# Patient Record
Sex: Male | Born: 1970 | Race: Black or African American | Hispanic: No | Marital: Single | Smoking: Never smoker
Health system: Southern US, Community
[De-identification: ages and names within clinical notes are randomized; demographics above are authoritative.]

## PROBLEM LIST (undated history)

## (undated) DIAGNOSIS — J189 Pneumonia, unspecified organism: Secondary | ICD-10-CM

## (undated) DIAGNOSIS — C220 Liver cell carcinoma: Secondary | ICD-10-CM

## (undated) DIAGNOSIS — B1920 Unspecified viral hepatitis C without hepatic coma: Secondary | ICD-10-CM

## (undated) DIAGNOSIS — K7460 Unspecified cirrhosis of liver: Secondary | ICD-10-CM

## (undated) HISTORY — DX: Unspecified cirrhosis of liver: K74.60

## (undated) HISTORY — DX: Unspecified viral hepatitis C without hepatic coma: B19.20

## (undated) HISTORY — DX: Liver cell carcinoma: C22.0

## (undated) HISTORY — DX: Pneumonia, unspecified organism: J18.9

---

## 2007-11-25 ENCOUNTER — Ambulatory Visit: Payer: Self-pay | Admitting: Internal Medicine

## 2007-11-25 DIAGNOSIS — K029 Dental caries, unspecified: Secondary | ICD-10-CM | POA: Insufficient documentation

## 2007-11-27 ENCOUNTER — Encounter (INDEPENDENT_AMBULATORY_CARE_PROVIDER_SITE_OTHER): Payer: Self-pay | Admitting: Internal Medicine

## 2007-12-01 ENCOUNTER — Encounter (INDEPENDENT_AMBULATORY_CARE_PROVIDER_SITE_OTHER): Payer: Self-pay | Admitting: Internal Medicine

## 2009-08-19 ENCOUNTER — Emergency Department (HOSPITAL_COMMUNITY): Admission: EM | Admit: 2009-08-19 | Discharge: 2009-08-19 | Payer: Self-pay | Admitting: Emergency Medicine

## 2010-09-26 NOTE — Letter (Signed)
Summary: RECEIVED RECORDS FROM [facility]  RECEIVED RECORDS FROM [facility]   Imported By: Arta Bruce 04/13/2010 15:17:50  _____________________________________________________________________  External Attachment:    Type:   Image     Comment:   External Document

## 2010-11-27 LAB — BASIC METABOLIC PANEL WITH GFR
BUN: 8 mg/dL (ref 6–23)
CO2: 25 meq/L (ref 19–32)
Calcium: 8.2 mg/dL — ABNORMAL LOW (ref 8.4–10.5)
Chloride: 107 meq/L (ref 96–112)
Creatinine, Ser: 0.74 mg/dL (ref 0.4–1.5)
GFR calc non Af Amer: >60 mL/min
Glucose, Bld: 125 mg/dL — ABNORMAL HIGH (ref 70–99)
Potassium: 3.9 meq/L (ref 3.5–5.1)
Sodium: 142 meq/L (ref 135–145)

## 2010-11-27 LAB — URINALYSIS, ROUTINE W REFLEX MICROSCOPIC
Bilirubin Urine: NEGATIVE
Glucose, UA: NEGATIVE mg/dL
Hgb urine dipstick: NEGATIVE
Nitrite: NEGATIVE
Specific Gravity, Urine: 1.042 — ABNORMAL HIGH (ref 1.005–1.030)
pH: 5.5 (ref 5.0–8.0)

## 2010-11-27 LAB — CBC
Hemoglobin: 16.6 g/dL (ref 13.0–17.0)
MCHC: 34.8 g/dL (ref 30.0–36.0)
RBC: 5.18 MIL/uL (ref 4.22–5.81)

## 2010-11-27 LAB — POCT CARDIAC MARKERS
CKMB, poc: 4.9 ng/mL (ref 1.0–8.0)
Troponin i, poc: <0.05 ng/mL (ref 0.00–0.09)

## 2013-05-28 ENCOUNTER — Ambulatory Visit: Payer: BC Managed Care – PPO | Admitting: Family Medicine

## 2013-05-28 ENCOUNTER — Other Ambulatory Visit: Payer: Self-pay | Admitting: Family Medicine

## 2013-05-28 VITALS — BP 130/80 | HR 56 | Temp 98.0°F | Resp 16 | Ht 64.0 in | Wt 138.0 lb

## 2013-05-28 DIAGNOSIS — Z0000 Encounter for general adult medical examination without abnormal findings: Secondary | ICD-10-CM

## 2013-05-28 DIAGNOSIS — R7989 Other specified abnormal findings of blood chemistry: Secondary | ICD-10-CM

## 2013-05-28 LAB — LIPID PANEL
HDL: 78 mg/dL (ref >39)
LDL Cholesterol: 102 mg/dL — ABNORMAL HIGH (ref 0–99)
Total CHOL/HDL Ratio: 2.8 Ratio
VLDL: 37 mg/dL (ref 0–40)

## 2013-05-28 LAB — POCT CBC
HCT, POC: 51.1 % (ref 43.5–53.7)
Hemoglobin: 16.6 g/dL (ref 14.1–18.1)
Lymph, poc: 2.1 (ref 0.6–3.4)
MCH, POC: 31.7 pg — AB (ref 27–31.2)
MCHC: 32.5 g/dL (ref 31.8–35.4)
MCV: 97.7 fL — AB (ref 80–97)
POC MID %: 7.1 %M (ref 0–12)
WBC: 3.7 10*3/uL — AB (ref 4.6–10.2)

## 2013-05-28 LAB — COMPREHENSIVE METABOLIC PANEL
ALT: 125 U/L — ABNORMAL HIGH (ref 0–53)
Alkaline Phosphatase: 103 U/L (ref 39–117)
Sodium: 136 mEq/L (ref 135–145)
Total Bilirubin: 0.6 mg/dL (ref 0.3–1.2)
Total Protein: 8.2 g/dL (ref 6.0–8.3)

## 2013-05-28 NOTE — Progress Notes (Signed)
Annual physical examination:  42 year old man who is here for a physical examination. He has been in the [place] for about 6 years has not had a physical examination. He has no acute medical complaints  Past medical history: Major medical illnesses: None Regular medications: None Surgical illnesses: None Medical allergies: None   Family history: Mother was killed in the [place]. Father is still living, believe in [place].   Social history: Patient works at [employer]. He grew up in [place], lives in the [place], [place], and [place] before coming to the [place]. He is single and does not have children. He gets a lot of exercise at 2 jobs and working out some.  Review of systems: Constitutional: Unremarkable HEENT: Unremarkable Pressure: Unremarkable Cardiovascular: Unremarkable Genitourinary: Unremarkable Gastrointestinal: Unremarkable Muscle skeletal: Unremarkable Dermatologic: Unremarkable Endocrinologic: Neurologic: Unremarkable  Physical examination: Well-developed African American male in no acute distress. His TMs are normal. Eyes PERRLA. Fundi benign. Throat clear. Has some dental.. Neck supple without nodes thyromegaly. Chest is clear to auscultation. Heart regular without murmurs gallops or arrhythmias. And soft the mass or tenderness. Normal male external genitalia, and surface, no abnormalities noted. Testes descended. No hernias. Extremities unremarkable. Skin unremarkable.  Assessment: Normal physical examination  Plan: Check labs No other major advice or concerns today.         Results for orders placed in visit on 05/28/13  POCT CBC      Result Value Range   WBC 3.7 (*) 4.6 - 10.2 K/uL   Lymph, poc 2.1  0.6 - 3.4   POC LYMPH PERCENT 57.1 (*) 10 - 50 %L   MID (cbc) 0.3  0 - 0.9   POC MID % 7.1  0 - 12 %M   POC Granulocyte 1.3 (*) 2 - 6.9   Granulocyte percent 35.8 (*) 37 - 80 %G   RBC 5.23  4.69 - 6.13 M/uL   Hemoglobin 16.6   14.1 - 18.1 g/dL   HCT, POC 81.1  91.4 - 53.7 %   MCV 97.7 (*) 80 - 97 fL   MCH, POC 31.7 (*) 27 - 31.2 pg   MCHC 32.5  31.8 - 35.4 g/dL   RDW, POC 78.2     Platelet Count, POC 110 (*) 142 - 424 K/uL   MPV 10.2  0 - 99.8 fL

## 2013-05-28 NOTE — Patient Instructions (Signed)
I will either send you a letter or have someone call you regarding the rest of your laboratory tests.

## 2013-06-02 LAB — HEPATITIS B SURFACE ANTIGEN: Hepatitis B Surface Ag: NEGATIVE

## 2013-06-03 LAB — HEPATITIS A ANTIBODY, IGM: Hep A IgM: NEGATIVE

## 2013-06-03 LAB — HEPATITIS A ANTIBODY, TOTAL: Hep A Total Ab: NEGATIVE

## 2013-12-24 ENCOUNTER — Emergency Department (HOSPITAL_COMMUNITY)
Admission: EM | Admit: 2013-12-24 | Discharge: 2013-12-24 | Disposition: A | Discharge status: Home / Self Care | Payer: Self-pay | Attending: Emergency Medicine | Admitting: Emergency Medicine

## 2013-12-24 ENCOUNTER — Encounter (HOSPITAL_COMMUNITY): Payer: Self-pay | Admitting: Emergency Medicine

## 2013-12-24 DIAGNOSIS — R6889 Other general symptoms and signs: Secondary | ICD-10-CM

## 2013-12-24 DIAGNOSIS — S01502A Unspecified open wound of oral cavity, initial encounter: Secondary | ICD-10-CM | POA: Insufficient documentation

## 2013-12-24 DIAGNOSIS — M545 Low back pain, unspecified: Secondary | ICD-10-CM | POA: Insufficient documentation

## 2013-12-24 DIAGNOSIS — W503XXA Accidental bite by another person, initial encounter: Secondary | ICD-10-CM | POA: Insufficient documentation

## 2013-12-24 DIAGNOSIS — Y9289 Other specified places as the place of occurrence of the external cause: Secondary | ICD-10-CM | POA: Insufficient documentation

## 2013-12-24 DIAGNOSIS — G259 Extrapyramidal and movement disorder, unspecified: Secondary | ICD-10-CM | POA: Insufficient documentation

## 2013-12-24 DIAGNOSIS — R51 Headache: Secondary | ICD-10-CM | POA: Insufficient documentation

## 2013-12-24 DIAGNOSIS — M549 Dorsalgia, unspecified: Secondary | ICD-10-CM

## 2013-12-24 DIAGNOSIS — Q759 Congenital malformation of skull and face bones, unspecified: Secondary | ICD-10-CM | POA: Insufficient documentation

## 2013-12-24 DIAGNOSIS — Y9389 Activity, other specified: Secondary | ICD-10-CM | POA: Insufficient documentation

## 2013-12-24 LAB — CBC WITH DIFFERENTIAL/PLATELET
BASOS ABS: 0 10*3/uL (ref 0.0–0.1)
Basophils Relative: 0 % (ref 0–1)
EOS ABS: 0 10*3/uL (ref 0.0–0.7)
EOS PCT: 0 % (ref 0–5)
HEMATOCRIT: 41.6 % (ref 39.0–52.0)
Hemoglobin: 14.8 g/dL (ref 13.0–17.0)
LYMPHS ABS: 1.4 10*3/uL (ref 0.7–4.0)
LYMPHS PCT: 43 % (ref 12–46)
MCH: 30.8 pg (ref 26.0–34.0)
MCHC: 35.6 g/dL (ref 30.0–36.0)
MCV: 86.5 fL (ref 78.0–100.0)
MONO ABS: 0.3 10*3/uL (ref 0.1–1.0)
Monocytes Relative: 8 % (ref 3–12)
Neutro Abs: 1.6 10*3/uL — ABNORMAL LOW (ref 1.7–7.7)
Neutrophils Relative %: 48 % (ref 43–77)
Platelets: 113 10*3/uL — ABNORMAL LOW (ref 150–400)
RBC: 4.81 MIL/uL (ref 4.22–5.81)
RDW: 13.1 % (ref 11.5–15.5)
WBC: 3.3 10*3/uL — AB (ref 4.0–10.5)

## 2013-12-24 LAB — BASIC METABOLIC PANEL
BUN: 13 mg/dL (ref 6–23)
CO2: 25 mEq/L (ref 19–32)
Calcium: 9.4 mg/dL (ref 8.4–10.5)
Chloride: 96 mEq/L (ref 96–112)
Creatinine, Ser: 0.63 mg/dL (ref 0.50–1.35)
GFR calc Af Amer: >90 mL/min (ref >90)
GFR calc non Af Amer: >90 mL/min (ref >90)
Glucose, Bld: 117 mg/dL — ABNORMAL HIGH (ref 70–99)
Potassium: 4.4 mEq/L (ref 3.7–5.3)
Sodium: 133 mEq/L — ABNORMAL LOW (ref 137–147)

## 2013-12-24 LAB — URINALYSIS, ROUTINE W REFLEX MICROSCOPIC
Bilirubin Urine: NEGATIVE
Glucose, UA: NEGATIVE mg/dL
Hgb urine dipstick: NEGATIVE
Ketones, ur: NEGATIVE mg/dL
Leukocytes, UA: NEGATIVE
Nitrite: NEGATIVE
Protein, ur: NEGATIVE mg/dL
Specific Gravity, Urine: 1.015 (ref 1.005–1.030)
Urobilinogen, UA: 1 mg/dL (ref 0.0–1.0)
pH: 6.5 (ref 5.0–8.0)

## 2013-12-24 MED ORDER — SODIUM CHLORIDE 0.9 % IV BOLUS (SEPSIS)
500.0000 mL | Freq: Once | INTRAVENOUS | Status: AC
  Administered 2013-12-24: 500 mL via INTRAVENOUS

## 2013-12-24 MED ORDER — DIPHENHYDRAMINE HCL 25 MG PO TABS: 50.0000 mg | ORAL_TABLET | ORAL | Status: DC | PRN

## 2013-12-24 MED ORDER — IBUPROFEN 800 MG PO TABS: 800.0000 mg | ORAL_TABLET | Freq: Three times a day (TID) | ORAL | Status: DC

## 2013-12-24 MED ORDER — DIPHENHYDRAMINE HCL 50 MG/ML IJ SOLN
50.0000 mg | Freq: Once | INTRAMUSCULAR | Status: AC
  Administered 2013-12-24: 50 mg via INTRAVENOUS
  Filled 2013-12-24: qty 1

## 2013-12-24 NOTE — ED Notes (Signed)
Bed: UY40 Expected date:  Expected time:  Means of arrival:  Comments: EMS- back pain, seizure like activity

## 2013-12-24 NOTE — Discharge Instructions (Signed)
Back Pain, Adult Low back pain is very common. About 1 in 5 people have back pain.The cause of low back pain is rarely dangerous. The pain often gets better over time.About half of people with a sudden onset of back pain feel better in just 2 weeks. About 8 in 10 people feel better by 6 weeks.  CAUSES Some common causes of back pain include:  Strain of the muscles or ligaments supporting the spine.  Wear and tear (degeneration) of the spinal discs.  Arthritis.  Direct injury to the back. DIAGNOSIS Most of the time, the direct cause of low back pain is not known.However, back pain can be treated effectively even when the exact cause of the pain is unknown.Answering your caregiver's questions about your overall health and symptoms is one of the most accurate ways to make sure the cause of your pain is not dangerous. If your caregiver needs more information, he or she may order lab work or imaging tests (X-rays or MRIs).However, even if imaging tests show changes in your back, this usually does not require surgery. HOME CARE INSTRUCTIONS For many people, back pain returns.Since low back pain is rarely dangerous, it is often a condition that people can learn to [facility] their own.   Remain active. It is stressful on the back to sit or stand in one place. Do not sit, drive, or stand in one place for more than 30 minutes at a time. Take short walks on level surfaces as soon as pain allows.Try to increase the length of time you walk each day.  Do not stay in bed.Resting more than 1 or 2 days can delay your recovery.  Do not avoid exercise or work.Your body is made to move.It is not dangerous to be active, even though your back may hurt.Your back will likely heal faster if you return to being active before your pain is gone.  Pay attention to your body when you bend and lift. Many people have less discomfortwhen lifting if they bend their knees, keep the load close to their bodies,and  avoid twisting. Often, the most comfortable positions are those that put less stress on your recovering back.  Find a comfortable position to sleep. Use a firm mattress and lie on your side with your knees slightly bent. If you lie on your back, put a pillow under your knees.  Only take over-the-counter or prescription medicines as directed by your caregiver. Over-the-counter medicines to reduce pain and inflammation are often the most helpful.Your caregiver may prescribe muscle relaxant drugs.These medicines help dull your pain so you can more quickly return to your normal activities and healthy exercise.  Put ice on the injured area.  Put ice in a plastic bag.  Place a towel between your skin and the bag.  Leave the ice on for 15-20 minutes, 03-04 times a day for the first 2 to 3 days. After that, ice and heat may be alternated to reduce pain and spasms.  Ask your caregiver about trying back exercises and gentle massage. This may be of some benefit.  Avoid feeling anxious or stressed.Stress increases muscle tension and can worsen back pain.It is important to recognize when you are anxious or stressed and learn ways to manage it.Exercise is a great option. SEEK MEDICAL CARE IF:  You have pain that is not relieved with rest or medicine.  You have pain that does not improve in 1 week.  You have new symptoms.  You are generally not feeling well. SEEK  IMMEDIATE MEDICAL CARE IF:   You have pain that radiates from your back into your legs.  You develop new bowel or bladder control problems.  You have unusual weakness or numbness in your arms or legs.  You develop nausea or vomiting.  You develop abdominal pain.  You feel faint. Document Released: 08/13/2005 Document Revised: 02/12/2012 Document Reviewed: 01/01/2011 [facility] Patient Information 2014 [place], [place].  Back Pain, Adulte La lombalgie est trs commun. Environ 1 personne sur 5 ont des PepsiCo. La cause de  douleurs au bas du dos est rarement dangereux. La douleur est souvent mieux au fil du [place]. Environ la moiti des personnes atteintes de l'apparition soudaine de maux de dos se sentir mieux en seulement 2 semaines. Environ 8 personnes sur 10 se sentent mieux en 6 semaines. CAUSES Certaines des causes frquentes de Intel Corporation comprennent: Souche de muscles ou des ligaments soutenant la colonne vertbrale. L'usure (dgnrescence) des disques intervertbraux. Arthrite. Atteinte directe  l'arrire. DIAGNOSTIC La plupart du temps, la cause directe de la lombalgie est pas connue. Toutefois, les maux de The Pepsi tre traite efficacement mme lorsque la cause exacte de la douleur est inconnue. Rpondre aux questions de votre fournisseur de soins de votre sant et symptmes globale est l'un des Yahoo! Inc plus prcis pour Boeing la cause de votre douleur est pas dangereux. Si votre fournisseur de soins a besoin de plus d'informations, il ou elle peut ordonner des travaux ou d'imagerie des tests de laboratoire (de rayons X ou [place]). Cependant, mme si les tests d'imagerie montrent des TEPPCO Partners, ce ne ncessite gnralement pas Scientist, product/process development. INSTRUCTIONS DE SOINS  DOMICILE Pour beaucoup de gens, dos douleur revient. Depuis la lombalgie est rarement dangereux, il est souvent une condition que les gens peuvent apprendre  grer eux-mmes. Restez actif. Il est stressant sur Alcoa Inc rester assis ou debout en un seul endroit. Ne vous asseyez pas, conduire, ou debout dans un endroit pendant plus de 30 minutes  une heure. Prenez de courtes promenades sur des surfaces de niveau ds que la Risk analyst. Essayez d'augmenter la longueur de temps que vous marchez chaque jour. Ne pas rester au lit. Reposant plus de 1 ou 2 jours peut retarder votre rtablissement. Ne pas viter l'exercice ou de travail. Votre corps est fait pour Danaher Corporation. Il est pas dangereux d'tre actif,  mme si votre dos peut blesser. Votre dos sera probablement gurir plus vite si vous revenez  tre Microbiologist a disparu. Faites attention  General Electric corps lorsque vous vous penchez et Economist. Beaucoup de gens ont moins d'inconfort lors de la leve si elles plier les genoux, gardez la charge prs de PG&E Corporation, et d'viter la torsion. Souvent, les positions les plus confortables sont ceux qui mettent moins l'accent sur la rcupration [place]. Trouver une position confortable pour dormir. Utilisez un matelas ferme et se Higher education careers adviser ct, les genoux lgrement plis. Si vous allongez Dollar General, Therapist, occupational un oreiller sous vos genoux. Seulement prendre over-the-counter ou des Raytheon ordonnance comme indiqu par votre fournisseur de [place]. Over-the-counter mdicaments pour rduire Engineer, technical sales et l'inflammation Monsanto Company plus The Kroger. Votre soignant peut prescrire des mdicaments myorelaxants. Ces mdicaments aident terne votre douleur afin que vous puissiez plus rapidement retourner  vos activits normales et exercice sain. Mettre de la Peabody Energy zone [place]. Mettre de Education officer, community un sac plastique. Placez une serviette entre votre peau et  le sac. Laissez la glace pendant 15-20 minutes, 03-04 fois par jour pendant les 2  3 premiers jours. Aprs cela, la glace et la Computer Sciences Corporation tre alterns pour rduire SunTrust et les spasmes. Demandez  votre fournisseur de Oncologist pour Qwest Communications et un massage doux. Cela peut tre d'une certaine utilit. viter de se sentir anxieux ou stress. Le stress augmente la tension musculaire et Ecologist PepsiCo. Il est important de reconnatre quand vous tes anxieux ou stress et apprenez faons de Medical sales representative. L'exercice est une excellente option. Obtenir des soins si: Vous avez des douleurs qui ne sont pas soulage par le repos ou la mdecine. Vous avez des douleurs qui ne amliore pas en 1  semaine. Vous avez de nouveaux symptmes. Vous tes gnralement sentez pas bien. Consulter immdiatement SOINS MDICAUX SI: Vous avez une douleur qui irradie  partir de votre retour dans vos [place]. Vous dveloppez de nouveaux problmes intestinaux ou de contrle de la vessie. Vous avez une faiblesse inhabituelle ou engourdissements dans les bras ou les [place]. Vous dveloppez des [employer] Solutions ou des vomissements. Vous dveloppez des Chesapeake Energy. Vous vous sentez faible. Google Translate for [place]

## 2013-12-24 NOTE — ED Notes (Signed)
Used interpreter phone, pt here bc something in his brain is making him "eat his tongue" and he wants to know what is making him do this.

## 2013-12-24 NOTE — ED Notes (Signed)
Per EMS pt comes from home c/o flank pain since yesterday, headache started today every 30 mins on sides of his head and he cant talk. Pt also has laceration on tongue but not sure when or how that got there.  Pt speaks very little, broken [place]. Pt speaks [place].

## 2013-12-24 NOTE — ED Provider Notes (Signed)
CSN: [id number]     Arrival date & time 12/24/13  1320 History   First MD Initiated Contact with Patient 12/24/13 1338     Chief Complaint  Patient presents with  . Flank Pain  . Headache  . Laceration    tongue     (Consider location/radiation/quality/duration/timing/severity/associated sxs/prior Treatment) HPI Comments: Patient presents to the ER with complaints of spasms around his mouth. Patient reports that he started having back pain yesterday. He took a pain medication for the back yesterday and then again today. After he took the second dose he started noticing that his mouth and jaw were locking up. He has been biting his tongue. Back pain is mild to moderate, constant and worsens with movement. He has never had the facial spasms before.  Patient is a 43 y.o. male presenting with flank pain, headaches, and skin laceration. The history is limited by a language barrier. A language interpreter was used.  Flank Pain Associated symptoms include headaches.  Headache Laceration   History reviewed. No pertinent past medical history. History reviewed. No pertinent past surgical history. No family history on file. History  Substance Use Topics  . Smoking status: Never Smoker   . Smokeless tobacco: Not on file  . Alcohol Use: Yes     Comment: social     Review of Systems  HENT: Negative for dental problem.        Facial pain  Genitourinary: Positive for flank pain.  Neurological: Positive for headaches.  All other systems reviewed and are negative.     Allergies  Review of patient's allergies indicates no known allergies.  Home Medications   Prior to Admission medications   Not on File   BP 145/80  Pulse 72  Temp(Src) 98.2 F (36.8 C) (Oral)  Resp 16  SpO2 99% Physical Exam  Constitutional: He is oriented to person, place, and time. He appears well-developed and well-nourished. He appears distressed.  HENT:  Head: Atraumatic. Macrocephalic.  Right Ear:  Hearing normal.  Left Ear: Hearing normal.  Nose: Nose normal.  Mouth/Throat: Oropharynx is clear and moist and mucous membranes are normal.  Eyes: Conjunctivae and EOM are normal. Pupils are equal, round, and reactive to light.  Neck: Normal range of motion. Neck supple.  Cardiovascular: Regular rhythm, S1 normal and S2 normal.  Exam reveals no gallop and no friction rub.   No murmur heard. Pulmonary/Chest: Effort normal and breath sounds normal. No respiratory distress. He exhibits no tenderness.  Abdominal: Soft. Normal appearance and bowel sounds are normal. There is no hepatosplenomegaly. There is no tenderness. There is no rebound, no guarding, no tenderness at McBurney's point and negative Murphy's sign. No hernia.  Musculoskeletal: Normal range of motion.  Neurological: He is alert and oriented to person, place, and time. He has normal strength. No cranial nerve deficit or sensory deficit. Coordination normal. GCS eye subscore is 4. GCS verbal subscore is 5. GCS motor subscore is 6.  Skin: Skin is warm, dry and intact. No rash noted. No cyanosis.  Psychiatric: He has a normal mood and affect. His speech is normal and behavior is normal. Thought content normal.    ED Course  Procedures (including critical care time) Labs Review Labs Reviewed  CBC WITH DIFFERENTIAL - Abnormal; Notable for the following:    WBC 3.3 (*)    Neutro Abs 1.6 (*)    All other components within normal limits  URINALYSIS, ROUTINE W REFLEX MICROSCOPIC  BASIC METABOLIC PANEL    Imaging  Review No results found.   EKG Interpretation None      MDM   Final diagnoses:  None   Patient presents to the ER with severe spasm of the muscles of his face after taking an unknown medication. His examination was consistent with extrapyramidal complications. Patient was admitted IV fluids and Benadryl with resolution of symptoms. Electrolytes and remainder of the workup was unremarkable.  Patient's back pain is  of unclear etiology, but not concerning. He has normal strength, sensation reflexes in the lower extremities. Will prescribe appropriate medication for use, rest, limited lifting.    Orpah Greek, MD 12/24/13 [phone]

## 2013-12-24 NOTE — ED Notes (Signed)
Pt will start making loud noises and get anxious and restless in the bed.  Pt also has like lock jaw and started chewing on his tongue and teeth grinding. Then after few minutes it will cease and pt will relax.

## 2018-12-23 ENCOUNTER — Encounter (HOSPITAL_COMMUNITY): Payer: Self-pay

## 2018-12-23 ENCOUNTER — Ambulatory Visit (HOSPITAL_COMMUNITY)
Admission: EM | Admit: 2018-12-23 | Discharge: 2018-12-23 | Disposition: A | Discharge status: Home / Self Care | Payer: Self-pay | Attending: Family Medicine | Admitting: Family Medicine

## 2018-12-23 ENCOUNTER — Other Ambulatory Visit: Payer: Self-pay

## 2018-12-23 DIAGNOSIS — R112 Nausea with vomiting, unspecified: Secondary | ICD-10-CM

## 2018-12-23 MED ORDER — ONDANSETRON 4 MG PO TBDP
ORAL_TABLET | ORAL | Status: AC
  Filled 2018-12-23: qty 1

## 2018-12-23 MED ORDER — ONDANSETRON 4 MG PO TBDP: 4.0000 mg | ORAL_TABLET | Freq: Three times a day (TID) | ORAL | 0 refills | Status: DC | PRN

## 2018-12-23 MED ORDER — ONDANSETRON 4 MG PO TBDP
4.0000 mg | ORAL_TABLET | Freq: Once | ORAL | Status: AC
  Administered 2018-12-23: 19:00:00 4 mg via ORAL

## 2018-12-23 NOTE — ED Triage Notes (Signed)
Pt speaks poor english but states he threw up earlier today

## 2018-12-23 NOTE — ED Provider Notes (Signed)
[facility]    CSN: [id number] Arrival date & time: 12/23/18  1750     History   Chief Complaint Chief Complaint  Patient presents with  . Emesis    HPI Patrick Tran interpretation via [place] is a 48 y.o. male. No contributing PMH presenting today for evaluation of nausea and vomiting. Symptoms began today. He has had difficulty holding food down despite having an appetite still. Denies associated abdominal pain, diarrhea or constipation. Denies associated URI symptoms. Denies cough, shortness of breath or chest discomfort. Has also had hot/cold chills and sweating. Subjective fevers, no measured fever. Denies sick close contact or with similar symptoms. He has felt slightly dizzy. Denies headache, vision changes.   Works for [employer].   HPI  History reviewed. No pertinent past medical history.  Patient Active Problem List   Diagnosis Date Noted  . DENTAL CARIES 11/25/2007    History reviewed. No pertinent surgical history.     Home Medications    Prior to Admission medications   Medication Sig Start Date End Date Taking? Authorizing Provider  diphenhydrAMINE (BENADRYL) 25 MG tablet Take 2 tablets (50 mg total) by mouth every 4 (four) hours as needed (facial spasm). 12/24/13   Orpah Greek, MD  ibuprofen (ADVIL,MOTRIN) 800 MG tablet Take 1 tablet (800 mg total) by mouth 3 (three) times daily. 12/24/13   Orpah Greek, MD  ondansetron (ZOFRAN ODT) 4 MG disintegrating tablet Take 1 tablet (4 mg total) by mouth every 8 (eight) hours as needed for nausea or vomiting. 12/23/18   Wieters, Elesa Hacker, PA-C    Family History Family History  Family history unknown: Yes    Social History Social History   Tobacco Use  . Smoking status: Never Smoker  Substance Use Topics  . Alcohol use: Yes    Comment: social   . Drug use: No     Allergies   Patient has no known allergies.   Review of Systems Review of  Systems  Constitutional: Positive for chills. Negative for activity change, appetite change, fatigue and fever.  HENT: Negative for congestion, ear pain, rhinorrhea, sinus pressure, sore throat and trouble swallowing.   Eyes: Negative for discharge and redness.  Respiratory: Negative for cough, chest tightness and shortness of breath.   Cardiovascular: Negative for chest pain.  Gastrointestinal: Positive for nausea and vomiting. Negative for abdominal pain and diarrhea.  Musculoskeletal: Negative for myalgias.  Skin: Negative for rash.  Neurological: Positive for dizziness. Negative for light-headedness and headaches.     Physical Exam Triage Vital Signs ED Triage Vitals  Enc Vitals Group     BP 12/23/18 1809 (!) 150/83     Pulse Rate 12/23/18 1809 64     Resp --      Temp 12/23/18 1809 97.6 F (36.4 C)     Temp src --      SpO2 12/23/18 1809 100 %     Weight --      Height --      Head Circumference --      Peak Flow --      Pain Score 12/23/18 1810 0     Pain Loc --      Pain Edu? --      Excl. in [place]? --    No data found.  Updated Vital Signs BP (!) 150/83   Pulse 64   Temp 97.6 F (36.4 C)   SpO2 100%   Visual Acuity Right Eye Distance:  Left Eye Distance:   Bilateral Distance:    Right Eye Near:   Left Eye Near:    Bilateral Near:     Physical Exam Vitals signs and nursing note reviewed.  Constitutional:      Appearance: He is well-developed.     Comments: Patient appears uncomfortable and diaphoretic. At end of visit he appeared more comfortable and in no acute distress.   HENT:     Head: Normocephalic and atraumatic.     Mouth/Throat:     Comments: Oral mucosa pink and moist, no tonsillar enlargement or exudate. Posterior pharynx patent and nonerythematous, no uvula deviation or swelling. Normal phonation.  Eyes:     Conjunctiva/sclera: Conjunctivae normal.  Neck:     Musculoskeletal: Neck supple.  Cardiovascular:     Rate and Rhythm: Normal  rate and regular rhythm.     Heart sounds: No murmur.  Pulmonary:     Effort: Pulmonary effort is normal. No respiratory distress.     Breath sounds: Normal breath sounds.     Comments: Breathing comfortably at rest, CTABL, no wheezing, rales or other adventitious sounds auscultated Abdominal:     Palpations: Abdomen is soft.     Tenderness: There is no abdominal tenderness.     Comments: Soft, nondistended, nontender to light and deep palpation throughout abdomen, no masses palpated.   Skin:    General: Skin is warm and dry.  Neurological:     Mental Status: He is alert.      UC Treatments / Results  Labs (all labs ordered are listed, but only abnormal results are displayed) Labs Reviewed - No data to display  EKG None  Radiology No results found.  Procedures Procedures (including critical care time)  Medications Ordered in UC Medications  ondansetron (ZOFRAN-ODT) disintegrating tablet 4 mg (4 mg Oral Given 12/23/18 1851)    Initial Impression / Assessment and Plan / UC Course  I have reviewed the triage vital signs and the nursing notes.  Pertinent labs & imaging results that were available during my care of the patient were reviewed by me and considered in my medical decision making (see chart for details).     Zofran provided in clinic which significantly improved nausea and discomfort prior to discharge. No associated URI symptoms at this time suggesting COVID, but day 1 of symptoms. Likely viral, possible gastroenteritis. Do not suspect underlying cardiac etiology, neg risk factors and no chest pain. Recommending symptomatic and supportive care with close monitoring. Zofran prescribed, push fluids. Monitor for development of fever, abdominal pain, dehydration signs, cough/shortness of breath. Discussed strict return precautions. Patient verbalized understanding and is agreeable with plan.'  Final Clinical Impressions(s) / UC Diagnoses   Final diagnoses:   Non-intractable vomiting with nausea, unspecified vomiting type     Discharge Instructions     .Your nausea, vomiting, and diarrhea appear to have a viral cause. Your symptoms should improve over the next week as your body continues to rid the infectious cause.  For nausea: Zofran prescribed. Begin with every 6 hours, than as you are able to hold food down, take it as needed. Start with clear liquids, then move to plain foods like bananas, rice, applesauce, toast, broth, grits, oatmeal. As those food settle okay you may transition to your normal foods. Avoid spicy and greasy foods as much as possible.  For Diarrhea: This is your body's natural way of getting rid of a virus. You may try taking 1 imodium to decrease amount of stools  a day, but we do not want you to stop your diarrhea.   Preventing dehydration is key! You need to replace the fluid your body is expelling. Drink plenty of fluids, may use Pedialyte or sports drinks.   Please return if you are experiencing blood in your vomit or stool or experiencing dizziness, lightheadedness, extreme fatigue, increased abdominal pain.     ED Prescriptions    Medication Sig Dispense Auth. Provider   ondansetron (ZOFRAN ODT) 4 MG disintegrating tablet Take 1 tablet (4 mg total) by mouth every 8 (eight) hours as needed for nausea or vomiting. 20 tablet Wieters, [place] C, PA-C     Controlled Substance Prescriptions [place] Controlled Substance Registry consulted? Not Applicable   Patrick Tran, [place] 12/24/18 1043

## 2018-12-23 NOTE — Discharge Instructions (Addendum)
.  Your nausea, vomiting, and diarrhea appear to have a viral cause. Your symptoms should improve over the next week as your body continues to rid the infectious cause.  For nausea: Zofran prescribed. Begin with every 6 hours, than as you are able to hold food down, take it as needed. Start with clear liquids, then move to plain foods like bananas, rice, applesauce, toast, broth, grits, oatmeal. As those food settle okay you may transition to your normal foods. Avoid spicy and greasy foods as much as possible.  For Diarrhea: This is your body's natural way of getting rid of a virus. You may try taking 1 imodium to decrease amount of stools a day, but we do not want you to stop your diarrhea.   Preventing dehydration is key! You need to replace the fluid your body is expelling. Drink plenty of fluids, may use Pedialyte or sports drinks.   Please return if you are experiencing blood in your vomit or stool or experiencing dizziness, lightheadedness, extreme fatigue, increased abdominal pain.

## 2019-01-13 ENCOUNTER — Encounter (HOSPITAL_COMMUNITY): Payer: Self-pay | Admitting: Emergency Medicine

## 2019-01-13 ENCOUNTER — Other Ambulatory Visit: Payer: Self-pay

## 2019-01-13 ENCOUNTER — Ambulatory Visit (HOSPITAL_COMMUNITY)
Admission: EM | Admit: 2019-01-13 | Discharge: 2019-01-13 | Disposition: A | Discharge status: Home / Self Care | Payer: PRIVATE HEALTH INSURANCE | Attending: Family Medicine | Admitting: Family Medicine

## 2019-01-13 DIAGNOSIS — M545 Low back pain, unspecified: Secondary | ICD-10-CM

## 2019-01-13 NOTE — ED Triage Notes (Signed)
Pt here for back pain and needs work note

## 2019-01-13 NOTE — ED Provider Notes (Signed)
[place]   [id number] 01/13/19 Arrival Time: [place]:  1. Acute bilateral low back pain without sciatica    Pain resolving. Supposed to return to work on 01/16/2019; work note provided.  Able to ambulate here and hemodynamically stable. No indication for imaging of back at this time given no trauma and normal neurological exam.   OTC analgesics if needed. Encourage ROM/movement as tolerated.  Follow-up Information    [facility].   Specialty:  Urgent Care Why:  As needed. Contact information: [place] [phone]          Reviewed expectations re: course of current medical issues. Questions answered. Outlined signs and symptoms indicating need for more acute intervention. Patient verbalized understanding. After Visit Summary given.   SUBJECTIVE: History from: patient.  Audio interpreter used but poor connection quality.  Trendon Dripps is a 48 y.o. male who presents with complaint of intermittent bilateral lower back discomfort. Onset gradual, over the past week. Injury/trama: no, but is very active at work and questions relation; works at [employer]. History of back problems: rare. Discomfort described as aching without radiation. Pain worse after prolonged standing; better with rest. Progressive LE weakness or saddle anesthesia: none. Extremity sensation changes or weakness: none. Ambulatory without difficulty. Normal bowel/bladder habits: yes, without urinary retention. No associated abdominal pain/n/v. Self treatment: has tried nothing for pain relief.  ROS: As per HPI. All other systems negative.   OBJECTIVE:  Vitals:   01/13/19 1744  BP: (!) 179/101  Pulse: 97  Resp: 18  Temp: 98 F (36.7 C)  TempSrc: Oral  SpO2: 97%    General appearance: alert; no distress Neck: supple with FROM; without midline tenderness CV: RRR Lungs: unlabored  respirations; symmetrical air entry Abdomen: soft, non-tender; non-distended Back: mild bilateral tenderness of his lower paraspinal musculature; FROM at waist; bruising: none; without midline tenderness Extremities: no edema; symmetrical with no gross deformities; normal ROM of bilateral lower extremities Skin: warm and dry Neurologic: normal gait; normal reflexes of RLE and LLE; normal sensation of RLE and LLE; normal strength of RLE and LLE Psychological: alert and cooperative; normal mood and affect  No Known Allergies   Social History   Socioeconomic History  . Marital status: Single    Spouse name: Not on file  . Number of children: Not on file  . Years of education: Not on file  . Highest education level: Not on file  Occupational History  . Not on file  Social Needs  . Financial resource strain: Not on file  . Food insecurity:    Worry: Not on file    Inability: Not on file  . Transportation needs:    Medical: Not on file    Non-medical: Not on file  Tobacco Use  . Smoking status: Never Smoker  Substance and Sexual Activity  . Alcohol use: Yes    Comment: social   . Drug use: No  . Sexual activity: Yes  Lifestyle  . Physical activity:    Days per week: Not on file    Minutes per session: Not on file  . Stress: Not on file  Relationships  . Social connections:    Talks on phone: Not on file    Gets together: Not on file    Attends religious service: Not on file    Active member of club or organization: Not on file    Attends meetings of  clubs or organizations: Not on file    Relationship status: Not on file  . Intimate partner violence:    Fear of current or ex partner: Not on file    Emotionally abused: Not on file    Physically abused: Not on file    Forced sexual activity: Not on file  Other Topics Concern  . Not on file  Social History Narrative  . Not on file   Family History  Family history unknown: Yes   History reviewed. No pertinent  surgical history.   Vanessa Kick, MD 01/14/19 [phone]

## 2019-01-26 ENCOUNTER — Ambulatory Visit (HOSPITAL_COMMUNITY)
Admission: EM | Admit: 2019-01-26 | Discharge: 2019-01-26 | Disposition: A | Discharge status: Home / Self Care | Payer: PRIVATE HEALTH INSURANCE | Attending: Family Medicine | Admitting: Family Medicine

## 2019-01-26 ENCOUNTER — Encounter (HOSPITAL_COMMUNITY): Payer: Self-pay | Admitting: Emergency Medicine

## 2019-01-26 ENCOUNTER — Other Ambulatory Visit: Payer: Self-pay

## 2019-01-26 DIAGNOSIS — G8929 Other chronic pain: Secondary | ICD-10-CM | POA: Diagnosis not present

## 2019-01-26 DIAGNOSIS — M546 Pain in thoracic spine: Secondary | ICD-10-CM

## 2019-01-26 DIAGNOSIS — S39012A Strain of muscle, fascia and tendon of lower back, initial encounter: Secondary | ICD-10-CM | POA: Diagnosis not present

## 2019-01-26 NOTE — ED Triage Notes (Signed)
Patient has had back pain since 5/19.  Patient is here today for a work note.  Back is hurting

## 2019-01-26 NOTE — ED Provider Notes (Signed)
[facility]    CSN: [id number] Arrival date & time: 01/26/19  1556     History   Chief Complaint Chief Complaint  Patient presents with  . Letter for School/Work  . Back Pain    HPI Patrick Tran is a 48 y.o. male.   Patient has had back pain since 5/19.  Patient is here today for a work note.  Back is hurting.  He has not been sleeping well and he has diffuse thoracic aching and paralumbar aching.  Originally from [place].  He works as a Geophysicist/field seismologist.   Note from 01/13/2019: Patrick Tran is a 48 y.o. male who presents with complaint of intermittent bilateral lower back discomfort. Onset gradual, over the past week. Injury/trama: no, but is very active at work and questions relation; works at [employer]. History of back problems: rare. Discomfort described as aching without radiation. Pain worse after prolonged standing; better with rest. Progressive LE weakness or saddle anesthesia: none. Extremity sensation changes or weakness: none. Ambulatory without difficulty. Normal bowel/bladder habits: yes, without urinary retention. No associated abdominal pain/n/v. Self treatment: has tried nothing for pain relief.      History reviewed. No pertinent past medical history.  Patient Active Problem List   Diagnosis Date Noted  . DENTAL CARIES 11/25/2007    History reviewed. No pertinent surgical history.     Home Medications    Prior to Admission medications   Not on File    Family History Family History  Family history unknown: Yes    Social History Social History   Tobacco Use  . Smoking status: Never Smoker  Substance Use Topics  . Alcohol use: Yes    Comment: social   . Drug use: No     Allergies   Patient has no known allergies.   Review of Systems Review of Systems   Physical Exam Triage Vital Signs ED Triage Vitals [01/26/19 1612]  Enc Vitals Group     BP      Pulse      Resp      Temp      Temp src      SpO2    Weight      Height      Head Circumference      Peak Flow      Pain Score 6     Pain Loc      Pain Edu?      Excl. in [place]?    No data found.  Updated Vital Signs BP 134/73 (BP Location: Left Arm)   Pulse 63   Temp 98.5 F (36.9 C) (Oral)   Resp 18   SpO2 97%    Physical Exam Vitals signs and nursing note reviewed.  Constitutional:      Appearance: Normal appearance.  Eyes:     Conjunctiva/sclera: Conjunctivae normal.  Cardiovascular:     Rate and Rhythm: Normal rate.     Heart sounds: Normal heart sounds.  Pulmonary:     Effort: Pulmonary effort is normal.     Breath sounds: Normal breath sounds.  Musculoskeletal: Normal range of motion.        General: Tenderness present.     Comments: Mild paralumbar and paraspinal tenderness  Skin:    General: Skin is warm and dry.  Neurological:     General: No focal deficit present.     Mental Status: He is alert and oriented to person, place, and time.  Psychiatric:  Mood and Affect: Mood normal.      UC Treatments / Results  Labs (all labs ordered are listed, but only abnormal results are displayed) Labs Reviewed - No data to display  EKG None  Radiology No results found.  Procedures Procedures (including critical care time)  Medications Ordered in UC Medications - No data to display  Initial Impression / Assessment and Plan / UC Course  I have reviewed the triage vital signs and the nursing notes.  Pertinent labs & imaging results that were available during my care of the patient were reviewed by me and considered in my medical decision making (see chart for details).    Final Clinical Impressions(s) / UC Diagnoses   Final diagnoses:  Strain of lumbar region, initial encounter  Chronic right-sided thoracic back pain   Discharge Instructions   None    ED Prescriptions    None     Controlled Substance Prescriptions [place] Controlled Substance Registry consulted? Not Applicable   Robyn Haber, MD 01/26/19 1623

## 2019-03-31 ENCOUNTER — Encounter (HOSPITAL_COMMUNITY): Payer: Self-pay | Admitting: Emergency Medicine

## 2019-03-31 ENCOUNTER — Ambulatory Visit (HOSPITAL_COMMUNITY)
Admission: EM | Admit: 2019-03-31 | Discharge: 2019-03-31 | Disposition: A | Discharge status: Home / Self Care | Payer: PRIVATE HEALTH INSURANCE | Attending: Internal Medicine | Admitting: Internal Medicine

## 2019-03-31 DIAGNOSIS — Z0289 Encounter for other administrative examinations: Secondary | ICD-10-CM

## 2019-03-31 NOTE — ED Provider Notes (Signed)
[facility]    CSN: [id number] Arrival date & time: 03/31/19  1635      History   Chief Complaint Chief Complaint  Patient presents with  . Letter for School/Work    HPI Alexie Sisney is a 48 y.o. male with no past medical history comes to the urgent care requesting a work note to stay home for 3 days because he works too hard.   Patient has chronic back pain which is unchanged.  No numbness or tingling.  No fever or chills.  HPI  History reviewed. No pertinent past medical history.  Patient Active Problem List   Diagnosis Date Noted  . DENTAL CARIES 11/25/2007    History reviewed. No pertinent surgical history.     Home Medications    Prior to Admission medications   Not on File    Family History Family History  Family history unknown: Yes    Social History Social History   Tobacco Use  . Smoking status: Never Smoker  Substance Use Topics  . Alcohol use: Yes    Comment: social   . Drug use: No     Allergies   Patient has no known allergies.   Review of Systems Review of Systems  Constitutional: Positive for fatigue. Negative for activity change, chills, diaphoresis, fever and unexpected weight change.  Respiratory: Negative.   Cardiovascular: Negative.   Gastrointestinal: Negative.   Genitourinary: Negative.   Musculoskeletal: Negative.   Neurological: Negative.      Physical Exam Triage Vital Signs ED Triage Vitals  Enc Vitals Group     BP 03/31/19 1649 (!) 164/107     Pulse Rate 03/31/19 1648 99     Resp 03/31/19 1648 18     Temp 03/31/19 1648 98.5 F (36.9 C)     Temp src --      SpO2 --      Weight --      Height --      Head Circumference --      Peak Flow --      Pain Score --      Pain Loc --      Pain Edu? --      Excl. in [place]? --    No data found.  Updated Vital Signs BP (!) 164/107   Pulse 99   Temp 98.5 F (36.9 C)   Resp 18   Visual Acuity Right Eye Distance:   Left Eye Distance:    Bilateral Distance:    Right Eye Near:   Left Eye Near:    Bilateral Near:     Physical Exam Constitutional:      General: He is not in acute distress.    Appearance: Normal appearance. He is not ill-appearing or toxic-appearing.  Neck:     Musculoskeletal: Normal range of motion and neck supple.  Cardiovascular:     Rate and Rhythm: Normal rate and regular rhythm.     Pulses: Normal pulses.     Heart sounds: Normal heart sounds.  Pulmonary:     Effort: Pulmonary effort is normal.     Breath sounds: Normal breath sounds.  Abdominal:     General: Bowel sounds are normal.     Palpations: Abdomen is soft.  Musculoskeletal: Normal range of motion.  Skin:    Capillary Refill: Capillary refill takes less than 2 seconds.  Neurological:     Mental Status: He is alert.      UC Treatments / Results  Labs (  all labs ordered are listed, but only abnormal results are displayed) Labs Reviewed - No data to display  EKG   Radiology No results found.  Procedures Procedures (including critical care time)  Medications Ordered in UC Medications - No data to display  Initial Impression / Assessment and Plan / UC Course  I have reviewed the triage vital signs and the nursing notes.  Pertinent labs & imaging results that were available during my care of the patient were reviewed by me and considered in my medical decision making (see chart for details).     1.  Excuse note from urgent care: I had long discussion with the patient about his request.  I explained to him that I would need a medical reason to excuse him from work.  Working to hide is not a medical reason so I cannot honor that request.  Patient was not happy but I explained to him that there is nothing more I can do from a medical standpoint. Final Clinical Impressions(s) / UC Diagnoses   Final diagnoses:  Encounter to obtain excuse from work   Discharge Instructions   None    ED Prescriptions    None      Controlled Substance Prescriptions [place] Controlled Substance Registry consulted? No   Chase Picket, MD 04/06/19 1419

## 2019-03-31 NOTE — ED Triage Notes (Signed)
Difficult to understand patient, pt stating he needs a work note for 3 days to be out of work due to working hard.

## 2019-11-20 ENCOUNTER — Other Ambulatory Visit: Payer: Self-pay

## 2019-11-20 ENCOUNTER — Ambulatory Visit (HOSPITAL_COMMUNITY)
Admission: EM | Admit: 2019-11-20 | Discharge: 2019-11-20 | Disposition: A | Discharge status: Home / Self Care | Payer: PRIVATE HEALTH INSURANCE | Attending: Emergency Medicine | Admitting: Emergency Medicine

## 2019-11-20 ENCOUNTER — Encounter (HOSPITAL_COMMUNITY): Payer: Self-pay

## 2019-11-20 DIAGNOSIS — M549 Dorsalgia, unspecified: Secondary | ICD-10-CM

## 2019-11-20 DIAGNOSIS — M79605 Pain in left leg: Secondary | ICD-10-CM

## 2019-11-20 MED ORDER — CYCLOBENZAPRINE HCL 5 MG PO TABS: 5.0000 mg | ORAL_TABLET | Freq: Every day | ORAL | 0 refills | Status: DC

## 2019-11-20 MED ORDER — NAPROXEN 500 MG PO TABS: 500.0000 mg | ORAL_TABLET | Freq: Two times a day (BID) | ORAL | 0 refills | Status: DC

## 2019-11-20 NOTE — ED Triage Notes (Signed)
Pt has multiple c/o pain: left leg behind knee, right hand, right back/scapula pain. Pt speaking very rapidly, difficult to determine precise complaint/history. Pt is stating he has a "blood clot", but denies any medical diagnosis of same.   Pt c/o "hard knot" to right sub-axillar area/upper right rib area.

## 2019-11-20 NOTE — ED Provider Notes (Signed)
[facility]    CSN: [id number] Arrival date & time: 11/20/19  1001      History   Chief Complaint Chief Complaint  Patient presents with  . Leg Pain  . Back Pain    HPI Patrick Tran is a 49 y.o. male.   Patrick Tran presents with multiple complaints of pain which seemingly started a few days ago. Primarily upper back pain, low back pain and left leg pain. Back pain seems to trigger the left leg pain. No numbness tingling or weakness. No saddle symptoms. No known injury. 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 No specific calf pain, some left posterior knee pain without redness or swelling. No chest pain , no shortness of breath . Patient at first states has a history of blood clots, but then describes this more as feels like he doesn't get good circulation to lower leg, without known history of blood thinning medications. States he has been seen for this in the past with [facility], and per chart review it appears has been seen with back pain and thoracic back pain in the past, no documented previous blood clots. He doesn't smoke. He has tried ibuprofen which hasn't helped with his pain.   Audio interpreter used for history and physical.    ROS per HPI, negative if not otherwise mentioned.      History reviewed. No pertinent past medical history.  Patient Active Problem List   Diagnosis Date Noted  . DENTAL CARIES 11/25/2007    History reviewed. No pertinent surgical history.     Home Medications    Prior to Admission medications   Medication Sig Start Date End Date Taking? Authorizing Provider  cyclobenzaprine (FLEXERIL) 5 MG tablet Take 1 tablet (5 mg total) by mouth at bedtime. 11/20/19   Zigmund Gottron, NP  naproxen (NAPROSYN) 500 MG tablet Take 1 tablet (500 mg total) by mouth 2 (two) times daily. 11/20/19   Zigmund Gottron, NP    Family  History Family History  Family history unknown: Yes    Social History Social History   Tobacco Use  . Smoking status: Never Smoker  Substance Use Topics  . Alcohol use: Yes    Comment: social   . Drug use: No     Allergies   Patient has no known allergies.   Review of Systems Review of Systems   Physical Exam Triage Vital Signs ED Triage Vitals  Enc Vitals Group     BP 11/20/19 1038 (!) 166/106     Pulse Rate 11/20/19 1038 70     Resp 11/20/19 1038 20     Temp 11/20/19 1038 97.8 F (36.6 C)     Temp src --      SpO2 11/20/19 1038 99 %     Weight --      Height --      Head Circumference --      Peak Flow --      Pain Score 11/20/19 1037 6     Pain Loc --      Pain Edu? --      Excl. in [place]? --    No data found.  Updated Vital Signs BP (!) 166/106 (BP Location: Left Arm)   Pulse 70   Temp 97.8 F (36.6 C)   Resp 20   SpO2 99%   Visual Acuity Right Eye Distance:   Left  Eye Distance:   Bilateral Distance:    Right Eye Near:   Left Eye Near:    Bilateral Near:     Physical Exam Constitutional:      Appearance: He is well-developed.  Cardiovascular:     Rate and Rhythm: Normal rate.  Pulmonary:     Effort: Pulmonary effort is normal.  Musculoskeletal:     Thoracic back: Tenderness present. No bony tenderness. Normal range of motion.     Lumbar back: Tenderness present. No bony tenderness. Normal range of motion. Negative right straight leg raise test and negative left straight leg raise test.     Comments: Tenderness to lateral chest wall musculature, lat dorsi, bilaterally as well as bilateral medial trapezius; full ROM of upper extremities; mild left low back musculature with tenderness; strength equal bilaterally; gross sensation intact to lower extremities and ambulatory without difficulty; left calf is soft non tender without bruising; negative homans sign   Skin:    General: Skin is warm and dry.  Neurological:     Mental Status: He is  alert and oriented to person, place, and time.      UC Treatments / Results  Labs (all labs ordered are listed, but only abnormal results are displayed) Labs Reviewed - No data to display  EKG   Radiology No results found.  Procedures Procedures (including critical care time)  Medications Ordered in UC Medications - No data to display  Initial Impression / Assessment and Plan / UC Course  I have reviewed the triage vital signs and the nursing notes.  Pertinent labs & imaging results that were available during my care of the patient were reviewed by me and considered in my medical decision making (see chart for details).     Muscular strain and spasm likely related to physical labor at work. Pain management discussed. Return precautions provided. Patient verbalized understanding and agreeable to plan.   Final Clinical Impressions(s) / UC Diagnoses   Final diagnoses:  Left leg pain  Upper back pain     Discharge Instructions     Muscle relaxer, flexeril, at night to help with muscular pain.  Naproxen twice a day to help with pain, take with food.  Please follow up with a primary care provider for recheck of your symptoms.  Please return for any worsening of symptoms.    ED Prescriptions    Medication Sig Dispense Auth. Provider   naproxen (NAPROSYN) 500 MG tablet Take 1 tablet (500 mg total) by mouth 2 (two) times daily. 30 tablet Augusto Gamble B, NP   cyclobenzaprine (FLEXERIL) 5 MG tablet Take 1 tablet (5 mg total) by mouth at bedtime. 15 tablet Zigmund Gottron, NP     PDMP not reviewed this encounter.   Zigmund Gottron, NP 11/20/19 1130

## 2019-11-20 NOTE — Discharge Instructions (Signed)
Muscle relaxer, flexeril, at night to help with muscular pain.  Naproxen twice a day to help with pain, take with food.  Please follow up with a primary care provider for recheck of your symptoms.  Please return for any worsening of symptoms.

## 2021-09-07 ENCOUNTER — Encounter (HOSPITAL_COMMUNITY): Payer: Self-pay

## 2021-09-07 ENCOUNTER — Inpatient Hospital Stay (HOSPITAL_COMMUNITY)
Admission: EM | Admit: 2021-09-07 | Discharge: 2021-09-10 | DRG: 432 | Disposition: A | Discharge status: Home / Self Care | Payer: BC Managed Care – PPO | Attending: Internal Medicine | Admitting: Internal Medicine

## 2021-09-07 ENCOUNTER — Emergency Department (HOSPITAL_COMMUNITY): Payer: BC Managed Care – PPO

## 2021-09-07 ENCOUNTER — Ambulatory Visit (HOSPITAL_COMMUNITY): Admission: EM | Admit: 2021-09-07 | Discharge: 2021-09-07 | Payer: BC Managed Care – PPO

## 2021-09-07 ENCOUNTER — Other Ambulatory Visit: Payer: Self-pay

## 2021-09-07 DIAGNOSIS — J189 Pneumonia, unspecified organism: Secondary | ICD-10-CM | POA: Diagnosis present

## 2021-09-07 DIAGNOSIS — C220 Liver cell carcinoma: Secondary | ICD-10-CM

## 2021-09-07 DIAGNOSIS — R109 Unspecified abdominal pain: Secondary | ICD-10-CM | POA: Diagnosis present

## 2021-09-07 DIAGNOSIS — R160 Hepatomegaly, not elsewhere classified: Secondary | ICD-10-CM | POA: Diagnosis present

## 2021-09-07 DIAGNOSIS — K828 Other specified diseases of gallbladder: Secondary | ICD-10-CM | POA: Diagnosis present

## 2021-09-07 DIAGNOSIS — R1084 Generalized abdominal pain: Secondary | ICD-10-CM | POA: Diagnosis not present

## 2021-09-07 DIAGNOSIS — K769 Liver disease, unspecified: Secondary | ICD-10-CM | POA: Diagnosis present

## 2021-09-07 DIAGNOSIS — K652 Spontaneous bacterial peritonitis: Secondary | ICD-10-CM | POA: Diagnosis present

## 2021-09-07 DIAGNOSIS — K766 Portal hypertension: Secondary | ICD-10-CM | POA: Diagnosis present

## 2021-09-07 DIAGNOSIS — I81 Portal vein thrombosis: Secondary | ICD-10-CM | POA: Diagnosis present

## 2021-09-07 DIAGNOSIS — B182 Chronic viral hepatitis C: Secondary | ICD-10-CM | POA: Diagnosis present

## 2021-09-07 DIAGNOSIS — Z20822 Contact with and (suspected) exposure to covid-19: Secondary | ICD-10-CM | POA: Diagnosis present

## 2021-09-07 DIAGNOSIS — R188 Other ascites: Secondary | ICD-10-CM | POA: Diagnosis present

## 2021-09-07 DIAGNOSIS — D61818 Other pancytopenia: Secondary | ICD-10-CM | POA: Diagnosis present

## 2021-09-07 DIAGNOSIS — R634 Abnormal weight loss: Secondary | ICD-10-CM | POA: Diagnosis present

## 2021-09-07 DIAGNOSIS — K7460 Unspecified cirrhosis of liver: Secondary | ICD-10-CM | POA: Diagnosis not present

## 2021-09-07 LAB — COMPREHENSIVE METABOLIC PANEL
ALT: 148 U/L — ABNORMAL HIGH (ref 0–44)
AST: 310 U/L — ABNORMAL HIGH (ref 15–41)
Albumin: 2.1 g/dL — ABNORMAL LOW (ref 3.5–5.0)
Alkaline Phosphatase: 314 U/L — ABNORMAL HIGH (ref 38–126)
Anion gap: 6 (ref 5–15)
BUN: 12 mg/dL (ref 6–20)
CO2: 25 mmol/L (ref 22–32)
Calcium: 8.3 mg/dL — ABNORMAL LOW (ref 8.9–10.3)
Chloride: 103 mmol/L (ref 98–111)
Creatinine, Ser: 0.63 mg/dL (ref 0.61–1.24)
GFR, Estimated: >60 mL/min (ref >60)
Glucose, Bld: 84 mg/dL (ref 70–99)
Potassium: 4.2 mmol/L (ref 3.5–5.1)
Sodium: 134 mmol/L — ABNORMAL LOW (ref 135–145)
Total Bilirubin: 4.6 mg/dL — ABNORMAL HIGH (ref 0.3–1.2)
Total Protein: 8.4 g/dL — ABNORMAL HIGH (ref 6.5–8.1)

## 2021-09-07 LAB — CBC WITH DIFFERENTIAL/PLATELET
Abs Immature Granulocytes: 0.03 10*3/uL (ref 0.00–0.07)
Basophils Absolute: 0 10*3/uL (ref 0.0–0.1)
Basophils Relative: 1 %
Eosinophils Absolute: 0.1 10*3/uL (ref 0.0–0.5)
Eosinophils Relative: 1 %
HCT: 38.8 % — ABNORMAL LOW (ref 39.0–52.0)
Hemoglobin: 14 g/dL (ref 13.0–17.0)
Immature Granulocytes: 1 %
Lymphocytes Relative: 39 %
Lymphs Abs: 2.2 10*3/uL (ref 0.7–4.0)
MCH: 33.7 pg (ref 26.0–34.0)
MCHC: 36.1 g/dL — ABNORMAL HIGH (ref 30.0–36.0)
MCV: 93.5 fL (ref 80.0–100.0)
Monocytes Absolute: 0.4 10*3/uL (ref 0.1–1.0)
Monocytes Relative: 7 %
Neutro Abs: 3 10*3/uL (ref 1.7–7.7)
Neutrophils Relative %: 51 %
Platelets: 146 10*3/uL — ABNORMAL LOW (ref 150–400)
RBC: 4.15 MIL/uL — ABNORMAL LOW (ref 4.22–5.81)
RDW: 18.4 % — ABNORMAL HIGH (ref 11.5–15.5)
WBC: 5.8 10*3/uL (ref 4.0–10.5)
nRBC: 0 % (ref 0.0–0.2)

## 2021-09-07 LAB — LIPASE, BLOOD: Lipase: 37 U/L (ref 11–51)

## 2021-09-07 LAB — URINALYSIS, ROUTINE W REFLEX MICROSCOPIC
Glucose, UA: NEGATIVE mg/dL
Hgb urine dipstick: NEGATIVE
Ketones, ur: 5 mg/dL — AB
Leukocytes,Ua: NEGATIVE
Nitrite: NEGATIVE
Protein, ur: NEGATIVE mg/dL
Specific Gravity, Urine: 1.029 (ref 1.005–1.030)
pH: 5 (ref 5.0–8.0)

## 2021-09-07 MED ORDER — IOHEXOL 350 MG/ML SOLN
80.0000 mL | Freq: Once | INTRAVENOUS | Status: AC | PRN
  Administered 2021-09-07: 80 mL via INTRAVENOUS

## 2021-09-07 NOTE — ED Provider Triage Note (Signed)
Emergency Medicine Provider Triage Evaluation Note  Patrick Tran , a 51 y.o. male  was evaluated in triage.  Pt complains of nominal bloating.  Apparently this has been getting worse over the past several months.  He is having significant abdominal pain.  That is why he came to the emergency department today.  He denies any fevers or chills.  No history of liver disease, kidney disease, or CHF. Review of Systems  Positive:  Negative:   Physical Exam  BP (!) 146/105 (BP Location: Left Arm)    Pulse 87    Temp 98 F (36.7 C) (Oral)    Resp 16    SpO2 98%  Gen:   Awake, no distress   Resp:  Normal effort  MSK:   Moves extremities without difficulty  Other:  Distended abdomen. + fluid wave. Generalized significant ttp Medical Decision Making  Medically screening exam initiated at 5:22 PM.  Appropriate orders placed.  Patrick Tran was informed that the remainder of the evaluation will be completed by another provider, this initial triage assessment does not replace that evaluation, and the importance of remaining in the ED until their evaluation is complete.  Likely ascites. Needs work up. CT a/p ordered bc painful   Adolphus Birchwood, PA-C 09/07/21 1723

## 2021-09-07 NOTE — ED Provider Notes (Signed)
[facility]    CSN: [id number] Arrival date & time: 09/07/21  1341      History   Chief Complaint Chief Complaint  Patient presents with   Abdominal Pain    HPI Dorrian Dibiasio is a 51 y.o. male.    Patient presents with severe generalized abdominal pain, worse in upper quadrants progressing over the last 2 months.  Endorses weight loss however stomach has increased in size.  Over the last 2 to 3 days severity of pain has increased, can be felt with movement and with cough and deep breathing.  Has intermittent bouts of nausea with decreased appetite.  Denies fever, chills, URI symptoms, diarrhea, vomiting.  Denies use of alcohol.  [place] translator used   History reviewed. No pertinent past medical history.  Patient Active Problem List   Diagnosis Date Noted   DENTAL CARIES 11/25/2007    History reviewed. No pertinent surgical history.     Home Medications    Prior to Admission medications   Medication Sig Start Date End Date Taking? Authorizing Provider  cyclobenzaprine (FLEXERIL) 5 MG tablet Take 1 tablet (5 mg total) by mouth at bedtime. 11/20/19   Zigmund Gottron, NP  naproxen (NAPROSYN) 500 MG tablet Take 1 tablet (500 mg total) by mouth 2 (two) times daily. 11/20/19   Zigmund Gottron, NP    Family History Family History  Family history unknown: Yes    Social History Social History   Tobacco Use   Smoking status: Never  Substance Use Topics   Alcohol use: Yes    Comment: social    Drug use: No     Allergies   Patient has no known allergies.   Review of Systems Review of Systems  Constitutional: Negative.   Respiratory: Negative.    Cardiovascular: Negative.   Skin: Negative.     Physical Exam Triage Vital Signs ED Triage Vitals  Enc Vitals Group     BP 09/07/21 1458 (!) 163/94     Pulse Rate 09/07/21 1458 90     Resp 09/07/21 1458 16     Temp 09/07/21 1458 98.3 F (36.8 C)     Temp src --      SpO2 09/07/21  1458 100 %     Weight --      Height --      Head Circumference --      Peak Flow --      Pain Score 09/07/21 1459 7     Pain Loc --      Pain Edu? --      Excl. in [place]? --    No data found.  Updated Vital Signs BP (!) 163/94 (BP Location: Left Arm)    Pulse 90    Temp 98.3 F (36.8 C)    Resp 16    SpO2 100%   Visual Acuity Right Eye Distance:   Left Eye Distance:   Bilateral Distance:    Right Eye Near:   Left Eye Near:    Bilateral Near:     Physical Exam   UC Treatments / Results  Labs (all labs ordered are listed, but only abnormal results are displayed) Labs Reviewed - No data to display  EKG   Radiology No results found.  Procedures Procedures (including critical care time)  Medications Ordered in UC Medications - No data to display  Initial Impression / Assessment and Plan / UC Course  I have reviewed the triage vital signs and the nursing  notes.  Pertinent labs & imaging results that were available during my care of the patient were reviewed by me and considered in my medical decision making (see chart for details).  Generalized abdominal pain  Patient sent to the nearest emergency department due to severity of abdominal pain, extent of abdominal distention and need for imaging, low suspicion for infectious cause as symptoms have progressively worsened over a 58-month period, concern for ascites or possible abdominal tumor, patient requires more work-up then capable in urgent care setting, patient in agreement plan, vital signs are stable therefore patient will escort self Final Clinical Impressions(s) / UC Diagnoses   Final diagnoses:  None   Discharge Instructions   None    ED Prescriptions   None    PDMP not reviewed this encounter.   Hans Eden, [place] 09/07/21 1617

## 2021-09-07 NOTE — ED Triage Notes (Signed)
Pt arrived POV c/o abdominal distention. Pt states he feels bloated and has had pain in his stomach for months now. Pt states he can't really eat or sleep.

## 2021-09-07 NOTE — ED Notes (Signed)
Patient is being discharged from the Urgent Care and sent to the Emergency Department via personal vehicle . Per Provider Lowella Petties, patient is in need of higher level of care due to abdominal pain. Patient is aware and verbalizes understanding of plan of care.   Vitals:   09/07/21 1458  BP: (!) 163/94  Pulse: 90  Resp: 16  Temp: 98.3 F (36.8 C)  SpO2: 100%

## 2021-09-07 NOTE — ED Triage Notes (Signed)
Pt presents for abdominal pain x 1 month. No known pain. Last bowel movement was yesterday. Pt reports pain when he cough.

## 2021-09-08 ENCOUNTER — Observation Stay (HOSPITAL_COMMUNITY): Payer: BC Managed Care – PPO

## 2021-09-08 ENCOUNTER — Encounter (HOSPITAL_COMMUNITY): Payer: Self-pay | Admitting: Internal Medicine

## 2021-09-08 ENCOUNTER — Emergency Department (HOSPITAL_COMMUNITY): Payer: BC Managed Care – PPO

## 2021-09-08 DIAGNOSIS — K652 Spontaneous bacterial peritonitis: Secondary | ICD-10-CM | POA: Diagnosis present

## 2021-09-08 DIAGNOSIS — R188 Other ascites: Secondary | ICD-10-CM | POA: Diagnosis present

## 2021-09-08 DIAGNOSIS — R1010 Upper abdominal pain, unspecified: Secondary | ICD-10-CM

## 2021-09-08 DIAGNOSIS — K7460 Unspecified cirrhosis of liver: Principal | ICD-10-CM

## 2021-09-08 DIAGNOSIS — K828 Other specified diseases of gallbladder: Secondary | ICD-10-CM

## 2021-09-08 DIAGNOSIS — B182 Chronic viral hepatitis C: Secondary | ICD-10-CM | POA: Diagnosis present

## 2021-09-08 DIAGNOSIS — R109 Unspecified abdominal pain: Secondary | ICD-10-CM | POA: Diagnosis not present

## 2021-09-08 DIAGNOSIS — D61818 Other pancytopenia: Secondary | ICD-10-CM | POA: Diagnosis present

## 2021-09-08 DIAGNOSIS — J189 Pneumonia, unspecified organism: Secondary | ICD-10-CM | POA: Diagnosis present

## 2021-09-08 DIAGNOSIS — K769 Liver disease, unspecified: Secondary | ICD-10-CM | POA: Diagnosis not present

## 2021-09-08 HISTORY — PX: IR PARACENTESIS: IMG2679

## 2021-09-08 LAB — HEPATITIS PANEL, ACUTE
HCV Ab: REACTIVE — AB
Hep A IgM: NONREACTIVE
Hep B C IgM: NONREACTIVE
Hepatitis B Surface Ag: NONREACTIVE

## 2021-09-08 LAB — BODY FLUID CELL COUNT WITH DIFFERENTIAL
Eos, Fluid: 0 %
Lymphs, Fluid: 74 %
Monocyte-Macrophage-Serous Fluid: 3 % — ABNORMAL LOW (ref 50–90)
Neutrophil Count, Fluid: 23 % (ref 0–25)
Total Nucleated Cell Count, Fluid: 108 cu mm (ref 0–1000)

## 2021-09-08 LAB — RESP PANEL BY RT-PCR (FLU A&B, COVID) ARPGX2
Influenza A by PCR: NEGATIVE
Influenza B by PCR: NEGATIVE
SARS Coronavirus 2 by RT PCR: NEGATIVE

## 2021-09-08 LAB — CBC
HCT: 33.8 % — ABNORMAL LOW (ref 39.0–52.0)
Hemoglobin: 11.9 g/dL — ABNORMAL LOW (ref 13.0–17.0)
MCH: 33.2 pg (ref 26.0–34.0)
MCHC: 35.2 g/dL (ref 30.0–36.0)
MCV: 94.4 fL (ref 80.0–100.0)
Platelets: 135 10*3/uL — ABNORMAL LOW (ref 150–400)
RBC: 3.58 MIL/uL — ABNORMAL LOW (ref 4.22–5.81)
RDW: 18.2 % — ABNORMAL HIGH (ref 11.5–15.5)
WBC: 4.9 10*3/uL (ref 4.0–10.5)
nRBC: 0 % (ref 0.0–0.2)

## 2021-09-08 LAB — COMPREHENSIVE METABOLIC PANEL
ALT: 119 U/L — ABNORMAL HIGH (ref 0–44)
AST: 248 U/L — ABNORMAL HIGH (ref 15–41)
Albumin: 1.7 g/dL — ABNORMAL LOW (ref 3.5–5.0)
Alkaline Phosphatase: 228 U/L — ABNORMAL HIGH (ref 38–126)
Anion gap: 6 (ref 5–15)
BUN: 12 mg/dL (ref 6–20)
CO2: 24 mmol/L (ref 22–32)
Calcium: 7.8 mg/dL — ABNORMAL LOW (ref 8.9–10.3)
Chloride: 105 mmol/L (ref 98–111)
Creatinine, Ser: 0.7 mg/dL (ref 0.61–1.24)
GFR, Estimated: >60 mL/min (ref >60)
Glucose, Bld: 112 mg/dL — ABNORMAL HIGH (ref 70–99)
Potassium: 4.2 mmol/L (ref 3.5–5.1)
Sodium: 135 mmol/L (ref 135–145)
Total Bilirubin: 4.1 mg/dL — ABNORMAL HIGH (ref 0.3–1.2)
Total Protein: 6.8 g/dL (ref 6.5–8.1)

## 2021-09-08 LAB — BILIRUBIN, DIRECT: Bilirubin, Direct: 2.4 mg/dL — ABNORMAL HIGH (ref 0.0–0.2)

## 2021-09-08 LAB — ALBUMIN, PLEURAL OR PERITONEAL FLUID: Albumin, Fluid: <1.5 g/dL

## 2021-09-08 LAB — RAPID URINE DRUG SCREEN, HOSP PERFORMED
Amphetamines: NOT DETECTED
Barbiturates: NOT DETECTED
Benzodiazepines: NOT DETECTED
Cocaine: NOT DETECTED
Opiates: NOT DETECTED
Tetrahydrocannabinol: NOT DETECTED

## 2021-09-08 LAB — GRAM STAIN

## 2021-09-08 LAB — HIV ANTIBODY (ROUTINE TESTING W REFLEX): HIV Screen 4th Generation wRfx: NONREACTIVE

## 2021-09-08 LAB — PROTIME-INR
INR: 1.4 — ABNORMAL HIGH (ref 0.8–1.2)
Prothrombin Time: 17 seconds — ABNORMAL HIGH (ref 11.4–15.2)

## 2021-09-08 LAB — LACTATE DEHYDROGENASE, PLEURAL OR PERITONEAL FLUID: LD, Fluid: 47 U/L — ABNORMAL HIGH (ref 3–23)

## 2021-09-08 LAB — POC OCCULT BLOOD, ED: Fecal Occult Bld: NEGATIVE

## 2021-09-08 LAB — PROTEIN, PLEURAL OR PERITONEAL FLUID: Total protein, fluid: <3 g/dL

## 2021-09-08 LAB — LACTATE DEHYDROGENASE: LDH: 251 U/L — ABNORMAL HIGH (ref 98–192)

## 2021-09-08 LAB — MAGNESIUM: Magnesium: 2 mg/dL (ref 1.7–2.4)

## 2021-09-08 LAB — AMYLASE, PLEURAL OR PERITONEAL FLUID: Amylase, Fluid: 34 U/L

## 2021-09-08 LAB — GLUCOSE, PLEURAL OR PERITONEAL FLUID: Glucose, Fluid: 72 mg/dL

## 2021-09-08 MED ORDER — SODIUM CHLORIDE 0.9 % IV SOLN
2.0000 g | INTRAVENOUS | Status: DC
  Administered 2021-09-08 – 2021-09-09 (×2): 2 g via INTRAVENOUS
  Filled 2021-09-08 (×3): qty 20

## 2021-09-08 MED ORDER — GADOBUTROL 1 MMOL/ML IV SOLN
6.2000 mL | Freq: Once | INTRAVENOUS | Status: AC | PRN
  Administered 2021-09-08: 6.2 mL via INTRAVENOUS

## 2021-09-08 MED ORDER — ACETAMINOPHEN 325 MG PO TABS: 650.0000 mg | ORAL_TABLET | Freq: Four times a day (QID) | ORAL | Status: DC | PRN

## 2021-09-08 MED ORDER — LIDOCAINE HCL 1 % IJ SOLN
INTRAMUSCULAR | Status: AC
  Administered 2021-09-08: 10 mL
  Filled 2021-09-08: qty 20

## 2021-09-08 MED ORDER — SODIUM CHLORIDE 0.9 % IV SOLN
500.0000 mg | INTRAVENOUS | Status: DC
  Administered 2021-09-08: 500 mg via INTRAVENOUS
  Filled 2021-09-08: qty 5

## 2021-09-08 MED ORDER — FENTANYL CITRATE PF 50 MCG/ML IJ SOSY
50.0000 ug | PREFILLED_SYRINGE | Freq: Once | INTRAMUSCULAR | Status: AC
  Administered 2021-09-08: 50 ug via INTRAVENOUS
  Filled 2021-09-08: qty 1

## 2021-09-08 MED ORDER — NALOXONE HCL 0.4 MG/ML IJ SOLN: 0.4000 mg | INTRAMUSCULAR | Status: DC | PRN

## 2021-09-08 MED ORDER — ACETAMINOPHEN 650 MG RE SUPP: 650.0000 mg | Freq: Four times a day (QID) | RECTAL | Status: DC | PRN

## 2021-09-08 MED ORDER — FENTANYL CITRATE PF 50 MCG/ML IJ SOSY
25.0000 ug | PREFILLED_SYRINGE | INTRAMUSCULAR | Status: DC | PRN
Start: 2021-09-08 — End: 2021-09-10
  Administered 2021-09-08 – 2021-09-09 (×2): 25 ug via INTRAVENOUS
  Filled 2021-09-08 (×2): qty 1

## 2021-09-08 NOTE — ED Provider Notes (Signed)
[facility] EMERGENCY DEPARTMENT Provider Note   CSN: [id number] Arrival date & time: 09/07/21  1550     History  Chief Complaint  Patient presents with   Bloated    Patrick Tran is a 51 y.o. male.  The history is provided by the patient. A language interpreter was used [phone] [name] = Kinyarwanda).  Abdominal Pain Pain location:  Generalized Pain quality: aching   Pain radiates to:  Does not radiate Pain severity:  Moderate Onset quality:  Gradual Duration: 1 month. Timing:  Constant Progression:  Worsening Chronicity:  New Relieved by:  Nothing Worsened by:  Movement and palpation Associated symptoms: chest pain, constipation and nausea   Associated symptoms: no diarrhea, no dysuria, no fever and no vomiting      Pt denies medical conditions He does not take daily meds No recent travel No ETOH abuse Home Medications Prior to Admission medications   Not on File      Allergies    Patient has no known allergies.    Review of Systems   Review of Systems  Constitutional:  Positive for unexpected weight change. Negative for fever.  Cardiovascular:  Positive for chest pain and leg swelling.  Gastrointestinal:  Positive for abdominal pain, blood in stool, constipation and nausea. Negative for diarrhea and vomiting.  Genitourinary:  Negative for dysuria.  Psychiatric/Behavioral:  Positive for sleep disturbance.   All other systems reviewed and are negative.  Physical Exam Updated Vital Signs BP 115/79    Pulse 71    Temp 98.1 F (36.7 C)    Resp 17    SpO2 99%  Physical Exam CONSTITUTIONAL: Ill-appearing HEAD: Normocephalic/atraumatic EYES: EOMI/PERRL, scleral icterus noted ENMT: Mucous membranes moist NECK: supple no meningeal signs SPINE/BACK:entire spine nontender CV: S1/S2 noted, no murmurs/rubs/gallops noted LUNGS: Lungs are clear to auscultation bilaterally, no apparent distress ABDOMEN: soft, diffuse tenderness,  distention noted Rectal -no blood or melena noted.  No rectal mass or obvious hemorrhoids.  Nurse present for exam GU:no cva tenderness NEURO: Pt is awake/alert/appropriate, moves all extremitiesx4.  No facial droop.   EXTREMITIES: pulses normal/equal, full ROM, lower extremity edema is noted SKIN: warm, color normal PSYCH: no abnormalities of mood noted, alert and oriented to situation  ED Results / Procedures / Treatments   Labs (all labs ordered are listed, but only abnormal results are displayed) Labs Reviewed  CBC WITH DIFFERENTIAL/PLATELET - Abnormal; Notable for the following components:      Result Value   RBC 4.15 (*)    HCT 38.8 (*)    MCHC 36.1 (*)    RDW 18.4 (*)    Platelets 146 (*)    All other components within normal limits  COMPREHENSIVE METABOLIC PANEL - Abnormal; Notable for the following components:   Sodium 134 (*)    Calcium 8.3 (*)    Total Protein 8.4 (*)    Albumin 2.1 (*)    AST 310 (*)    ALT 148 (*)    Alkaline Phosphatase 314 (*)    Total Bilirubin 4.6 (*)    All other components within normal limits  URINALYSIS, ROUTINE W REFLEX MICROSCOPIC - Abnormal; Notable for the following components:   Color, Urine AMBER (*)    Bilirubin Urine SMALL (*)    Ketones, ur 5 (*)    All other components within normal limits  RESP PANEL BY RT-PCR (FLU A&B, COVID) ARPGX2  LIPASE, BLOOD  HEPATITIS PANEL, ACUTE  POC OCCULT BLOOD, ED  EKG EKG Interpretation  Date/Time:  Friday September 08 2021 02:36:52 EST Ventricular Rate:  72 PR Interval:  130 QRS Duration: 79 QT Interval:  404 QTC Calculation: 443 R Axis:   94 Text Interpretation: Sinus rhythm Borderline right axis deviation Consider left ventricular hypertrophy Confirmed by Ripley Fraise [phone]) on 09/08/2021 2:46:15 AM  Radiology CT Abdomen Pelvis W Contrast  Result Date: 09/07/2021 CLINICAL DATA:  Bloating with abdominal pain EXAM: CT ABDOMEN AND PELVIS WITH CONTRAST TECHNIQUE: Multidetector CT  imaging of the abdomen and pelvis was performed using the standard protocol following bolus administration of intravenous contrast. CONTRAST:  24mL OMNIPAQUE IOHEXOL 350 MG/ML SOLN COMPARISON:  CT 08/19/2009 report FINDINGS: Lower chest: Lung bases demonstrate ground-glass density in the right lower lobe. No consolidation or effusion. Borderline cardiomegaly. Hepatobiliary: Nodular hepatic contour consistent with liver cirrhosis. Multiple heterogeneous liver masses with vague dominant mass in the right hepatic lobe measuring 14.5 by 8.6 cm. Distended gallbladder with probable gallbladder wall thickening. No calcified stone. No biliary dilatation. Pancreas: Unremarkable. No pancreatic ductal dilatation or surrounding inflammatory changes. Spleen: Borderline enlarged at 13.2 cm AP. Adrenals/Urinary Tract: Adrenal glands are within normal limits. Kidneys show no hydronephrosis. Central renal hyperdensity may reflect early excretion of contrast. The bladder is unremarkable Stomach/Bowel: The stomach is nonenlarged. No dilated small bowel. No acute bowel wall thickening. Negative appendix. Left colon diverticular disease. Vascular/Lymphatic: Mild aortic atherosclerosis. No aneurysm. Thrombus within the right and main portal veins. Splenic vein appears grossly patent. Recanalized paraumbilical vein. Small perigastric varices. Reproductive: Prostate is unremarkable. Other: No free air.  Large volume of ascites. Musculoskeletal: No acute osseous abnormality. IMPRESSION: 1. Liver cirrhosis. Multiple hepatic masses including large at least 14.5 cm dominant right hepatic lobe mass concerning for hepatocellular carcinoma and or metastatic disease. There is portal vein thrombosis. 2. Borderline to mild splenomegaly. Suspicion of upper abdominal varices. Large volume of ascites in the abdomen and pelvis. 3. Slightly dilated gallbladder with gallbladder wall thickening or edema, nonspecific and could be due to liver disease.  Correlation with ultrasound if clinically appropriate. 4. Mild ground-glass density in the right lower lobe, possible pneumonia Electronically Signed   By: Donavan Foil M.D.   On: 09/07/2021 21:06   DG Chest Port 1 View  Result Date: 09/08/2021 CLINICAL DATA:  Chest pain.  Chest EXAM: PORTABLE CHEST 1 VIEW COMPARISON:  Radiograph dated 08/19/2009. FINDINGS: Shallow inspiration. No focal consolidation, pleural effusion, or pneumothorax. The cardiac silhouette is within normal limits. No acute osseous pathology. IMPRESSION: No active disease. Electronically Signed   By: Anner Crete M.D.   On: 09/08/2021 02:37    Procedures Procedures     Medications Ordered in ED Medications  iohexol (OMNIPAQUE) 350 MG/ML injection 80 mL (80 mLs Intravenous Contrast Given 09/07/21 2052)  fentaNYL (SUBLIMAZE) injection 50 mcg (50 mcg Intravenous Given 09/08/21 0254)    ED Course/ Medical Decision Making/ A&P                           Medical Decision Making  This patient presents to the ED for concern of abdominal distention and pain, this involves an extensive number of treatment options, and is a complaint that carries with it a high risk of complications and morbidity.  The differential diagnosis includes bowel obstruction, bowel perforation, appendicitis, pancreatitis, ascites, SBP  Social Determinants of Health: Patients impaired access to primary care and English as a second language   increases the complexity of managing their  presentation  Additional history obtained: Additional history obtained from  interpreter Records reviewed previous admission documents  Lab Tests: I Ordered, and personally interpreted labs.  The pertinent results include: Liver dysfunction  Imaging Studies ordered: I ordered imaging studies including CT scan abdomen/   I independently visualized and interpreted imaging which showed ascites and likely hepatocellular carcinoma I agree with the radiologist  interpretation  Cardiac Monitoring: The patient was maintained on a cardiac monitor.  I personally viewed and interpreted the cardiac monitor which showed an underlying rhythm of:  sinus rhythm  Medicines ordered and prescription drug management: I ordered medication including IV fentanyl for pain Reevaluation of the patient after these medicines showed that the patient    stayed the same   Consultations Obtained: I requested consultation with the admitting physician Dr. Vernard Gambles , and discussed  findings as well as pertinent plan - they recommend: admission  Reevaluation: After the interventions noted above, I reevaluated the patient and found that they have :stayed the same  Complexity of problems addressed: Patients presentation is most consistent with  acute, uncomplicated illness  Disposition: After consideration of the diagnostic results and the patients response to treatment,  I feel that the patent would benefit from admission    Patient with abdominal pain and distention for over a month.  Patient likely has metastatic disease vs. [place] with cirrhosis and ascites.  Low suspicion for acute SBP.  Patient would benefit from admission to have large-volume paracentesis and further evaluation. Patient has very little access to care outside the hospital        Final Clinical Impression(s) / ED Diagnoses Final diagnoses:  Cirrhosis of liver with ascites, unspecified hepatic cirrhosis type [facility])    Rx / DC Orders ED Discharge Orders     None         Ripley Fraise, MD 09/08/21 [id number]

## 2021-09-08 NOTE — Procedures (Signed)
PROCEDURE SUMMARY:  Successful US guided paracentesis from right lateral abdomen.  Yielded 1.3 liters of blood-tinged fluid.  No immediate complications.  Pt tolerated well.   Specimen was sent for labs.  EBL < 51mL  Docia Barrier PA-C 09/08/2021 4:29 PM

## 2021-09-08 NOTE — Assessment & Plan Note (Addendum)
-   Presented with acute abdominal pain, distention, loss of weight in the last month.  Has remote history of alcohol use in social settings, no prior history of cirrhosis.  Denies recreational drug use. -RUQ US showed gallbladder wall thickening without cholelithiasis, hepatocellular disease with multiple hepatic masses with the largest measuring 4.8 x 4.2 cm, echogenic material in portal vein concerning for nonocclusive thrombus.  -Has large ascites, splenomegaly, coagulopathy, pancytopenia - status post 1.3 L paracentesis, no SBP -GI following, started on Lasix 40 mg daily, Aldactone 100 mg daily, outpatient follow-up with GI.

## 2021-09-08 NOTE — Assessment & Plan Note (Addendum)
-   New diagnosis, CT abdomen confirms liver cirrhosis with multiple hepatic masses including large at least 14.5 cm dominant right hepatic lobe mass concerning for [facility] and/or metastatic disease, portal vein thrombosis -Status post paracentesis 1.3 L removed, cytology pending, studies showed no SBP.  Cytology pending. -Started on Lasix 40 mg daily, Aldactone 100 mg daily, fluid restriction

## 2021-09-08 NOTE — ED Notes (Signed)
Pt in MRI.

## 2021-09-08 NOTE — Assessment & Plan Note (Addendum)
-   MRI + right lower lobe pneumonia  -DC of IV Zithromax and Rocephin, transition to oral Augmentin to complete course.

## 2021-09-08 NOTE — ED Notes (Signed)
Patient transported to CT 

## 2021-09-08 NOTE — H&P (Addendum)
History and Physical    PLEASE NOTE THAT DRAGON DICTATION SOFTWARE WAS USED IN THE CONSTRUCTION OF THIS NOTE.   Ahamed Hofland QMV:[id number] DOB: Mar 31, 1971 DOA: 09/07/2021  PCP: Patient, No Pcp Per (Inactive) (does not currently have a PCP; will need to be addressed during this hospitalization) Patient coming from: home   I have personally briefly reviewed patient's old medical records in [place]  Chief Complaint: Abdominal pain  HPI: Patrick Tran is a 51 y.o. male who is admitted to [facility] on 09/07/2021 with new diagnosis of cirrhosis after presenting from home to [facility] ED complaining of abdominal pain.   Of note, patient's primary language is [place] (from [place]).   The patient reports progressive abdominal pain associated with worsening abdominal distention over the course of the last month.  Describes the pain as sharp in nature, with diffuse distribution, which has become constant over the last week.  Reports exacerbation with palpation over the abdomen.  Has not noticed any prandial exacerbation associated with this.  Not associate with any nausea, vomiting, diarrhea, melena, hematochezia.  He also denies any associated subjective fever, chills, rigors, generalized myalgias.  Denies any associated shortness of breath, palpitations, diaphoresis.  No recent trauma or travel.  He also denies any recent dysuria, gross hematuria, or change in urinary urgency/frequency.  No rash.  However, he does report unintentional weight loss last month, although the specific quantity of this weight loss is currently unclear to me.  Denies any known history of underlying cirrhosis.  He reports consumption of alcohol is limited to social settings, and denies any routine/regular alcohol consumption.  Denies any use of recreational drugs or routine use of acetaminophen.  Per chart review, it appears that most recent prior liver enzymes occurred in 2014, at which time mild  transaminitis was noted in the absence of elevation of T bili or alkaline phosphatase.    ED Course:  Vital signs in the ED were notable for the following:  -Afebrile; heart rate 60-77; blood pressure 115/79  - 143/99; respiratory rate 16-20, oxygen saturation 97 to 100% on room air.  Labs were notable for the following: CMP notable for the following: Albumin 2.1, alkaline phosphatase 314, AST 310, ALT 148, total bilirubin 4.6.  Urinalysis notable for no white blood cells.  COVID-19/influenza PCR negative.  Imaging and additional notable ED work-up: EKG shows sinus rhythm with heart rate 72, normal intervals, nonspecific T wave inversion in lead III, no evidence of ST changes, including no evidence of ST elevation.  Chest x-ray shows no acute cardiopulmonary process.  CT abdomen/pelvis with contrast shows cirrhosis with multiple hepatic masses, largest noted to be at least 14.5 cm in the right hepatic lobe concerning for hepatocellular carcinoma versus metastatic disease; CT abdomen/pelvis also shows evidence of large volume ascites, slightly dilated gallbladder with evidence of gallbladder wall thickening but without common bile duct dilation or overt choledocholithiasis.  While in the ED, the following were administered: Fentanyl 50 mcg IV x1.  Subsequently, the patient is being admitted for observation for further evaluation management of new diagnosis of cirrhosis with ascites as well as further evaluation of gallbladder wall thickening.     Review of Systems: As per HPI otherwise 10 point review of systems negative.   History reviewed. No pertinent past medical history.  History reviewed. No pertinent surgical history.  Social History:  reports that he has never smoked. He does not have any smokeless tobacco history on file. He reports current alcohol use.  He reports that he does not use drugs.   No Known Allergies  Family History  Family history unknown: Yes    Family history  reviewed and not pertinent   Home medications: Patient denies use of any scheduled or prn prescription or over-the-counter medications as an outpatient.  Objective    Physical Exam: Vitals:   09/08/21 0145 09/08/21 0245 09/08/21 0300 09/08/21 0330  BP: (!) 146/92 138/80 117/69 115/79  Pulse: 85 78 79 71  Resp: 16 (!) 22 18 17   Temp:      TempSrc:      SpO2: 100% 100% 97% 99%    General: appears to be stated age; alert, oriented Skin: warm, dry, no rash Head:  AT/[place] Mouth:  Oral mucosa membranes appear moist, normal dentition Neck: supple; trachea midline Heart:  RRR; did not appreciate any M/R/G Lungs: CTAB, did not appreciate any wheezes, rales, or rhonchi Abdomen: + BS; soft, mildly distended; mild tenderness to palpation in a diffuse pattern;  Vascular: 2+ pedal pulses b/l; 2+ radial pulses b/l Extremities: no peripheral edema, no muscle wasting Neuro: strength and sensation intact in upper and lower extremities b/l   Labs on Admission: I have personally reviewed following labs and imaging studies  CBC: Recent Labs  Lab 09/07/21 1730  WBC 5.8  NEUTROABS 3.0  HGB 14.0  HCT 38.8*  MCV 93.5  PLT 683*   Basic Metabolic Panel: Recent Labs  Lab 09/07/21 1730  NA 134*  K 4.2  CL 103  CO2 25  GLUCOSE 84  BUN 12  CREATININE 0.63  CALCIUM 8.3*   GFR: CrCl cannot be calculated (Unknown ideal weight.). Liver Function Tests: Recent Labs  Lab 09/07/21 1730  AST 310*  ALT 148*  ALKPHOS 314*  BILITOT 4.6*  PROT 8.4*  ALBUMIN 2.1*   Recent Labs  Lab 09/07/21 1730  LIPASE 37   No results for input(s): AMMONIA in the last 168 hours. Coagulation Profile: No results for input(s): INR, PROTIME in the last 168 hours. Cardiac Enzymes: No results for input(s): CKTOTAL, CKMB, CKMBINDEX, TROPONINI in the last 168 hours. BNP (last 3 results) No results for input(s): PROBNP in the last 8760 hours. HbA1C: No results for input(s): HGBA1C in the last 72  hours. CBG: No results for input(s): GLUCAP in the last 168 hours. Lipid Profile: No results for input(s): CHOL, HDL, LDLCALC, TRIG, CHOLHDL, LDLDIRECT in the last 72 hours. Thyroid Function Tests: No results for input(s): TSH, T4TOTAL, FREET4, T3FREE, THYROIDAB in the last 72 hours. Anemia Panel: No results for input(s): VITAMINB12, FOLATE, FERRITIN, TIBC, IRON, RETICCTPCT in the last 72 hours. Urine analysis:    Component Value Date/Time   COLORURINE AMBER (A) 09/07/2021 1719   APPEARANCEUR CLEAR 09/07/2021 1719   LABSPEC 1.029 09/07/2021 1719   PHURINE 5.0 09/07/2021 1719   GLUCOSEU NEGATIVE 09/07/2021 1719   HGBUR NEGATIVE 09/07/2021 1719   BILIRUBINUR SMALL (A) 09/07/2021 1719   KETONESUR 5 (A) 09/07/2021 1719   PROTEINUR NEGATIVE 09/07/2021 1719   UROBILINOGEN 1.0 12/24/2013 1344   NITRITE NEGATIVE 09/07/2021 1719   LEUKOCYTESUR NEGATIVE 09/07/2021 1719    Radiological Exams on Admission: CT Abdomen Pelvis W Contrast  Result Date: 09/07/2021 CLINICAL DATA:  Bloating with abdominal pain EXAM: CT ABDOMEN AND PELVIS WITH CONTRAST TECHNIQUE: Multidetector CT imaging of the abdomen and pelvis was performed using the standard protocol following bolus administration of intravenous contrast. CONTRAST:  55mL OMNIPAQUE IOHEXOL 350 MG/ML SOLN COMPARISON:  CT 08/19/2009 report FINDINGS: Lower chest: Lung  bases demonstrate ground-glass density in the right lower lobe. No consolidation or effusion. Borderline cardiomegaly. Hepatobiliary: Nodular hepatic contour consistent with liver cirrhosis. Multiple heterogeneous liver masses with vague dominant mass in the right hepatic lobe measuring 14.5 by 8.6 cm. Distended gallbladder with probable gallbladder wall thickening. No calcified stone. No biliary dilatation. Pancreas: Unremarkable. No pancreatic ductal dilatation or surrounding inflammatory changes. Spleen: Borderline enlarged at 13.2 cm AP. Adrenals/Urinary Tract: Adrenal glands are within  normal limits. Kidneys show no hydronephrosis. Central renal hyperdensity may reflect early excretion of contrast. The bladder is unremarkable Stomach/Bowel: The stomach is nonenlarged. No dilated small bowel. No acute bowel wall thickening. Negative appendix. Left colon diverticular disease. Vascular/Lymphatic: Mild aortic atherosclerosis. No aneurysm. Thrombus within the right and main portal veins. Splenic vein appears grossly patent. Recanalized paraumbilical vein. Small perigastric varices. Reproductive: Prostate is unremarkable. Other: No free air.  Large volume of ascites. Musculoskeletal: No acute osseous abnormality. IMPRESSION: 1. Liver cirrhosis. Multiple hepatic masses including large at least 14.5 cm dominant right hepatic lobe mass concerning for hepatocellular carcinoma and or metastatic disease. There is portal vein thrombosis. 2. Borderline to mild splenomegaly. Suspicion of upper abdominal varices. Large volume of ascites in the abdomen and pelvis. 3. Slightly dilated gallbladder with gallbladder wall thickening or edema, nonspecific and could be due to liver disease. Correlation with ultrasound if clinically appropriate. 4. Mild ground-glass density in the right lower lobe, possible pneumonia Electronically Signed   By: Donavan Foil M.D.   On: 09/07/2021 21:06   DG Chest Port 1 View  Result Date: 09/08/2021 CLINICAL DATA:  Chest pain.  Chest EXAM: PORTABLE CHEST 1 VIEW COMPARISON:  Radiograph dated 08/19/2009. FINDINGS: Shallow inspiration. No focal consolidation, pleural effusion, or pneumothorax. The cardiac silhouette is within normal limits. No acute osseous pathology. IMPRESSION: No active disease. Electronically Signed   By: Anner Crete M.D.   On: 09/08/2021 02:37     EKG: Independently reviewed, with result as described above.    Assessment/Plan   Principal Problem:   Cirrhosis ([place]) Active Problems:   Ascites   Abdominal pain   Thickening of wall of  gallbladder    #) Cirrhosis with ascites: New diagnosis of cirrhosis with ascites, per CT abdomen/pelvis this evening, will presentation concerning for hepatocellular carcinoma versus metastatic disease, as above.  Patient denies any history of alcohol abuse nor any recreational drug use, and no overt evidence of underlying viral illness that would be contributory, however, will check viral hepatitis panel to further assess.  Unable to calculate meld score at this time in the absence of INR result.  Presentation is associated with gallbladder wall thickening/distention of gallbladder, potentially reactive in the setting of aforementioned cirrhosis.  However, given borderline cholestatic pattern of presenting liver enzymes, will further assess with dedicated right upper quadrant ultrasound to evaluate for any overtly obstructive process.  will also need evaluation via definitive histopathological identification via biopsy.   Plan: Right upper quadrant ultrasound ordered.  Check INR.  Repeat CMP in the a.m., with direct bilirubin GGT.  Given concern for hepatocellular carcinoma, may benefit from a triple phase MRI of the liver.  I have also placed order forUltrasound-guided paracentesis via IR has been ordered for both diagnostic and therapeutic purposes, with the following associated peritoneal fluid labs ordered:  gram stain, culture, cell count with differential, albumin, amylase, cytology, glucose, pH, protein, triglyceride level.  I have also ordered serum LDH level.  Check serum acetaminophen level, urine drug screen, serum ethanol level acute  viral hepatitis panel. Prn fentanyl for pain.      DVT prophylaxis: SCD's   Code Status: Full code (per my discussion with patient) Family Communication: none Disposition Plan: Per Rounding Team Consults called: none;  Admission status: obs; med-tele   PLEASE NOTE THAT DRAGON DICTATION SOFTWARE WAS USED IN THE CONSTRUCTION OF THIS NOTE.   [place] DO [facility]  From [place]   09/08/2021, 3:54 AM

## 2021-09-08 NOTE — Hospital Course (Addendum)
Patient is a 51 year old man with no prior medical history, presented with progressive abdominal pain, distention, weight loss for over a month. Abdominal ultrasound, CT abdomen showed cirrhosis with ascites and liver lesions concerning for primary versus metastasis  1/13: Ultrasound-guided paracentesis ordered, GI consulted. MRCP showed possible multifocal hepatocellular carcinoma in the setting of cirrhosis, moderate volume ascites Oncology consulted

## 2021-09-08 NOTE — Assessment & Plan Note (Addendum)
-   Likely due to liver cirrhosis, low suspicion for SBP, culture showed no growth so far -Concern for malignancy given hepatic masses -AFP still pending, cytology from ascitic fluid pending

## 2021-09-08 NOTE — ED Notes (Signed)
Care assumed. Pt not currently in room. Pt in radiology.

## 2021-09-08 NOTE — Progress Notes (Addendum)
Progress Note   Patient: Patrick Tran YQM:[id number] DOB: August 06, 1971 DOA: 09/07/2021     0 DOS: the patient was seen and examined on 09/08/2021   Brief hospital course: Patient is a 51 year old man with no prior medical history, presented with progressive abdominal pain, distention, weight loss for over a month. Abdominal ultrasound, CT abdomen showed cirrhosis with ascites and liver lesions concerning for primary versus metastasis  1/13: Ultrasound-guided paracentesis ordered, GI consulted.  Assessment and Plan * Cirrhosis ([place])- new diagnosis- (present on admission) - Presented with acute abdominal pain, distention, loss of weight in the last month.  Has remote history of alcohol use in social settings.  Denies any known history of cirrhosis.  Denies recreational drug use. -RUQ US showed gallbladder wall thickening without cholelithiasis, hepatocellular disease with multiple hepatic masses with the largest measuring 4.8 x 4.2 cm, echogenic material in portal vein concerning for nonocclusive thrombus.  -Has large ascites, splenomegaly, coagulopathy, pancytopenia -Hepatitis panel negative, GI consulted -Ultrasound paracentesis ordered with labs, will follow studies  Liver lesion- (present on admission) - Unclear if primary liver hepatocellular CA versus metastasis. -Await cytology, will likely need biopsy for further work-up.  GI consulted, will follow recommendations  Abdominal pain- (present on admission) - Likely due to liver cirrhosis, low suspicion for SBP at this time, however await culture and ascitic fluid studies -Concern for malignancy given hepatic masses, will order AFP, may need biopsy.  Await GI recommendations -Continue pain control  Ascites- (present on admission) - New diagnosis, CT abdomen confirms liver cirrhosis with multiple hepatic masses including large at least 14.5 cm dominant right hepatic lobe mass concerning for hepatocellular carcinoma and or metastatic  disease, portal vein thrombosis -Ultrasound guided paracentesis today, will follow studies - GI consulted, will follow recommendations.  CAP (community acquired pneumonia)- (present on admission) - MRI + right lower lobe pneumonia  - placed on IV rocephin and azithromax     Subjective: States abdominal pain is improving with the pain medication.  Awaiting paracentesis.  Objective Temp:  [98.1 F (36.7 C)-98.5 F (36.9 C)] 98.3 F (36.8 C) (01/13 1800) Pulse Rate:  [62-86] 78 (01/13 1800) Resp:  [11-22] 12 (01/13 1800) BP: (101-146)/(65-111) 130/90 (01/13 1800) SpO2:  [97 %-100 %] 100 % (01/13 1800)   Physical Exam General: Alert and oriented x 3, NAD Cardiovascular: S1 S2 clear, RRR. No pedal edema b/l Respiratory: CTAB, no wheezing, rales or rhonchi Gastrointestinal: Soft, distended with mild diffuse TTP Ext: 1+ pedal edema bilaterally Neuro: no new deficits Skin: No rashes Psych: Normal affect and demeanor, alert and oriented x3   Data Reviewed: Ultrasound, CT abdomen pelvis reviewed  CMP Latest Ref Rng & Units 09/08/2021 09/07/2021 12/24/2013  Glucose 70 - 99 mg/dL 112(H) 84 117(H)  BUN 6 - 20 mg/dL 12 12 13   Creatinine 0.61 - 1.24 mg/dL 0.70 0.63 0.63  Sodium 135 - 145 mmol/L 135 134(L) 133(L)  Potassium 3.5 - 5.1 mmol/L 4.2 4.2 4.4  Chloride 98 - 111 mmol/L 105 103 96  CO2 22 - 32 mmol/L 24 25 25   Calcium 8.9 - 10.3 mg/dL 7.8(L) 8.3(L) 9.4  Total Protein 6.5 - 8.1 g/dL 6.8 8.4(H) -  Total Bilirubin 0.3 - 1.2 mg/dL 4.1(H) 4.6(H) -  Alkaline Phos 38 - 126 U/L 228(H) 314(H) -  AST 15 - 41 U/L 248(H) 310(H) -  ALT 0 - 44 U/L 119(H) 148(H) -    CBC Latest Ref Rng & Units 09/08/2021 09/07/2021 12/24/2013  WBC 4.0 - 10.5 K/uL 4.9  5.8 3.3(L)  Hemoglobin 13.0 - 17.0 g/dL 11.9(L) 14.0 14.8  Hematocrit 39.0 - 52.0 % 33.8(L) 38.8(L) 41.6  Platelets 150 - 400 K/uL 135(L) 146(L) 113(L)       Family Communication: Discussed with patient  Disposition: Status is:  Observation  The patient will require care spanning > 2 midnights and should be moved to inpatient because: New diagnosis of cirrhosis with large ascites, liver lesions, needs further work-up  DVT prophylaxis: SCDs   Time spent: 35 minutes  Author: Estill Cotta, MD 09/08/2021 6:09 PM  For on call review [email or web address].

## 2021-09-08 NOTE — Consult Note (Signed)
Referring Provider: [facility] PCP: Patient, No Pcp Per (Inactive)  Gastroenterologist: unassigned Reason for consultation:  cirrhosis, liver masses                 ASSESSMENT / PLAN   # 51 yo non-English speaking male from [place] admitted with abdominal pain, distention. CT scan showing cirrhosis ( new diagnosis). He has associated portal HTN with splenomegaly,  ascites. CTAP also showing multiple liver lesion and a vague dominant mass in the right hepatic lobe measuring 14.5 by 8.6 cm concerning for malignancy.  MELD--Na  14 -- Difficult time obtaining history.  Used interpreter on iPad. He speaks Garment/textile technologist.  Patient was difficult to keep on track to succinctly answer questions.  --obtain HCV PCR  to confirm presence of HCV.  --HCV ab positive. Will obtain HCV to confirm presence of HCV which is likely cause of cirrhosis.  --Not co-infected as HBV surface Ag is negative. HIV negative.  -- INR 1.4, platelets 135. --Normal renal function  --Ascites may need malignant,  --Paracentesis with gram stain, culture, cell count with differential, albumin, amylase, cytology, glucose, pH, protein, triglyceride level were already ordered by admitting team.  ---Will obtain MRI w/ [place] protocol. Depending on results he may need need biopsy of liver lesion if meets criteria for [place].  --Check AFP --Through interpreter I explained to the patient that he had cirrhosis of the liver, possibly related to hepatitis C virus. No prior knowledge of liver disease or hepatitis C. No known [place] of liver disease.  I also explained that he has a lesion which will require further evaluation and that we will start with an MRI but that he may require biopsy at some point.  --At some point will need EGD for esophageal varices screening --Clear liquids ok after paracentesis.   # Portal vein thrombosis, ? 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   HISTORY OF  PRESENT ILLNESS                                                                                                                         Chief Complaint: Abdominal pain, abdominal distention and new cirrhosis  Koi Brannen is a 51 y.o. male  with no significant past medical history until now .     ED course:  Patient presented to ED yesterday for evaluation of abdominal pain, nausea, weight loss, increased abdominal girth.   Patient from [place], [place] speaking.  Interpreter used for this consultation. Patient doesn't have any relatives in the [place]. He has some friends in the area.   Calin says that he began having abdominal discomfort and bloating sometime in December.  Symptoms got progressively worse and he then started having upper abdominal pain. He has had some occasional nausea but no vomiting.  No blood in stool or black stool.  Patient says he has never been a heavy consumer of alcohol.  He has no known family  history of liver cancer.  He has been in the [place] since 2008.    IMAGING:  CT Abdomen Pelvis W Contrast  Result Date: 09/07/2021 CLINICAL DATA:  Bloating with abdominal pain EXAM: CT ABDOMEN AND PELVIS WITH CONTRAST TECHNIQUE: Multidetector CT imaging of the abdomen and pelvis was performed using the standard protocol following bolus administration of intravenous contrast. CONTRAST:  46mL OMNIPAQUE IOHEXOL 350 MG/ML SOLN COMPARISON:  CT 08/19/2009 report FINDINGS: Lower chest: Lung bases demonstrate ground-glass density in the right lower lobe. No consolidation or effusion. Borderline cardiomegaly. Hepatobiliary: Nodular hepatic contour consistent with liver cirrhosis. Multiple heterogeneous liver masses with vague dominant mass in the right hepatic lobe measuring 14.5 by 8.6 cm. Distended gallbladder with probable gallbladder wall thickening. No calcified stone. No biliary dilatation. Pancreas: Unremarkable. No pancreatic ductal dilatation or surrounding inflammatory  changes. Spleen: Borderline enlarged at 13.2 cm AP. Adrenals/Urinary Tract: Adrenal glands are within normal limits. Kidneys show no hydronephrosis. Central renal hyperdensity may reflect early excretion of contrast. The bladder is unremarkable Stomach/Bowel: The stomach is nonenlarged. No dilated small bowel. No acute bowel wall thickening. Negative appendix. Left colon diverticular disease. Vascular/Lymphatic: Mild aortic atherosclerosis. No aneurysm. Thrombus within the right and main portal veins. Splenic vein appears grossly patent. Recanalized paraumbilical vein. Small perigastric varices. Reproductive: Prostate is unremarkable. Other: No free air.  Large volume of ascites. Musculoskeletal: No acute osseous abnormality. IMPRESSION: 1. Liver cirrhosis. Multiple hepatic masses including large at least 14.5 cm dominant right hepatic lobe mass concerning for hepatocellular carcinoma and or metastatic disease. There is portal vein thrombosis. 2. Borderline to mild splenomegaly. Suspicion of upper abdominal varices. Large volume of ascites in the abdomen and pelvis. 3. Slightly dilated gallbladder with gallbladder wall thickening or edema, nonspecific and could be due to liver disease. Correlation with ultrasound if clinically appropriate. 4. Mild ground-glass density in the right lower lobe, possible pneumonia Electronically Signed   By: Donavan Foil M.D.   On: 09/07/2021 21:06   DG Chest Port 1 View  Result Date: 09/08/2021 CLINICAL DATA:  Chest pain.  Chest EXAM: PORTABLE CHEST 1 VIEW COMPARISON:  Radiograph dated 08/19/2009. FINDINGS: Shallow inspiration. No focal consolidation, pleural effusion, or pneumothorax. The cardiac silhouette is within normal limits. No acute osseous pathology. IMPRESSION: No active disease. Electronically Signed   By: Anner Crete M.D.   On: 09/08/2021 02:37   US ABDOMEN LIMITED RUQ (LIVER/GB)  Result Date: 09/08/2021 CLINICAL DATA:  Gallbladder thickening on recent CT.  EXAM: ULTRASOUND ABDOMEN LIMITED RIGHT UPPER QUADRANT COMPARISON:  09/07/2021 FINDINGS: Gallbladder: No cholelithiasis. Positive sonographic Murphy sign. Gallbladder wall thickening measuring 6 mm. Pericholecystic fluid from the ascites. Common bile duct: Diameter: Not visualized Liver: Heterogeneous hepatic echotexture. Numerous masses within the liver with the largest in the right hepatic lobe measuring 4.8 x 4.2 cm and better evaluated on recent CT of the abdomen dated 09/07/2021. Echogenic material within the portal vein concerning for nonocclusive thrombus. Other: Moderate volume ascites IMPRESSION: 1. Gallbladder wall thickening without cholelithiasis. Differential considerations include acalculous cholecystitis, but this appearance can be seen in the setting of hepatocellular disease and ascites as is present in this patient. 2. Hepatocellular disease with multiple hepatic masses with the largest measuring 4.8 x 4.2 cm. Overall appearance is concerning for malignancy until proven otherwise. Recommend further evaluation with an MRI of the abdomen without and with intravenous contrast. 3. Echogenic material within the portal vein concerning for nonocclusive thrombus. Electronically Signed   By: Kathreen Devoid M.D.   On:  09/08/2021 07:11      History reviewed. No pertinent past medical history.  History reviewed. No pertinent surgical history.  Prior to Admission medications   Medication Sig Start Date End Date Taking? Authorizing Provider  acetaminophen (TYLENOL) 325 MG tablet Take 650 mg by mouth every 6 (six) hours as needed for mild pain.   Yes [provider]    Current Facility-Administered Medications  Medication Dose Route Frequency Provider Last Rate Last Admin   acetaminophen (TYLENOL) tablet 650 mg  650 mg Oral Q6H PRN Howerter, Justin B, DO       Or   acetaminophen (TYLENOL) suppository 650 mg  650 mg Rectal Q6H PRN Howerter, Justin B, DO       fentaNYL (SUBLIMAZE) injection  25 mcg  25 mcg Intravenous Q2H PRN Howerter, Justin B, DO   25 mcg at 09/08/21 0739   naloxone (NARCAN) injection 0.4 mg  0.4 mg Intravenous PRN Howerter, Justin B, DO       Current Outpatient Medications  Medication Sig Dispense Refill   acetaminophen (TYLENOL) 325 MG tablet Take 650 mg by mouth every 6 (six) hours as needed for mild pain.      Allergies as of 09/07/2021   (No Known Allergies)    Family History  Family history unknown: Yes    Social History   Socioeconomic History   Marital status: Single    Spouse name: Not on file   Number of children: Not on file   Years of education: Not on file   Highest education level: Not on file  Occupational History   Not on file  Tobacco Use   Smoking status: Never   Smokeless tobacco: Not on file  Substance and Sexual Activity   Alcohol use: Yes    Comment: social    Drug use: No   Sexual activity: Yes  Other Topics Concern   Not on file  Social History Narrative   Not on file   Social Determinants of Health   Financial Resource Strain: Not on file  Food Insecurity: Not on file  Transportation Needs: Not on file  Physical Activity: Not on file  Stress: Not on file  Social Connections: Not on file  Intimate Partner Violence: Not on file    Review of Systems: All systems reviewed and negative except where noted in HPI.   OBJECTIVE    Physical Exam: Vital signs in last 24 hours: Temp:  [98 F (36.7 C)-98.5 F (36.9 C)] 98.1 F (36.7 C) (01/12 2307) Pulse Rate:  [62-90] 65 (01/13 1015) Resp:  [16-22] 16 (01/13 0944) BP: (101-163)/(65-105) 114/77 (01/13 1015) SpO2:  [97 %-100 %] 98 % (01/13 1015)    General:  Alert thin male in NAD Psych:  Pleasant, cooperative. Normal mood and affect Eyes: Pupils equal, no icterus. Conjunctive pink Ears:  Normal auditory acuity Nose: No deformity, discharge or lesions Neck:  Supple, no masses felt Lungs:  Clear to auscultation.  Heart:  Regular rate, regular rhythm,  1+ BLE edema.  Abdomen:  Soft, largely distended, nontender, active bowel sounds,  Rectal :  Deferred Msk: Temporal muscle wasting. .  Neurologic:  Alert, oriented, grossly normal neurologically Skin:  Intact without significant lesions.    Scheduled inpatient medications   Intake/Output from previous day: No intake/output data recorded. Intake/Output this shift: No intake/output data recorded.   Lab Results: Recent Labs    09/07/21 1730 09/08/21 0520  WBC 5.8 4.9  HGB 14.0 11.9*  HCT 38.8* 33.8*  PLT 146* 135*   BMET Recent Labs    09/07/21 1730 09/08/21 0520  NA 134* 135  K 4.2 4.2  CL 103 105  CO2 25 24  GLUCOSE 84 112*  BUN 12 12  CREATININE 0.63 0.70  CALCIUM 8.3* 7.8*   LFTs Recent Labs    09/08/21 0520  PROT 6.8  ALBUMIN 1.7*  AST 248*  ALT 119*  ALKPHOS 228*  BILITOT 4.1*  BILIDIR 2.4*   PT/INR Recent Labs    09/08/21 0520  LABPROT 17.0*  INR 1.4*   Hepatitis Panel No results for input(s): HEPBSAG, HCVAB, HEPAIGM, HEPBIGM in the last 72 hours.   . CBC Latest Ref Rng & Units 09/08/2021 09/07/2021 12/24/2013  WBC 4.0 - 10.5 K/uL 4.9 5.8 3.3(L)  Hemoglobin 13.0 - 17.0 g/dL 11.9(L) 14.0 14.8  Hematocrit 39.0 - 52.0 % 33.8(L) 38.8(L) 41.6  Platelets 150 - 400 K/uL 135(L) 146(L) 113(L)    . CMP Latest Ref Rng & Units 09/08/2021 09/07/2021 12/24/2013  Glucose 70 - 99 mg/dL 112(H) 84 117(H)  BUN 6 - 20 mg/dL 12 12 13   Creatinine 0.61 - 1.24 mg/dL 0.70 0.63 0.63  Sodium 135 - 145 mmol/L 135 134(L) 133(L)  Potassium 3.5 - 5.1 mmol/L 4.2 4.2 4.4  Chloride 98 - 111 mmol/L 105 103 96  CO2 22 - 32 mmol/L 24 25 25   Calcium 8.9 - 10.3 mg/dL 7.8(L) 8.3(L) 9.4  Total Protein 6.5 - 8.1 g/dL 6.8 8.4(H) -  Total Bilirubin 0.3 - 1.2 mg/dL 4.1(H) 4.6(H) -  Alkaline Phos 38 - 126 U/L 228(H) 314(H) -  AST 15 - 41 U/L 248(H) 310(H) -  ALT 0 - 44 U/L 119(H) 148(H) -     Principal Problem:   Cirrhosis (HCC) Active Problems:   Ascites   Abdominal  pain   Thickening of wall of gallbladder    Tye Savoy, NP-C @  09/08/2021, 10:55 AM

## 2021-09-08 NOTE — Assessment & Plan Note (Addendum)
-   Unclear if primary liver hepatocellular CA versus metastasis. -Oncology was consulted, note by Dr. Alen Blew reviewed, will follow up outpatient.  I have also sent the ambulatory referral. -Patient will need close follow-up with oncology. GI signed off. -Cytology and AFB is still pending/in process

## 2021-09-09 DIAGNOSIS — Z20822 Contact with and (suspected) exposure to covid-19: Secondary | ICD-10-CM | POA: Diagnosis present

## 2021-09-09 DIAGNOSIS — C220 Liver cell carcinoma: Secondary | ICD-10-CM | POA: Diagnosis not present

## 2021-09-09 DIAGNOSIS — R180 Malignant ascites: Secondary | ICD-10-CM

## 2021-09-09 DIAGNOSIS — J189 Pneumonia, unspecified organism: Secondary | ICD-10-CM | POA: Diagnosis present

## 2021-09-09 DIAGNOSIS — R160 Hepatomegaly, not elsewhere classified: Secondary | ICD-10-CM | POA: Diagnosis present

## 2021-09-09 DIAGNOSIS — R1010 Upper abdominal pain, unspecified: Secondary | ICD-10-CM | POA: Diagnosis not present

## 2021-09-09 DIAGNOSIS — I81 Portal vein thrombosis: Secondary | ICD-10-CM | POA: Diagnosis present

## 2021-09-09 DIAGNOSIS — K766 Portal hypertension: Secondary | ICD-10-CM | POA: Diagnosis present

## 2021-09-09 DIAGNOSIS — R634 Abnormal weight loss: Secondary | ICD-10-CM | POA: Diagnosis present

## 2021-09-09 DIAGNOSIS — K7460 Unspecified cirrhosis of liver: Secondary | ICD-10-CM | POA: Diagnosis present

## 2021-09-09 DIAGNOSIS — R188 Other ascites: Secondary | ICD-10-CM | POA: Diagnosis present

## 2021-09-09 DIAGNOSIS — D61818 Other pancytopenia: Secondary | ICD-10-CM | POA: Diagnosis present

## 2021-09-09 LAB — CBC
HCT: 34.9 % — ABNORMAL LOW (ref 39.0–52.0)
Hemoglobin: 12.4 g/dL — ABNORMAL LOW (ref 13.0–17.0)
MCH: 33.1 pg (ref 26.0–34.0)
MCHC: 35.5 g/dL (ref 30.0–36.0)
MCV: 93.1 fL (ref 80.0–100.0)
Platelets: 182 10*3/uL (ref 150–400)
RBC: 3.75 MIL/uL — ABNORMAL LOW (ref 4.22–5.81)
RDW: 18.3 % — ABNORMAL HIGH (ref 11.5–15.5)
WBC: 5 10*3/uL (ref 4.0–10.5)
nRBC: 0 % (ref 0.0–0.2)

## 2021-09-09 LAB — COMPREHENSIVE METABOLIC PANEL
ALT: 122 U/L — ABNORMAL HIGH (ref 0–44)
AST: 259 U/L — ABNORMAL HIGH (ref 15–41)
Albumin: 1.7 g/dL — ABNORMAL LOW (ref 3.5–5.0)
Alkaline Phosphatase: 253 U/L — ABNORMAL HIGH (ref 38–126)
Anion gap: 8 (ref 5–15)
BUN: 12 mg/dL (ref 6–20)
CO2: 23 mmol/L (ref 22–32)
Calcium: 7.9 mg/dL — ABNORMAL LOW (ref 8.9–10.3)
Chloride: 106 mmol/L (ref 98–111)
Creatinine, Ser: 0.6 mg/dL — ABNORMAL LOW (ref 0.61–1.24)
GFR, Estimated: >60 mL/min (ref >60)
Glucose, Bld: 80 mg/dL (ref 70–99)
Potassium: 4.1 mmol/L (ref 3.5–5.1)
Sodium: 137 mmol/L (ref 135–145)
Total Bilirubin: 3.8 mg/dL — ABNORMAL HIGH (ref 0.3–1.2)
Total Protein: 7.3 g/dL (ref 6.5–8.1)

## 2021-09-09 LAB — BASIC METABOLIC PANEL
Anion gap: 8 (ref 5–15)
BUN: 12 mg/dL (ref 6–20)
CO2: 23 mmol/L (ref 22–32)
Calcium: 7.8 mg/dL — ABNORMAL LOW (ref 8.9–10.3)
Chloride: 105 mmol/L (ref 98–111)
Creatinine, Ser: 0.69 mg/dL (ref 0.61–1.24)
GFR, Estimated: >60 mL/min (ref >60)
Glucose, Bld: 72 mg/dL (ref 70–99)
Potassium: 4 mmol/L (ref 3.5–5.1)
Sodium: 136 mmol/L (ref 135–145)

## 2021-09-09 LAB — ACETAMINOPHEN LEVEL: Acetaminophen (Tylenol), Serum: <10 ug/mL — ABNORMAL LOW (ref 10–30)

## 2021-09-09 LAB — ETHANOL: Alcohol, Ethyl (B): <10 mg/dL (ref <10)

## 2021-09-09 MED ORDER — SPIRONOLACTONE 100 MG PO TABS
100.0000 mg | ORAL_TABLET | Freq: Every day | ORAL | Status: DC
  Administered 2021-09-09 – 2021-09-10 (×2): 100 mg via ORAL
  Filled 2021-09-09 (×2): qty 1

## 2021-09-09 MED ORDER — AZITHROMYCIN 250 MG PO TABS
500.0000 mg | ORAL_TABLET | Freq: Every day | ORAL | Status: DC
  Administered 2021-09-09: 500 mg via ORAL
  Filled 2021-09-09: qty 2

## 2021-09-09 MED ORDER — FUROSEMIDE 40 MG PO TABS
40.0000 mg | ORAL_TABLET | Freq: Every day | ORAL | Status: DC
  Administered 2021-09-09 – 2021-09-10 (×2): 40 mg via ORAL
  Filled 2021-09-09 (×2): qty 1

## 2021-09-09 NOTE — Progress Notes (Signed)
Consult received from Dr. Hilarie Fredrickson.  The case was discussed with him via phone.  The radiological findings are suspicious for multifocal hepatocellular carcinoma although AFP is currently pending.  I see no urgent oncology issues at this time.  We will arrange outpatient follow-up and evaluation upon his discharge.  We will also facilitate his case to be presented at GI tumor board.

## 2021-09-09 NOTE — Progress Notes (Signed)
°  Progress Note   Patient: Patrick Tran ZOX:[id number] DOB: July 08, 1971 DOA: 09/07/2021     0 DOS: the patient was seen and examined on 09/09/2021   Brief hospital course: Patient is a 51 year old man with no prior medical history, presented with progressive abdominal pain, distention, weight loss for over a month. Abdominal ultrasound, CT abdomen showed cirrhosis with ascites and liver lesions concerning for primary versus metastasis  1/13: Ultrasound-guided paracentesis ordered, GI consulted. MRCP showed possible multifocal hepatocellular carcinoma in the setting of cirrhosis, moderate volume ascites Oncology consulted  Assessment and Plan * Cirrhosis ([place])- new diagnosis- (present on admission) - Presented with acute abdominal pain, distention, loss of weight in the last month.  Has remote history of alcohol use in social settings, no prior history of cirrhosis.  Denies recreational drug use. -RUQ US showed gallbladder wall thickening without cholelithiasis, hepatocellular disease with multiple hepatic masses with the largest measuring 4.8 x 4.2 cm, echogenic material in portal vein concerning for nonocclusive thrombus.  -Has large ascites, splenomegaly, coagulopathy, pancytopenia -Hepatitis panel negative, GI following  Liver lesion- (present on admission) - Unclear if primary liver hepatocellular CA versus metastasis. -Oncology was consulted, note by Dr. Alen Blew reviewed.  -Awaiting AFP, cytology, recommended outpatient follow-up and evaluation  Abdominal pain- (present on admission) - Likely due to liver cirrhosis, low suspicion for SBP, culture showed no growth so far -Concern for malignancy given hepatic masses, AFP still pending  Ascites- (present on admission) - New diagnosis, CT abdomen confirms liver cirrhosis with multiple hepatic masses including large at least 14.5 cm dominant right hepatic lobe mass concerning for hepatocellular carcinoma and or metastatic disease,  portal vein thrombosis -Status post paracentesis 1.3 L removed, cytology pending  CAP (community acquired pneumonia)- (present on admission) - MRI + right lower lobe pneumonia  -Continue Zithromax, Rocephin  Pancytopenia ([place])- (present on admission) - With thrombocytopenia, coagulopathy, likely due to liver disease -Monitor counts, transfuse as needed     Subjective: Still has abdominal distention, abdominal pain is controlled.  No acute chest pain or shortness of breath.  No fevers.  Objective Vitals:   09/09/21 0416 09/09/21 0919  BP: 123/75 113/68  Pulse: 93 88  Resp: 17 17  Temp: 98.9 F (37.2 C) 98 F (36.7 C)  SpO2: 98% 98%    Physical Exam General: Alert and oriented x 3, NAD Cardiovascular: S1 S2 clear, RRR. No pedal edema b/l Respiratory: CTAB, no wheezing, rales or rhonchi Gastrointestinal: Soft, distended, NBS Ext: no pedal edema bilaterally   Data Reviewed: MRCP reviewed, documented in note  Results are pending, will review when available.  AFP  Family Communication: Patient does not want any family to be called, states all of them in his native country  Disposition: Status is: Inpatient  Remains inpatient appropriate because: Work-up in progress  Time spent: 35 minutes  Author: Estill Cotta, MD 09/09/2021 3:41 PM  For on call review [email or web address].

## 2021-09-09 NOTE — Progress Notes (Signed)
Progress Note Hospital Day: 3  Chief Complaint:  cirrhosis, liver lesions       ASSESSMENT AND PLAN   # 51 yo non-English speaking male from [place] admitted with abdominal pain, distention. 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 CTAP also showing multiple liver lesions. and a vague dominant mass in the right hepatic lobe measuring 14.5 by 8.6 cm concerning for malignancy.  MELD--Na  14 --MRI most c/w multifocal hepatocellular carcinoma in setting of cirrhosis. AFP pending. We spoke with Dr. Alen Blew his am, he will coordinate outpatient Oncology follow up.  --Extended discussion via interpreter about diagnosis. He understands that the cancer center and our office will be in contact about an appts.  --Recommend 2 gram sodium restricted diet upon discharge.   --He is s/p removal of 1.3 liter of ascitic fluid yesterday. No SBP by cell count  --Started Lasix 40 mg daily  / Aldactone 100 mg daily for ascites --Will send message to office nurse to arrange follow up with me to help with ascites management.  --Patient speaks [place]. To help with communication surrounding outpatient appointments I got contact information of one of patient's good friends who speaks [place]. Patient is comfortable with Providers talking to this person whose name is Azucena Freed ( not totally sure about spelling of second name). His number is [phone]. I will add this to list of contacts in Epic     # RLL on MRI  DIAGNOSTIC STUDIES THIS ADMISSION:  MRI IMPRESSION: 1. Moderate to markedly motion degraded exam. Per technologist notes, the patient could not follow breath hold directions secondary to language barrier. 2. Findings most consistent with multifocal hepatocellular carcinoma in the setting of cirrhosis. Partial portal vein thrombosis, as delineated above. 3. Moderate volume ascites. 4. Right lower lobe  pneumonia   SUBJECTIVE       Overall abdominal pain is better but still gets pain if he eats a lot at one time.   OBJECTIVE      Scheduled inpatient medications:   azithromycin  500 mg Oral Daily   furosemide  40 mg Oral Daily   spironolactone  100 mg Oral Daily   Continuous inpatient infusions:   cefTRIAXone (ROCEPHIN)  IV 2 g (09/08/21 2101)   PRN inpatient medications: acetaminophen **OR** acetaminophen, fentaNYL (SUBLIMAZE) injection, naLOXone (NARCAN)  injection  Vital signs in last 24 hours: Temp:  [98 F (36.7 C)-98.9 F (37.2 C)] 98 F (36.7 C) (01/14 0919) Pulse Rate:  [64-93] 88 (01/14 0919) Resp:  [12-19] 17 (01/14 0919) BP: (113-143)/(68-111) 113/68 (01/14 0919) SpO2:  [98 %-100 %] 98 % (01/14 0919) Last BM Date: 09/08/21  Intake/Output Summary (Last 24 hours) at 09/09/2021 1458 Last data filed at 09/09/2021 0930 Gross per 24 hour  Intake 290 ml  Output --  Net 290 ml     Physical Exam:  General: Alert male in NAD Heart:  Regular rate and rhythm.  Pulmonary: Normal respiratory effort Abdomen: Soft, moderately distended, nontender. Normal bowel sounds.  Neurologic: Alert and oriented Psych: Pleasant. Cooperative.   There were no vitals filed for this visit.  Intake/Output from previous day: No intake/output data recorded. Intake/Output this shift: Total I/O In: 290 [P.O.:290] Out: -     Lab Results: Recent Labs    09/07/21 1730 09/08/21 0520 09/09/21 0336  WBC 5.8 4.9 5.0  HGB 14.0 11.9* 12.4*  HCT 38.8* 33.8* 34.9*  PLT  146* 135* 182   BMET Recent Labs    09/08/21 0520 09/09/21 0336 09/09/21 0921  NA 135 137 136  K 4.2 4.1 4.0  CL 105 106 105  CO2 24 23 23   GLUCOSE 112* 80 72  BUN 12 12 12   CREATININE 0.70 0.60* 0.69  CALCIUM 7.8* 7.9* 7.8*   LFT Recent Labs    09/08/21 0520 09/09/21 0336  PROT 6.8 7.3  ALBUMIN 1.7* 1.7*  AST 248* 259*  ALT 119* 122*  ALKPHOS 228* 253*  BILITOT 4.1* 3.8*  BILIDIR 2.4*  --     PT/INR Recent Labs    09/08/21 0520  LABPROT 17.0*  INR 1.4*   Hepatitis Panel Recent Labs    09/08/21 0315  HEPBSAG NON REACTIVE  HCVAB Reactive*  HEPAIGM NON REACTIVE  HEPBIGM NON REACTIVE    MR ABDOMEN W WO CONTRAST  Result Date: 09/08/2021 CLINICAL DATA:  Ultrasound and CT demonstrating liver masses. Hepatocellular carcinoma suspected. Cytopenia. EXAM: MRI ABDOMEN WITHOUT AND WITH CONTRAST TECHNIQUE: Multiplanar multisequence MR imaging of the abdomen was performed both before and after the administration of intravenous contrast. CONTRAST:  6.79mL GADAVIST GADOBUTROL 1 MMOL/ML IV SOLN COMPARISON:  Ultrasound of earlier today's.  CT of 1 day prior. FINDINGS: Moderate to markedly motion degraded exam throughout. Lower chest: Right base pneumonia.  Normal heart size. Hepatobiliary: Moderate cirrhosis.  Multiple bilateral liver masses. A dominant mass, centered in segments 7 and 8, with minimal extension into the segment 4A, measures on the order of 10.7 x 8.9 by 9.7 cm on 17/3 and 16/2. This demonstrates heterogeneous arterial phase hyperenhancement on series 801, delayed hypoenhancement with an enhancing capsule including on 49/10804. This causes right portal vein thrombus with minimal extension in the main portal vein. More apparent on CT. Considered LR 5/TIV. Multiple other lesions with arterial phase hyperenhancement and delayed hypoenhancement are identified. -Examples include at 1.5 cm in segment 4A on 37/801. Central hypoenhancement including on image 44 delayed. Considered LR 4. -Within segment 2 at 1.7 cm on 40/801. Demonstrates central hypoenhancement and enhancing capsule on delayed subtracted image 40/10803. LR 5. Gallbladder distension, without acute cholecystitis or biliary duct dilatation. Pancreas:  Normal, without mass or ductal dilatation. Spleen:  Normal in size, without focal abnormality. Adrenals/Urinary Tract: Normal adrenal glands. Normal kidneys, without hydronephrosis.  Stomach/Bowel: Normal stomach and abdominal bowel loops. Vascular/Lymphatic: Aortic atherosclerosis. Right portal vein thrombus as detailed above. No abdominal adenopathy. Other:  Moderate volume ascites. Musculoskeletal: No acute osseous abnormality. IMPRESSION: 1. Moderate to markedly motion degraded exam. Per technologist notes, the patient could not follow breath hold directions secondary to language barrier. 2. Findings most consistent with multifocal hepatocellular carcinoma in the setting of cirrhosis. Partial portal vein thrombosis, as delineated above. 3. Moderate volume ascites. 4. Right lower lobe pneumonia. Electronically Signed   By: Abigail Miyamoto M.D.   On: 09/08/2021 15:55   CT Abdomen Pelvis W Contrast  Result Date: 09/07/2021 CLINICAL DATA:  Bloating with abdominal pain EXAM: CT ABDOMEN AND PELVIS WITH CONTRAST TECHNIQUE: Multidetector CT imaging of the abdomen and pelvis was performed using the standard protocol following bolus administration of intravenous contrast. CONTRAST:  78mL OMNIPAQUE IOHEXOL 350 MG/ML SOLN COMPARISON:  CT 08/19/2009 report FINDINGS: Lower chest: Lung bases demonstrate ground-glass density in the right lower lobe. No consolidation or effusion. Borderline cardiomegaly. Hepatobiliary: Nodular hepatic contour consistent with liver cirrhosis. Multiple heterogeneous liver masses with vague dominant mass in the right hepatic lobe measuring 14.5 by 8.6 cm. Distended gallbladder with  probable gallbladder wall thickening. No calcified stone. No biliary dilatation. Pancreas: Unremarkable. No pancreatic ductal dilatation or surrounding inflammatory changes. Spleen: Borderline enlarged at 13.2 cm AP. Adrenals/Urinary Tract: Adrenal glands are within normal limits. Kidneys show no hydronephrosis. Central renal hyperdensity may reflect early excretion of contrast. The bladder is unremarkable Stomach/Bowel: The stomach is nonenlarged. No dilated small bowel. No acute bowel wall  thickening. Negative appendix. Left colon diverticular disease. Vascular/Lymphatic: Mild aortic atherosclerosis. No aneurysm. Thrombus within the right and main portal veins. Splenic vein appears grossly patent. Recanalized paraumbilical vein. Small perigastric varices. Reproductive: Prostate is unremarkable. Other: No free air.  Large volume of ascites. Musculoskeletal: No acute osseous abnormality. IMPRESSION: 1. Liver cirrhosis. Multiple hepatic masses including large at least 14.5 cm dominant right hepatic lobe mass concerning for hepatocellular carcinoma and or metastatic disease. There is portal vein thrombosis. 2. Borderline to mild splenomegaly. Suspicion of upper abdominal varices. Large volume of ascites in the abdomen and pelvis. 3. Slightly dilated gallbladder with gallbladder wall thickening or edema, nonspecific and could be due to liver disease. Correlation with ultrasound if clinically appropriate. 4. Mild ground-glass density in the right lower lobe, possible pneumonia Electronically Signed   By: Donavan Foil M.D.   On: 09/07/2021 21:06   DG Chest Port 1 View  Result Date: 09/08/2021 CLINICAL DATA:  Chest pain.  Chest EXAM: PORTABLE CHEST 1 VIEW COMPARISON:  Radiograph dated 08/19/2009. FINDINGS: Shallow inspiration. No focal consolidation, pleural effusion, or pneumothorax. The cardiac silhouette is within normal limits. No acute osseous pathology. IMPRESSION: No active disease. Electronically Signed   By: Anner Crete M.D.   On: 09/08/2021 02:37   US ABDOMEN LIMITED RUQ (LIVER/GB)  Result Date: 09/08/2021 CLINICAL DATA:  Gallbladder thickening on recent CT. EXAM: ULTRASOUND ABDOMEN LIMITED RIGHT UPPER QUADRANT COMPARISON:  09/07/2021 FINDINGS: Gallbladder: No cholelithiasis. Positive sonographic Murphy sign. Gallbladder wall thickening measuring 6 mm. Pericholecystic fluid from the ascites. Common bile duct: Diameter: Not visualized Liver: Heterogeneous hepatic echotexture. Numerous  masses within the liver with the largest in the right hepatic lobe measuring 4.8 x 4.2 cm and better evaluated on recent CT of the abdomen dated 09/07/2021. Echogenic material within the portal vein concerning for nonocclusive thrombus. Other: Moderate volume ascites IMPRESSION: 1. Gallbladder wall thickening without cholelithiasis. Differential considerations include acalculous cholecystitis, but this appearance can be seen in the setting of hepatocellular disease and ascites as is present in this patient. 2. Hepatocellular disease with multiple hepatic masses with the largest measuring 4.8 x 4.2 cm. Overall appearance is concerning for malignancy until proven otherwise. Recommend further evaluation with an MRI of the abdomen without and with intravenous contrast. 3. Echogenic material within the portal vein concerning for nonocclusive thrombus. Electronically Signed   By: Kathreen Devoid M.D.   On: 09/08/2021 07:11   IR Paracentesis  Result Date: 09/08/2021 INDICATION: Patient with history of progressive abdominal pain, distension, evidence of ascites. Request is made for diagnostic and therapeutic paracentesis. EXAM: ULTRASOUND GUIDED DIAGNOSTIC AND THERAPEUTIC PARACENTESIS MEDICATIONS: 10 mL 1% lidocaine COMPLICATIONS: None immediate. PROCEDURE: Informed written consent was obtained from the patient after a discussion of the risks, benefits and alternatives to treatment. A timeout was performed prior to the initiation of the procedure. Initial ultrasound scanning demonstrates a large amount of ascites within the right lower abdominal quadrant. The right lower abdomen was prepped and draped in the usual sterile fashion. 1% lidocaine was used for local anesthesia. Following this, a 19 gauge, 7-cm, Yueh catheter was introduced. An ultrasound  image was saved for documentation purposes. Despite ongoing presence of fluid by imaging, the catheter stopped working. Catheter was removed and found to be kinked. A new 19  gauge, 7 cm Yueh was replaced. The paracentesis was performed. The catheter was removed and a dressing was applied. The patient tolerated the procedure well without immediate post procedural complication. FINDINGS: A total of approximately 1.3 liters of blood-tinged fluid was removed. Samples were sent to the laboratory as requested by the clinical team. IMPRESSION: Successful ultrasound-guided diagnostic and therapeutic paracentesis yielding 1.3 liters of peritoneal fluid. Read by: Brynda Greathouse PA-C Electronically Signed   By: Ruthann Cancer M.D.   On: 09/08/2021 16:45        Principal Problem:   Cirrhosis ([place])- new diagnosis Active Problems:   Ascites   Abdominal pain   Liver lesion   Pancytopenia ([place])   CAP (community acquired pneumonia)     LOS: 0 days   Tye Savoy ,NP 09/09/2021, 2:58 PM

## 2021-09-09 NOTE — Assessment & Plan Note (Signed)
-   With thrombocytopenia, coagulopathy, likely due to liver disease -Monitor counts, transfuse as needed

## 2021-09-10 DIAGNOSIS — C220 Liver cell carcinoma: Secondary | ICD-10-CM

## 2021-09-10 LAB — HCV RNA QUANT RFLX ULTRA OR GENOTYP
HCV RNA Qnt(log copy/mL): 5.961 log10 IU/mL
HepC Qn: 915000 IU/mL

## 2021-09-10 LAB — TRIGLYCERIDES, BODY FLUIDS: Triglycerides, Fluid: 37 mg/dL

## 2021-09-10 LAB — BASIC METABOLIC PANEL
Anion gap: 5 (ref 5–15)
BUN: 14 mg/dL (ref 6–20)
CO2: 24 mmol/L (ref 22–32)
Calcium: 7.6 mg/dL — ABNORMAL LOW (ref 8.9–10.3)
Chloride: 106 mmol/L (ref 98–111)
Creatinine, Ser: 0.64 mg/dL (ref 0.61–1.24)
GFR, Estimated: >60 mL/min (ref >60)
Glucose, Bld: 85 mg/dL (ref 70–99)
Potassium: 3.9 mmol/L (ref 3.5–5.1)
Sodium: 135 mmol/L (ref 135–145)

## 2021-09-10 LAB — HEPATITIS C GENOTYPE: Hepatitis C Genotype: 4

## 2021-09-10 LAB — AFP TUMOR MARKER

## 2021-09-10 MED ORDER — TRAMADOL HCL 50 MG PO TABS: 50.0000 mg | ORAL_TABLET | Freq: Four times a day (QID) | ORAL | 0 refills | Status: DC | PRN

## 2021-09-10 MED ORDER — AMOXICILLIN-POT CLAVULANATE 875-125 MG PO TABS
1.0000 | ORAL_TABLET | Freq: Two times a day (BID) | ORAL | Status: DC
  Administered 2021-09-10: 1 via ORAL
  Filled 2021-09-10: qty 1

## 2021-09-10 MED ORDER — FUROSEMIDE 40 MG PO TABS: 40.0000 mg | ORAL_TABLET | Freq: Every day | ORAL | 3 refills | Status: DC

## 2021-09-10 MED ORDER — AMOXICILLIN-POT CLAVULANATE 875-125 MG PO TABS: 1.0000 | ORAL_TABLET | Freq: Two times a day (BID) | ORAL | 0 refills | Status: AC

## 2021-09-10 MED ORDER — SPIRONOLACTONE 100 MG PO TABS: 100.0000 mg | ORAL_TABLET | Freq: Every day | ORAL | 3 refills | Status: DC

## 2021-09-10 NOTE — Discharge Summary (Signed)
Physician Discharge Summary   Patient: Patrick Tran MRN: [id number] DOB: 03-11-1971  Admit date:     09/07/2021  Discharge date: 09/10/21  Discharge Physician: Estill Cotta   PCP: Patient, No Pcp Per (Inactive)   Recommendations at discharge:  Patient started on Lasix 40 mg daily, spironolactone 100 mg daily, creatinine stable, so far tolerating  AFP, cytology from the ascitic fluid still in process.    Ambulatory referral sent to oncology office, patient has been reviewed by Dr. Alen Blew.  Requested appointment within 7 to 10 days to follow-up on AFP, ascitic fluid cytology and further management  Patient will follow-up with GI in office in the next 7 to 10 days to coordinate care for ascites, new cirrhosis and probable HCC.  Discharge Diagnoses    Cirrhosis ([place])- new diagnosis   Ascites   Abdominal pain   Liver lesion   Pancytopenia ([place])   CAP (community acquired pneumonia)   Hepatocellular carcinoma [facility])     Hospital Course   Patient is a 51 year old man with no prior medical history, presented with progressive abdominal pain, distention, weight loss for over a month. Abdominal ultrasound, CT abdomen showed cirrhosis with ascites and liver lesions concerning for primary versus metastasis 1/13: Ultrasound-guided paracentesis ordered, GI consulted. MRCP showed possible multifocal hepatocellular carcinoma in the setting of cirrhosis, moderate volume ascites   * Cirrhosis ([place])- new diagnosis- (present on admission) - Presented with acute abdominal pain, distention, loss of weight in the last month.  Has remote history of alcohol use in social settings, no prior history of cirrhosis.  Denies recreational drug use. -RUQ US showed gallbladder wall thickening without cholelithiasis, hepatocellular disease with multiple hepatic masses with the largest measuring 4.8 x 4.2 cm, echogenic material in portal vein concerning for nonocclusive thrombus.  -Has large ascites,  splenomegaly, coagulopathy, pancytopenia - status post 1.3 L paracentesis, no SBP -GI following, started on Lasix 40 mg daily, Aldactone 100 mg daily, outpatient follow-up with GI.  Liver lesion- (present on admission) - Unclear if primary liver hepatocellular CA versus metastasis. -Oncology was consulted, note by Dr. Alen Blew reviewed, will follow up outpatient.  I have also sent the ambulatory referral. -Patient will need close follow-up with oncology. GI signed off. -Cytology and AFB is still pending/in process   Abdominal pain- (present on admission) - Likely due to liver cirrhosis, low suspicion for SBP, culture showed no growth so far -Concern for malignancy given hepatic masses -AFP still pending, cytology from ascitic fluid pending  Ascites- (present on admission) - New diagnosis, CT abdomen confirms liver cirrhosis with multiple hepatic masses including large at least 14.5 cm dominant right hepatic lobe mass concerning for [facility] and/or metastatic disease, portal vein thrombosis -Status post paracentesis 1.3 L removed, cytology pending, studies showed no SBP.  Cytology pending. -Started on Lasix 40 mg daily, Aldactone 100 mg daily, fluid restriction  CAP (community acquired pneumonia)- (present on admission) - MRI + right lower lobe pneumonia  -DC of IV Zithromax and Rocephin, transition to oral Augmentin to complete course.  Pancytopenia ([place])- (present on admission) - With thrombocytopenia, coagulopathy, likely due to liver disease -Monitor counts, transfuse as needed    Pain control - [facility] Controlled Substance Reporting System database was reviewed. and patient was instructed, not to drive, operate heavy machinery, perform activities at heights, swimming or participation in water activities or provide baby-sitting services while on Pain, Sleep and Anxiety Medications; until their outpatient Physician has advised to do so again. Also recommended to not to  take more than  prescribed Pain, Sleep and Anxiety Medications.   Consultants:  Gastroenterology, Dr. Hilarie Fredrickson Oncology, Dr. Alen Blew  Procedures performed: Paracentesis Disposition: Home Diet recommendation: Regular diet  DISCHARGE MEDICATION: Allergies as of 09/10/2021   No Known Allergies      Medication List     STOP taking these medications    acetaminophen 325 MG tablet Commonly known as: TYLENOL   cyclobenzaprine 5 MG tablet Commonly known as: FLEXERIL   naproxen 500 MG tablet Commonly known as: NAPROSYN       TAKE these medications    amoxicillin-clavulanate 875-125 MG tablet Commonly known as: AUGMENTIN Take 1 tablet by mouth 2 (two) times daily for 5 days.   furosemide 40 MG tablet Commonly known as: LASIX Take 1 tablet (40 mg total) by mouth daily. Start taking on: September 11, 2021   spironolactone 100 MG tablet Commonly known as: ALDACTONE Take 1 tablet (100 mg total) by mouth daily. Start taking on: September 11, 2021   traMADol 50 MG tablet Commonly known as: Ultram Take 1 tablet (50 mg total) by mouth every 6 (six) hours as needed for moderate pain or severe pain.        Follow-up Information     Pyrtle, Lajuan Lines, MD. Schedule an appointment as soon as possible for a visit in 10 day(s).   Specialty: Gastroenterology Why: Please call the office on Tuesday, 09/12/2020 to make appointment within the next 7 to 10 days Contact information: [address] [place] [address] [phone]         Wyatt Portela, MD Follow up in 10 day(s).   Specialty: Oncology Why: Please call the office on Tuesday, 09/12/2020 to make appointment within the next 7 to 10 days.  Referral has been sent to the office. Contact information: [place] [address] [phone]         [place]. Schedule an appointment as soon as possible for a visit in 2 week(s).   Why: for hospital follow-up.  This is for primary  care physician appointment, you can walk in as well. Contact information: [address] [place] [address] [phone]                Discharge Exam: Danley Danker Weights   09/10/21 0500  Weight: 59.6 kg   Vitals:   09/10/21 0450 09/10/21 0819  BP: 117/74 134/87  Pulse: 72 78  Resp: 18 17  Temp: 98.3 F (36.8 C) 97.9 F (36.6 C)  SpO2: 99% 98%    Feeling better today, no acute complaints, awaiting diagnosis   Physical Exam General: Alert and oriented x 3, NAD Cardiovascular: S1 S2 clear, RRR. No pedal edema b/l Respiratory: CTAB, no wheezing, rales or rhonchi Gastrointestinal: Soft, nontender, +distended, NBS Ext: no pedal edema bilaterally    Condition at discharge: stable  The results of significant diagnostics from this hospitalization (including imaging, microbiology, ancillary and laboratory) are listed below for reference.   Imaging Studies: MR ABDOMEN W WO CONTRAST  Result Date: 09/08/2021 CLINICAL DATA:  Ultrasound and CT demonstrating liver masses. Hepatocellular carcinoma suspected. Cytopenia. EXAM: MRI ABDOMEN WITHOUT AND WITH CONTRAST TECHNIQUE: Multiplanar multisequence MR imaging of the abdomen was performed both before and after the administration of intravenous contrast. CONTRAST:  6.57mL GADAVIST GADOBUTROL 1 MMOL/ML IV SOLN COMPARISON:  Ultrasound of earlier today's.  CT of 1 day prior. FINDINGS: Moderate to markedly motion degraded exam throughout. Lower chest: Right base  pneumonia.  Normal heart size. Hepatobiliary: Moderate cirrhosis.  Multiple bilateral liver masses. A dominant mass, centered in segments 7 and 8, with minimal extension into the segment 4A, measures on the order of 10.7 x 8.9 by 9.7 cm on 17/3 and 16/2. This demonstrates heterogeneous arterial phase hyperenhancement on series 801, delayed hypoenhancement with an enhancing capsule including on 49/10804. This causes right portal vein thrombus with minimal extension  in the main portal vein. More apparent on CT. Considered LR 5/TIV. Multiple other lesions with arterial phase hyperenhancement and delayed hypoenhancement are identified. -Examples include at 1.5 cm in segment 4A on 37/801. Central hypoenhancement including on image 44 delayed. Considered LR 4. -Within segment 2 at 1.7 cm on 40/801. Demonstrates central hypoenhancement and enhancing capsule on delayed subtracted image 40/10803. LR 5. Gallbladder distension, without acute cholecystitis or biliary duct dilatation. Pancreas:  Normal, without mass or ductal dilatation. Spleen:  Normal in size, without focal abnormality. Adrenals/Urinary Tract: Normal adrenal glands. Normal kidneys, without hydronephrosis. Stomach/Bowel: Normal stomach and abdominal bowel loops. Vascular/Lymphatic: Aortic atherosclerosis. Right portal vein thrombus as detailed above. No abdominal adenopathy. Other:  Moderate volume ascites. Musculoskeletal: No acute osseous abnormality. IMPRESSION: 1. Moderate to markedly motion degraded exam. Per technologist notes, the patient could not follow breath hold directions secondary to language barrier. 2. Findings most consistent with multifocal hepatocellular carcinoma in the setting of cirrhosis. Partial portal vein thrombosis, as delineated above. 3. Moderate volume ascites. 4. Right lower lobe pneumonia. Electronically Signed   By: Abigail Miyamoto M.D.   On: 09/08/2021 15:55   CT Abdomen Pelvis W Contrast  Result Date: 09/07/2021 CLINICAL DATA:  Bloating with abdominal pain EXAM: CT ABDOMEN AND PELVIS WITH CONTRAST TECHNIQUE: Multidetector CT imaging of the abdomen and pelvis was performed using the standard protocol following bolus administration of intravenous contrast. CONTRAST:  39mL OMNIPAQUE IOHEXOL 350 MG/ML SOLN COMPARISON:  CT 08/19/2009 report FINDINGS: Lower chest: Lung bases demonstrate ground-glass density in the right lower lobe. No consolidation or effusion. Borderline cardiomegaly.  Hepatobiliary: Nodular hepatic contour consistent with liver cirrhosis. Multiple heterogeneous liver masses with vague dominant mass in the right hepatic lobe measuring 14.5 by 8.6 cm. Distended gallbladder with probable gallbladder wall thickening. No calcified stone. No biliary dilatation. Pancreas: Unremarkable. No pancreatic ductal dilatation or surrounding inflammatory changes. Spleen: Borderline enlarged at 13.2 cm AP. Adrenals/Urinary Tract: Adrenal glands are within normal limits. Kidneys show no hydronephrosis. Central renal hyperdensity may reflect early excretion of contrast. The bladder is unremarkable Stomach/Bowel: The stomach is nonenlarged. No dilated small bowel. No acute bowel wall thickening. Negative appendix. Left colon diverticular disease. Vascular/Lymphatic: Mild aortic atherosclerosis. No aneurysm. Thrombus within the right and main portal veins. Splenic vein appears grossly patent. Recanalized paraumbilical vein. Small perigastric varices. Reproductive: Prostate is unremarkable. Other: No free air.  Large volume of ascites. Musculoskeletal: No acute osseous abnormality. IMPRESSION: 1. Liver cirrhosis. Multiple hepatic masses including large at least 14.5 cm dominant right hepatic lobe mass concerning for hepatocellular carcinoma and or metastatic disease. There is portal vein thrombosis. 2. Borderline to mild splenomegaly. Suspicion of upper abdominal varices. Large volume of ascites in the abdomen and pelvis. 3. Slightly dilated gallbladder with gallbladder wall thickening or edema, nonspecific and could be due to liver disease. Correlation with ultrasound if clinically appropriate. 4. Mild ground-glass density in the right lower lobe, possible pneumonia Electronically Signed   By: Donavan Foil M.D.   On: 09/07/2021 21:06   DG Chest Port 1 View  Result Date: 09/08/2021  CLINICAL DATA:  Chest pain.  Chest EXAM: PORTABLE CHEST 1 VIEW COMPARISON:  Radiograph dated 08/19/2009. FINDINGS:  Shallow inspiration. No focal consolidation, pleural effusion, or pneumothorax. The cardiac silhouette is within normal limits. No acute osseous pathology. IMPRESSION: No active disease. Electronically Signed   By: Anner Crete M.D.   On: 09/08/2021 02:37   US ABDOMEN LIMITED RUQ (LIVER/GB)  Result Date: 09/08/2021 CLINICAL DATA:  Gallbladder thickening on recent CT. EXAM: ULTRASOUND ABDOMEN LIMITED RIGHT UPPER QUADRANT COMPARISON:  09/07/2021 FINDINGS: Gallbladder: No cholelithiasis. Positive sonographic Murphy sign. Gallbladder wall thickening measuring 6 mm. Pericholecystic fluid from the ascites. Common bile duct: Diameter: Not visualized Liver: Heterogeneous hepatic echotexture. Numerous masses within the liver with the largest in the right hepatic lobe measuring 4.8 x 4.2 cm and better evaluated on recent CT of the abdomen dated 09/07/2021. Echogenic material within the portal vein concerning for nonocclusive thrombus. Other: Moderate volume ascites IMPRESSION: 1. Gallbladder wall thickening without cholelithiasis. Differential considerations include acalculous cholecystitis, but this appearance can be seen in the setting of hepatocellular disease and ascites as is present in this patient. 2. Hepatocellular disease with multiple hepatic masses with the largest measuring 4.8 x 4.2 cm. Overall appearance is concerning for malignancy until proven otherwise. Recommend further evaluation with an MRI of the abdomen without and with intravenous contrast. 3. Echogenic material within the portal vein concerning for nonocclusive thrombus. Electronically Signed   By: Kathreen Devoid M.D.   On: 09/08/2021 07:11   IR Paracentesis  Result Date: 09/08/2021 INDICATION: Patient with history of progressive abdominal pain, distension, evidence of ascites. Request is made for diagnostic and therapeutic paracentesis. EXAM: ULTRASOUND GUIDED DIAGNOSTIC AND THERAPEUTIC PARACENTESIS MEDICATIONS: 10 mL 1% lidocaine  COMPLICATIONS: None immediate. PROCEDURE: Informed written consent was obtained from the patient after a discussion of the risks, benefits and alternatives to treatment. A timeout was performed prior to the initiation of the procedure. Initial ultrasound scanning demonstrates a large amount of ascites within the right lower abdominal quadrant. The right lower abdomen was prepped and draped in the usual sterile fashion. 1% lidocaine was used for local anesthesia. Following this, a 19 gauge, 7-cm, Yueh catheter was introduced. An ultrasound image was saved for documentation purposes. Despite ongoing presence of fluid by imaging, the catheter stopped working. Catheter was removed and found to be kinked. A new 19 gauge, 7 cm Yueh was replaced. The paracentesis was performed. The catheter was removed and a dressing was applied. The patient tolerated the procedure well without immediate post procedural complication. FINDINGS: A total of approximately 1.3 liters of blood-tinged fluid was removed. Samples were sent to the laboratory as requested by the clinical team. IMPRESSION: Successful ultrasound-guided diagnostic and therapeutic paracentesis yielding 1.3 liters of peritoneal fluid. Read by: Brynda Greathouse PA-C Electronically Signed   By: Ruthann Cancer M.D.   On: 09/08/2021 16:45    Microbiology: Results for orders placed or performed during the hospital encounter of 09/07/21  Resp Panel by RT-PCR (Flu A&B, Covid) Nasopharyngeal Swab     Status: None   Collection Time: 09/08/21  1:59 AM   Specimen: Nasopharyngeal Swab; Nasopharyngeal(NP) swabs in vial transport medium  Result Value Ref Range Status   SARS Coronavirus 2 by RT PCR NEGATIVE NEGATIVE Final    Comment: (NOTE) SARS-CoV-2 target nucleic acids are NOT DETECTED.  The SARS-CoV-2 RNA is generally detectable in upper respiratory specimens during the acute phase of infection. The lowest concentration of SARS-CoV-2 viral copies this assay can detect  is 138  copies/mL. A negative result does not preclude SARS-Cov-2 infection and should not be used as the sole basis for treatment or other patient management decisions. A negative result may occur with  improper specimen collection/handling, submission of specimen other than nasopharyngeal swab, presence of viral mutation(s) within the areas targeted by this assay, and inadequate number of viral copies(<138 copies/mL). A negative result must be combined with clinical observations, patient history, and epidemiological information. The expected result is Negative.  Fact Sheet for Patients:  EntrepreneurPulse.com.au  Fact Sheet for Healthcare Providers:  IncredibleEmployment.be  This test is no t yet approved or cleared by the [place] FDA and  has been authorized for detection and/or diagnosis of SARS-CoV-2 by FDA under an Emergency Use Authorization (EUA). This EUA will remain  in effect (meaning this test can be used) for the duration of the COVID-19 declaration under Section 564(b)(1) of the Act, 21 U.S.C.section 360bbb-3(b)(1), unless the authorization is terminated  or revoked sooner.       Influenza A by PCR NEGATIVE NEGATIVE Final   Influenza B by PCR NEGATIVE NEGATIVE Final    Comment: (NOTE) The Xpert Xpress SARS-CoV-2/FLU/RSV plus assay is intended as an aid in the diagnosis of influenza from Nasopharyngeal swab specimens and should not be used as a sole basis for treatment. Nasal washings and aspirates are unacceptable for Xpert Xpress SARS-CoV-2/FLU/RSV testing.  Fact Sheet for Patients: EntrepreneurPulse.com.au  Fact Sheet for Healthcare Providers: IncredibleEmployment.be  This test is not yet approved or cleared by the [place] FDA and has been authorized for detection and/or diagnosis of SARS-CoV-2 by FDA under an Emergency Use Authorization (EUA). This EUA will remain in effect  (meaning this test can be used) for the duration of the COVID-19 declaration under Section 564(b)(1) of the Act, 21 U.S.C. section 360bbb-3(b)(1), unless the authorization is terminated or revoked.  Performed at [facility], [place] [address] [place], [place] [address]   Culture, body fluid w Gram Stain-bottle     Status: None (Preliminary result)   Collection Time: 09/08/21  5:07 PM   Specimen: Fluid  Result Value Ref Range Status   Specimen Description FLUID PERITONEAL ABDOMEN  Final   Special Requests BOTTLES DRAWN AEROBIC AND ANAEROBIC 10CC  Final   Culture   Final    NO GROWTH < 24 HOURS Performed at [facility], [place] [address] [place], [place] [address]    Report Status PENDING  Incomplete  Gram stain     Status: None   Collection Time: 09/08/21  5:07 PM   Specimen: Fluid  Result Value Ref Range Status   Specimen Description FLUID PERITONEAL ABDOMEN  Final   Special Requests NONE  Final   Gram Stain   Final    FEW WBC PRESENT,BOTH PMN AND MONONUCLEAR NO ORGANISMS SEEN Performed at [facility], [address] [place], [place] [address]    Report Status 09/08/2021 FINAL  Final    Labs: CBC: Recent Labs  Lab 09/07/21 1730 09/08/21 0520 09/09/21 0336  WBC 5.8 4.9 5.0  NEUTROABS 3.0  --   --   HGB 14.0 11.9* 12.4*  HCT 38.8* 33.8* 34.9*  MCV 93.5 94.4 93.1  PLT 146* 135* 665   Basic Metabolic Panel: Recent Labs  Lab 09/07/21 1730 09/08/21 0520 09/09/21 0336 09/09/21 0921 09/10/21 0152  NA 134* 135 137 136 135  K 4.2 4.2 4.1 4.0 3.9  CL 103 105 106 105 106  CO2 25 24 23  23  24  GLUCOSE 84 112* 80 72 85  BUN 12 12 12 12 14   CREATININE 0.63 0.70 0.60* 0.69 0.64  CALCIUM 8.3* 7.8* 7.9* 7.8* 7.6*  MG  --  2.0  --   --   --    Liver Function Tests: Recent Labs  Lab 09/07/21 1730 09/08/21 0520 09/09/21 0336  AST 310* 248* 259*  ALT 148* 119* 122*  ALKPHOS 314* 228* 253*  BILITOT 4.6* 4.1* 3.8*  PROT 8.4* 6.8 7.3  ALBUMIN 2.1*  1.7* 1.7*   CBG: No results for input(s): GLUCAP in the last 168 hours.  Discharge time spent: greater than 30 minutes.  Signed: Estill Cotta, MD [facility] 09/10/2021

## 2021-09-10 NOTE — TOC Transition Note (Signed)
Transition of Care [facility]) - CM/SW Discharge Note   Patient Details  Name: Patrick Tran MRN: [id number] Date of Birth: 1971-05-05  Transition of Care [facility]) CM/SW Contact:  Bartholomew Crews, RN Phone Number: [phone] 09/10/2021, 3:17 PM   Clinical Narrative:     Patient to transition home today. Noted follow up appointments with oncology.   Final next level of care: Home/Self Care Barriers to Discharge: No Barriers Identified   Patient Goals and CMS Choice        Discharge Placement                       Discharge Plan and Services                                     Social Determinants of Health (SDOH) Interventions     Readmission Risk Interventions No flowsheet data found.

## 2021-09-11 ENCOUNTER — Telehealth: Payer: Self-pay

## 2021-09-11 LAB — CYTOLOGY - NON PAP

## 2021-09-11 NOTE — Telephone Encounter (Addendum)
-----   Message from [name], NP sent at 09/09/2021  3:01 PM EST ----- Patrick Tran,  Patient needs a hospital follow up me to manage diuretics. Can you get him an appt to follow up on cirrhosis.  He is from [place] and doesn't speak English. His language is Kinyarwanda.  Thanks

## 2021-09-11 NOTE — Telephone Encounter (Signed)
Called the patient using [employer], [place]. No answer. Interpreter left a message for me asking the patient have a return call made to [place]. I have held him an appointment 1/226/23 at 10:30 am as a new patient with Tye Savoy, NP-C.

## 2021-09-12 ENCOUNTER — Telehealth: Payer: Self-pay | Admitting: Hematology

## 2021-09-12 ENCOUNTER — Other Ambulatory Visit: Payer: Self-pay | Admitting: Hematology

## 2021-09-12 DIAGNOSIS — C220 Liver cell carcinoma: Secondary | ICD-10-CM

## 2021-09-12 NOTE — Progress Notes (Signed)
Opened for chart review

## 2021-09-12 NOTE — Telephone Encounter (Signed)
Contacted the patient and gave him the appointment date of 09/21/21. This was done using [employer].

## 2021-09-12 NOTE — Progress Notes (Signed)
START OFF PATHWAY REGIMEN - Other   [id number]:Atezolizumab 1,200 mg IV D1 + Bevacizumab 15 mg/kg IV D1 q21 Days:   A cycle is every 21 Days:     Atezolizumab      Bevacizumab-xxxx   **Always confirm dose/schedule in your pharmacy ordering system**  Patient Characteristics: Intent of Therapy: Non-Curative / Palliative Intent, Discussed with Patient 

## 2021-09-12 NOTE — Telephone Encounter (Signed)
Scheduled appt per 1/17 referral. Pt is aware of appt date and time. Pt is aware to arrive 15 mins prior to appt time.

## 2021-09-13 LAB — CULTURE, BODY FLUID W GRAM STAIN -BOTTLE: Culture: NO GROWTH

## 2021-09-13 LAB — LIPASE, FLUID: Lipase-Fluid: 76 U/L

## 2021-09-13 LAB — MISC LABCORP TEST (SEND OUT): Labcorp test code: 9985

## 2021-09-18 ENCOUNTER — Other Ambulatory Visit: Payer: Self-pay

## 2021-09-18 ENCOUNTER — Inpatient Hospital Stay: Payer: BC Managed Care – PPO | Attending: Nurse Practitioner | Admitting: Nurse Practitioner

## 2021-09-18 VITALS — BP 104/55 | HR 90 | Temp 98.2°F | Resp 16 | Wt 124.2 lb

## 2021-09-18 DIAGNOSIS — C220 Liver cell carcinoma: Secondary | ICD-10-CM | POA: Diagnosis present

## 2021-09-18 DIAGNOSIS — R140 Abdominal distension (gaseous): Secondary | ICD-10-CM | POA: Insufficient documentation

## 2021-09-18 NOTE — Progress Notes (Addendum)
[place]   [phone]) [phone] Fax:([phone]) [place] Note   Patient Care Team: Patient, No Pcp Per (Inactive) as PCP - General ([place]) Truitt Merle, MD as Consulting Physician (Hematology) Alla Feeling, NP as Nurse Practitioner (Nurse Practitioner) Jerene Bears, MD as Consulting Physician (Gastroenterology) Date of Service: 09/18/2021  CHIEF COMPLAINTS/PURPOSE OF CONSULTATION:  Hepatocellular carcinoma, referred by hospitalist   Oncology History  Hepatocellular carcinoma ([place])  09/07/2021 Imaging   CT CAP IMPRESSION: 1. Liver cirrhosis. Multiple hepatic masses including large at least 14.5 cm dominant right hepatic lobe mass concerning for hepatocellular carcinoma and or metastatic disease. There is portal vein thrombosis. 2. Borderline to mild splenomegaly. Suspicion of upper abdominal varices. Large volume of ascites in the abdomen and pelvis. 3. Slightly dilated gallbladder with gallbladder wall thickening or edema, nonspecific and could be due to liver disease. Correlation with ultrasound if clinically appropriate. 4. Mild ground-glass density in the right lower lobe, possible pneumonia   09/08/2021 Imaging   ABD US IMPRESSION: 1. Gallbladder wall thickening without cholelithiasis. Differential considerations include acalculous cholecystitis, but this appearance can be seen in the setting of hepatocellular disease and ascites as is present in this patient. 2. Hepatocellular disease with multiple hepatic masses with the largest measuring 4.8 x 4.2 cm. Overall appearance is concerning for malignancy until proven otherwise. Recommend further evaluation with an MRI of the abdomen without and with intravenous contrast. 3. Echogenic material within the portal vein concerning for nonocclusive thrombus.   09/08/2021 Imaging   MR abdomen with/without - IMPRESSION: 1. Moderate to markedly motion degraded exam. Per technologist notes, the  patient could not follow breath hold directions secondary to language barrier. 2. Findings most consistent with multifocal hepatocellular carcinoma in the setting of cirrhosis. Partial portal vein thrombosis, as delineated above. 3. Moderate volume ascites. 4. Right lower lobe pneumonia.   09/08/2021 Procedure   IR paracentesis IMPRESSION: Successful ultrasound-guided diagnostic and therapeutic paracentesis yielding 1.3 liters of peritoneal fluid.   09/08/2021 Pathology Results   Specimen Submitted:  A. ASCITES, PARACENTESIS:  FINAL MICROSCOPIC DIAGNOSIS:  - No malignant cells identified    09/09/2021 Tumor Marker   AFP: 122,267 ng/mL   09/10/2021 Initial Diagnosis   Hepatocellular carcinoma ([place])   09/20/2021 -  Chemotherapy   Patient is on Treatment Plan : LUNG Atezolizumab + Bevacizumab q21d Maintenance        HISTORY OF PRESENTING ILLNESS:  Patrick Tran 51 y.o. male with no significant past medical history is here because of newly diagnosed hepatocellular carcinoma. He presented to ED 09/07/21 with 1 month h/o abdominal pain and distention. Work up showed PLT 146, albumin 2.1, AST 310, ALT 148, ALK PHOS 314, and Tbili 4.6. Hepatitis panel showed reactive HCV antibody, confirmed HCV infection genotype 4. He denies any knowledge of how he contracted HCV, no h/o IV drug use, blood transfusion, or same sex partner. CT AP showed liver cirrhosis (new diagnosis) and multiple hepatic masses up to 14.5 x 8.6 cm in R hepatic lobe concerning for HCC. There was portal vein thrombosis, mild splenomegaly, and concern for upper abdominal varices. There was incidental finding of possible RLL pneumonia. He underwent Paracentesis for large volume ascites, cytology was negative for malignant cells. Follow up abdominal MRI w/wo contrast showed multiple bilateral liver masses including LR-5 lesions centered in segments 7 and 8 measuring 10.7 x8.9 x9.7 cm and within segment 2 measuring 1.7 cm, both of which  demonstrating arterial phase hyperenhancement and delayed  hypoenhancement. LR-4 lesion in segment 4A measuring 1.5 cm. The overall clinical picture is most c/w multifocal HCC. On 09/09/21 AFP markedly elevated to 122,267 ng/mL. Medical oncology was consulted during admission with plans for outpatient follow up. He was discharged home on 09/10/21. He is scheduled to see GI 09/21/21.   Socially, he is single and recently moved in with a friend. He has friends here but no blood relatives, no children.  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  His hospital discharge note excuses him from work through 09/25/2021.  He can complete ADLs independently and drive himself.  He denies alcohol, tobacco, or other drug use.  He left [place] in 1992, came to [place] in 2008. Denies known family history of cancer, but admittedly not in close contact with relatives.   Today, he presents by himself. He feels weak, mostly sedentary at home. Not currently working. He had postprandial pain that improved after paracentesis, he can eat better, but still not much. Fluid has not re-accumulated on lasix and aldactone. He is losing weight. Denies n/v. He has abdominal discomfort, taking tramadol which helps. Bowels moving. He has some skin lesions and itching. Denies fever, chills, cough, chest pain, dyspnea, recurrent leg swelling, bleeding.   MEDICAL HISTORY:  No past medical history on file.  SURGICAL HISTORY: Past Surgical History:  Procedure Laterality Date   IR PARACENTESIS  09/08/2021    SOCIAL HISTORY: Social History   Socioeconomic History   Marital status: Single    Spouse name: Not on file   Number of children: Not on file   Years of education: Not on file   Highest education level: Not on file  Occupational History   Not on file  Tobacco Use   Smoking status: Never   Smokeless tobacco: Not on file  Substance and Sexual Activity   Alcohol use: Yes     Comment: social    Drug use: No   Sexual activity: Yes  Other Topics Concern   Not on file  Social History Narrative   Not on file   Social Determinants of Health   Financial Resource Strain: Not on file  Food Insecurity: Not on file  Transportation Needs: Not on file  Physical Activity: Not on file  Stress: Not on file  Social Connections: Not on file  Intimate Partner Violence: Not on file    FAMILY HISTORY: Family History  Family history unknown: Yes    ALLERGIES:  has No Known Allergies.  MEDICATIONS:  Current Outpatient Medications  Medication Sig Dispense Refill   furosemide (LASIX) 40 MG tablet Take 1 tablet (40 mg total) by mouth daily. 30 tablet 3   spironolactone (ALDACTONE) 100 MG tablet Take 1 tablet (100 mg total) by mouth daily. 30 tablet 3   traMADol (ULTRAM) 50 MG tablet Take 1 tablet (50 mg total) by mouth every 6 (six) hours as needed for moderate pain or severe pain. 20 tablet 0   No current facility-administered medications for this visit.    REVIEW OF SYSTEMS:   Constitutional: Denies fevers, chills or abnormal night sweats (+) fatigue (+) weight loss Eyes: Denies blurriness of vision, double vision or watery eyes Ears, nose, mouth, throat, and face: Denies mucositis or sore throat Respiratory: Denies cough, dyspnea or wheezes Cardiovascular: Denies palpitation, chest discomfort (+) lower extremity swelling, improved on lasix, aldactone  Gastrointestinal:  Denies nausea, vomiting, constipation, diarrhea, bleeding, heartburn or change in bowel  habits (+) bloating (+) postprandial pain Skin: Denies abnormal skin rashes (+) itching Lymphatics: Denies new lymphadenopathy or easy bruising Neurological:Denies numbness, tingling or new weaknesses Behavioral/Psych: Mood is stable, no new changes  All other systems were reviewed with the patient and are negative.  PHYSICAL EXAMINATION: ECOG PERFORMANCE STATUS: 2 - Symptomatic, <50% confined to  bed  Vitals:   09/18/21 1100  BP: (!) 104/55  Pulse: 90  Resp: 16  Temp: 98.2 F (36.8 C)  SpO2: 100%   Filed Weights   09/18/21 1100  Weight: 124 lb 3 oz (56.3 kg)    GENERAL:alert, no distress and comfortable SKIN: few small scattered maculopapular skin eruptions to arms, trunk, upper back EYES: mild scleral icterus  NECK: without mass LUNGS: clear with normal breathing effort HEART: regular rate & rhythm, no murmurs, and no lower extremity edema ABDOMEN:abdomen firm and distended, c/w ascites, with epigastric tenderness and fullness over the RUQ. normal bowel sounds Musculoskeletal:no cyanosis of digits and no clubbing  PSYCH: alert & oriented x 3 with fluent speech NEURO: no focal motor/sensory deficits  LABORATORY DATA:  I have reviewed the data as listed CBC Latest Ref Rng & Units 09/09/2021 09/08/2021 09/07/2021  WBC 4.0 - 10.5 K/uL 5.0 4.9 5.8  Hemoglobin 13.0 - 17.0 g/dL 12.4(L) 11.9(L) 14.0  Hematocrit 39.0 - 52.0 % 34.9(L) 33.8(L) 38.8(L)  Platelets 150 - 400 K/uL 182 135(L) 146(L)   CMP Latest Ref Rng & Units 09/10/2021 09/09/2021 09/09/2021  Glucose 70 - 99 mg/dL 85 72 80  BUN 6 - 20 mg/dL 14 12 12   Creatinine 0.61 - 1.24 mg/dL 0.64 0.69 0.60(L)  Sodium 135 - 145 mmol/L 135 136 137  Potassium 3.5 - 5.1 mmol/L 3.9 4.0 4.1  Chloride 98 - 111 mmol/L 106 105 106  CO2 22 - 32 mmol/L 24 23 23   Calcium 8.9 - 10.3 mg/dL 7.6(L) 7.8(L) 7.9(L)  Total Protein 6.5 - 8.1 g/dL - - 7.3  Total Bilirubin 0.3 - 1.2 mg/dL - - 3.8(H)  Alkaline Phos 38 - 126 U/L - - 253(H)  AST 15 - 41 U/L - - 259(H)  ALT 0 - 44 U/L - - 122(H)     RADIOGRAPHIC STUDIES: I have personally reviewed the radiological images as listed and agreed with the findings in the report. MR ABDOMEN W WO CONTRAST  Result Date: 09/08/2021 CLINICAL DATA:  Ultrasound and CT demonstrating liver masses. Hepatocellular carcinoma suspected. Cytopenia. EXAM: MRI ABDOMEN WITHOUT AND WITH CONTRAST TECHNIQUE: Multiplanar  multisequence MR imaging of the abdomen was performed both before and after the administration of intravenous contrast. CONTRAST:  6.49m GADAVIST GADOBUTROL 1 MMOL/ML IV SOLN COMPARISON:  Ultrasound of earlier today's.  CT of 1 day prior. FINDINGS: Moderate to markedly motion degraded exam throughout. Lower chest: Right base pneumonia.  Normal heart size. Hepatobiliary: Moderate cirrhosis.  Multiple bilateral liver masses. A dominant mass, centered in segments 7 and 8, with minimal extension into the segment 4A, measures on the order of 10.7 x 8.9 by 9.7 cm on 17/3 and 16/2. This demonstrates heterogeneous arterial phase hyperenhancement on series 801, delayed hypoenhancement with an enhancing capsule including on 49/10804. This causes right portal vein thrombus with minimal extension in the main portal vein. More apparent on CT. Considered LR 5/TIV. Multiple other lesions with arterial phase hyperenhancement and delayed hypoenhancement are identified. -Examples include at 1.5 cm in segment 4A on 37/801. Central hypoenhancement including on image 44 delayed. Considered LR 4. -Within segment 2 at 1.7 cm on 40/801.  Demonstrates central hypoenhancement and enhancing capsule on delayed subtracted image 40/10803. LR 5. Gallbladder distension, without acute cholecystitis or biliary duct dilatation. Pancreas:  Normal, without mass or ductal dilatation. Spleen:  Normal in size, without focal abnormality. Adrenals/Urinary Tract: Normal adrenal glands. Normal kidneys, without hydronephrosis. Stomach/Bowel: Normal stomach and abdominal bowel loops. Vascular/Lymphatic: Aortic atherosclerosis. Right portal vein thrombus as detailed above. No abdominal adenopathy. Other:  Moderate volume ascites. Musculoskeletal: No acute osseous abnormality. IMPRESSION: 1. Moderate to markedly motion degraded exam. Per technologist notes, the patient could not follow breath hold directions secondary to language barrier. 2. Findings most  consistent with multifocal hepatocellular carcinoma in the setting of cirrhosis. Partial portal vein thrombosis, as delineated above. 3. Moderate volume ascites. 4. Right lower lobe pneumonia. Electronically Signed   By: Abigail Miyamoto M.D.   On: 09/08/2021 15:55   CT Abdomen Pelvis W Contrast  Result Date: 09/07/2021 CLINICAL DATA:  Bloating with abdominal pain EXAM: CT ABDOMEN AND PELVIS WITH CONTRAST TECHNIQUE: Multidetector CT imaging of the abdomen and pelvis was performed using the standard protocol following bolus administration of intravenous contrast. CONTRAST:  43m OMNIPAQUE IOHEXOL 350 MG/ML SOLN COMPARISON:  CT 08/19/2009 report FINDINGS: Lower chest: Lung bases demonstrate ground-glass density in the right lower lobe. No consolidation or effusion. Borderline cardiomegaly. Hepatobiliary: Nodular hepatic contour consistent with liver cirrhosis. Multiple heterogeneous liver masses with vague dominant mass in the right hepatic lobe measuring 14.5 by 8.6 cm. Distended gallbladder with probable gallbladder wall thickening. No calcified stone. No biliary dilatation. Pancreas: Unremarkable. No pancreatic ductal dilatation or surrounding inflammatory changes. Spleen: Borderline enlarged at 13.2 cm AP. Adrenals/Urinary Tract: Adrenal glands are within normal limits. Kidneys show no hydronephrosis. Central renal hyperdensity may reflect early excretion of contrast. The bladder is unremarkable Stomach/Bowel: The stomach is nonenlarged. No dilated small bowel. No acute bowel wall thickening. Negative appendix. Left colon diverticular disease. Vascular/Lymphatic: Mild aortic atherosclerosis. No aneurysm. Thrombus within the right and main portal veins. Splenic vein appears grossly patent. Recanalized paraumbilical vein. Small perigastric varices. Reproductive: Prostate is unremarkable. Other: No free air.  Large volume of ascites. Musculoskeletal: No acute osseous abnormality. IMPRESSION: 1. Liver cirrhosis.  Multiple hepatic masses including large at least 14.5 cm dominant right hepatic lobe mass concerning for hepatocellular carcinoma and or metastatic disease. There is portal vein thrombosis. 2. Borderline to mild splenomegaly. Suspicion of upper abdominal varices. Large volume of ascites in the abdomen and pelvis. 3. Slightly dilated gallbladder with gallbladder wall thickening or edema, nonspecific and could be due to liver disease. Correlation with ultrasound if clinically appropriate. 4. Mild ground-glass density in the right lower lobe, possible pneumonia Electronically Signed   By: KDonavan FoilM.D.   On: 09/07/2021 21:06   DG Chest Port 1 View  Result Date: 09/08/2021 CLINICAL DATA:  Chest pain.  Chest EXAM: PORTABLE CHEST 1 VIEW COMPARISON:  Radiograph dated 08/19/2009. FINDINGS: Shallow inspiration. No focal consolidation, pleural effusion, or pneumothorax. The cardiac silhouette is within normal limits. No acute osseous pathology. IMPRESSION: No active disease. Electronically Signed   By: AAnner CreteM.D.   On: 09/08/2021 02:37   [place]ABDOMEN LIMITED RUQ (LIVER/GB)  Result Date: 09/08/2021 CLINICAL DATA:  Gallbladder thickening on recent CT. EXAM: ULTRASOUND ABDOMEN LIMITED RIGHT UPPER QUADRANT COMPARISON:  09/07/2021 FINDINGS: Gallbladder: No cholelithiasis. Positive sonographic Murphy sign. Gallbladder wall thickening measuring 6 mm. Pericholecystic fluid from the ascites. Common bile duct: Diameter: Not visualized Liver: Heterogeneous hepatic echotexture. Numerous masses within the liver with the largest in  the right hepatic lobe measuring 4.8 x 4.2 cm and better evaluated on recent CT of the abdomen dated 09/07/2021. Echogenic material within the portal vein concerning for nonocclusive thrombus. Other: Moderate volume ascites IMPRESSION: 1. Gallbladder wall thickening without cholelithiasis. Differential considerations include acalculous cholecystitis, but this appearance can be seen in the  setting of hepatocellular disease and ascites as is present in this patient. 2. Hepatocellular disease with multiple hepatic masses with the largest measuring 4.8 x 4.2 cm. Overall appearance is concerning for malignancy until proven otherwise. Recommend further evaluation with an MRI of the abdomen without and with intravenous contrast. 3. Echogenic material within the portal vein concerning for nonocclusive thrombus. Electronically Signed   By: Kathreen Devoid M.D.   On: 09/08/2021 07:11   IR Paracentesis  Result Date: 09/08/2021 INDICATION: Patient with history of progressive abdominal pain, distension, evidence of ascites. Request is made for diagnostic and therapeutic paracentesis. EXAM: ULTRASOUND GUIDED DIAGNOSTIC AND THERAPEUTIC PARACENTESIS MEDICATIONS: 10 mL 1% lidocaine COMPLICATIONS: None immediate. PROCEDURE: Informed written consent was obtained from the patient after a discussion of the risks, benefits and alternatives to treatment. A timeout was performed prior to the initiation of the procedure. Initial ultrasound scanning demonstrates a large amount of ascites within the right lower abdominal quadrant. The right lower abdomen was prepped and draped in the usual sterile fashion. 1% lidocaine was used for local anesthesia. Following this, a 19 gauge, 7-cm, Yueh catheter was introduced. An ultrasound image was saved for documentation purposes. Despite ongoing presence of fluid by imaging, the catheter stopped working. Catheter was removed and found to be kinked. A new 19 gauge, 7 cm Yueh was replaced. The paracentesis was performed. The catheter was removed and a dressing was applied. The patient tolerated the procedure well without immediate post procedural complication. FINDINGS: A total of approximately 1.3 liters of blood-tinged fluid was removed. Samples were sent to the laboratory as requested by the clinical team. IMPRESSION: Successful ultrasound-guided diagnostic and therapeutic paracentesis  yielding 1.3 liters of peritoneal fluid. Read by: Brynda Greathouse PA-C Electronically Signed   By: Ruthann Cancer M.D.   On: 09/08/2021 16:45    ASSESSMENT & PLAN: 51 yo male with no significant PMH  Multifocal HCC -We reviewed his medical record in detail with the patient and interpreter.  -Pt Is a 51 yo male with untreated hepatitis C, liver cirrhosis, ascites. -Child-Pugh score is 11 (class C) -MRI 09/08/21 showed typical image findings c/w HCC including multiple bilateral liver masses and LR-5 lesions centered in segments 7 and 8 measuring 10.7 x8.9 x9.7 cm and within segment 2 measuring 1.7 cm, both of which demonstrating arterial phase hyperenhancement and delayed hypoenhancement. LR-4 lesion in segment 4A measuring 1.5 cm.  -AFP markedly elevated to 122,267 ng/mL, so his diagnosis of HCC is quite certain. We don't think he needs needle biopsy  to confirm the diagnosis.  -he is being referred for CT chest to complete staging.  -Given the large tumor and bilateral disease, he is not a surgical candidate, and he does not meet transplant criteria. Given that this is not resectable, we reviewed it is likely not curable at this point, but still treatable. -We discussed the options of liver targeted therapy for his [place]. However, due to his poor liver function and moderate to large ascites, this would be difficult. We will discuss his case in tumor board to see what options are available -We are recommending systemic therapy with immunotherapy atezolizumab (tecentriq) and biological therapy bevacizumab (avastin) on  day 1 q21 days.  --Chemo/immunotherapy consent: Side effects of atezolizumab including but not limited to fatigue, rash, thyroid dysfunction, cough/pneumonitis, diarrhea/colitis, or infusion reaction were discussed with patient in great detail. He agrees to proceed.  -Chemotherapy consent: Side effects of bevacizumab including but not limited to fatigue, nausea, vomiting, diarrhea, hair loss,  neuropathy, fluid retention, renal and kidney dysfunction, neutropenic fever, needed for blood transfusion, bleeding, were discussed with patient in great detail. She agrees to proceed.  -we will see him back in 2 weeks to review tumor board discussion and begin treatment.  -patient seen with Dr. Burr Medico  Abdominal distention/ascites, postprandial pain -Secondary to #1 and cirrhosis  -S/p paracentesis 09/09/21, 1.3 L removed. Cytology was negative for malignant cells -postprandial pain slightly improved after para. He can eat more now.  -Weight 131 in hospital, now 124 -he was referred to dietician   HCV cirrhosis, ? 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 This has not been treated  -we reviewed HCV and cirrhosis likely caused [place] -there is concern for varices on imaging, he will see GI on 1/26 and we have asked them to discuss upper endoscopy. We may hold bevacizumab until he has these banded -He has decompensated liver function, in 08/2021 labs show albumin INR 1.4, albumin 1.7, Tbili 3.8, moderate ascites, and no encephalopathy, child pugh class C - F/up Dr. Hilarie Fredrickson   Social  -He left [place] in 1992, moved to [place] in 2008. He lives with a friend, no children or blood relatives here.  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 He has been written out of work until 09/25/21. I wrote a letter extending his leave indefinitely due to his new diagnosis and pending treatment.  -He has been referred to SW for financial resources, emotional support, and possible transportation needs  Goals of care  -We reviewed that since his [facility] is not resectable, it is likely not curable, but still treatable.  -typically we use multimodal approach, but due to his liver dysfunction, he may not be a candidate for liver directed therapies.  -We recommend systemic treatment, he understands the goal is palliative,  to control disease, improve his cancer-related symptoms, and prolong is life.  -He was understandably emotional during our discussion   PLAN: -Work up reviewed -CT chest in 1-2 weeks to complete staging -Discuss case in tumor board -Referrals to nutritionist, SW -Chemo class prior to treatment -Lab, f/up, and begin tecentriq/beva in 2 weeks. May hold beva if he has not had EGD yet    Orders Placed This Encounter  Procedures   CT Chest Wo Contrast    Standing Status:   Future    Standing Expiration Date:   09/18/2022    Order Specific Question:   Preferred imaging location?    Answer:   [facility]   AFP tumor marker    Standing Status:   Standing    Number of Occurrences:   20    Standing Expiration Date:   09/18/2022   Ambulatory referral to Social Work    Referral Priority:   Routine    Referral Type:   Consultation    Referral Reason:   Specialty Services Required    Number of Visits Requested:   1   Ambulatory Referral to [facility] Nutrition    Referral Priority:   Routine    Referral Type:   Consultation    Referral Reason:  Specialty Services Required    Number of Visits Requested:   1    All questions were answered. The patient knows to call the clinic with any problems, questions or concerns.     Alla Feeling, NP 09/19/2021    Addendum  I have seen the patient, examined him. I agree with the assessment and and plan and have edited the notes.   51 yo male without significant past medical history, was recently hospitalized for abdominal pain and distention.  I have reviewed his images and her lab results.  He has newly diagnosed hepatitis C infection, which is not sure how he got it. He has decompensated liver cirrhosis with child Pugh class C. He unfortunately is probably not a candidate for surgical resection or liver targeted therapy such as Y90 due to his poor liver reserve. Will review his case in our GI conference. I discussed systemic therapy options  which is also very limited due to his poor liver function. He is not a candidate for TKIs, I recommend Tecentriq and Avastin as first line therapy, potential benefit and side effects discussed with him, he is interested. Will hold Avastin for first 1-2 cycles until he has EGD done. We also discuss social and nutritional support, all questions were answered.   Truitt Merle  09/18/2021

## 2021-09-19 ENCOUNTER — Encounter: Payer: Self-pay | Admitting: Hematology

## 2021-09-19 ENCOUNTER — Encounter: Payer: Self-pay | Admitting: Nurse Practitioner

## 2021-09-20 ENCOUNTER — Telehealth: Payer: Self-pay | Admitting: Hematology

## 2021-09-20 ENCOUNTER — Encounter: Payer: Self-pay | Admitting: *Deleted

## 2021-09-20 NOTE — Progress Notes (Signed)
[place] CSW Progress Notes  Social Work Intern contacted patient by phone per referral from Dr. Burr Medico. Intern spoke to patient with assistance from [employer].   Pt reports that his pain has decreased and that he feels well cared for by his medical team. Intern and patient discussed common feeling and emotions when being diagnosed with cancer, and the importance of support during treatment.    Pt denies need for transportation services at this time but will contact SW if need arises. Pt reports he is unable to work due to his illness and that he is concerned about his ability to pay his rent. SW Intern has made referral to [employer], and will meet with pt at his appointment on 09/29/21 to review additional financial resources with an interpreter. Intern also informed pt of food pantry program if he ever wants to bring home groceries.  Intern informed patient of the support team and support services at [facility], provided contact information and encouraged patient to call with any questions or concerns.  Rosary Lively, SW Intern Supervised by Gwinda Maine, LCSW

## 2021-09-20 NOTE — Progress Notes (Signed)
Appts made for chemo ed and Ct Chest.  I left vm with CT scan appt location, date, time and instructions.  I also left information regarding chemo education appt with location, date and time.  I let him know that I have arranged transportation for him and that either lyft or uber will pick him up at 1315. I left my call back number. This message was left via the pacific interpretor service interpretor # [phone].

## 2021-09-20 NOTE — Telephone Encounter (Signed)
Scheduled follow-up appointments per 1/23 los. Patient is aware.

## 2021-09-21 ENCOUNTER — Ambulatory Visit: Payer: BC Managed Care – PPO | Admitting: Nurse Practitioner

## 2021-09-25 ENCOUNTER — Telehealth: Payer: Self-pay

## 2021-09-25 NOTE — Telephone Encounter (Signed)
-----   Message from Truitt Merle, MD sent at 09/21/2021  9:27 PM EST ----- Thanks for keeping me in the loop, appreciate it. I completely agree with you, hope he can be seen soon.  I will include our navigator Santiago Glad here also. Santiago Glad, when you have a chance to talk to him, please let him contact GI also.   Thanks   Krista Blue   ----- Message ----- From: [name], NP Sent: 09/21/2021   6:03 PM EST To: Jerene Bears, MD, Greggory Keen, LPN, #  FYI,  Patient didn't show for GI appt today. He really needs to be seen for management of ascites/cirrhosis.  He needs an EGD for varices screening  Beth, I believe you have spoken with this gentleman before.  Would you please contact him through an interpreter or call the friend who is listed in epic (speaks [place]) and make another appointment for the patient.  Please ask him to bring his English speaking friend with him to the appointment if possible  Thanks, pg

## 2021-09-25 NOTE — Telephone Encounter (Signed)
Spoke with the patient using the interpreter. The patient will come to see [place] 09/27/21 at 8:30 am. I have asked he be here no later than 8:15 am. His friend is not able to come with him.

## 2021-09-25 NOTE — Progress Notes (Signed)
Pharmacist Chemotherapy Monitoring - Initial Assessment    Anticipated start date: 10/02/21   The following has been reviewed per standard work regarding the patient's treatment regimen: The patient's diagnosis, treatment plan and drug doses, and organ/hematologic function Lab orders and baseline tests specific to treatment regimen  The treatment plan start date, drug sequencing, and pre-medications Prior authorization status  Patient's documented medication list, including drug-drug interaction screen and prescriptions for anti-emetics and supportive care specific to the treatment regimen The drug concentrations, fluid compatibility, administration routes, and timing of the medications to be used The patient's access for treatment and lifetime cumulative dose history, if applicable  The patient's medication allergies and previous infusion related reactions, if applicable   Changes made to treatment plan:  treatment plan date  Follow up needed:  Pending authorization for treatment    Judge Stall, [facility], 09/25/2021  9:42 AM

## 2021-09-26 ENCOUNTER — Other Ambulatory Visit: Payer: Self-pay

## 2021-09-26 ENCOUNTER — Encounter: Payer: Self-pay | Admitting: Hematology

## 2021-09-26 ENCOUNTER — Encounter: Payer: Self-pay | Admitting: *Deleted

## 2021-09-26 ENCOUNTER — Inpatient Hospital Stay: Payer: BC Managed Care – PPO

## 2021-09-26 ENCOUNTER — Ambulatory Visit (HOSPITAL_COMMUNITY): Payer: BC Managed Care – PPO

## 2021-09-26 NOTE — Progress Notes (Signed)
Met with patient and interpreter in rear of lobby to introduce myself as Financial Resource Specialist and to offer available resources. ° °Discussed one-time $1000 [employer] grant and qualifications to assist with personal expenses while going through treatment. ° °Also, discussed insurance and employment status as patient states he is unsure about continuing employment which will affect his benefits. He has appointment with doctor today to discuss further. Patient may need copay assistance but may change to drug assistance if he becomes unemployed. Will hold off on copay application until plan in place.  ° °Advised interpreter I would need to see patient after class and appointment today briefly to complete grant paperwork. He brought income documentation with him today to proceed. ° °They have my card to know whom to ask for with my contact information. Patient proceeded to chemo ed class.  °

## 2021-09-26 NOTE — Progress Notes (Signed)
Correction to previous note. No MD visit seen on DAR for today but interpreter states he has MD visit today at 3.

## 2021-09-26 NOTE — Progress Notes (Signed)
Met with patient and interpreter at registration to complete grant process.  Patient approved for one-time $1000 [employer] grant to assist with personal expenses while going through treatment. Discussed in detail expenses and how they are covered. He received a gift card today.   He has copy of approval letter and expense sheet along with Outpatient pharmacy information and my card for any additional financial questions or concerns in a green folder.  The other needs he has referring to Education officer, museum.

## 2021-09-27 ENCOUNTER — Other Ambulatory Visit (INDEPENDENT_AMBULATORY_CARE_PROVIDER_SITE_OTHER): Payer: BC Managed Care – PPO

## 2021-09-27 ENCOUNTER — Encounter: Payer: Self-pay | Admitting: Internal Medicine

## 2021-09-27 ENCOUNTER — Other Ambulatory Visit: Payer: Self-pay

## 2021-09-27 ENCOUNTER — Ambulatory Visit (INDEPENDENT_AMBULATORY_CARE_PROVIDER_SITE_OTHER): Payer: BC Managed Care – PPO | Admitting: Internal Medicine

## 2021-09-27 VITALS — BP 100/48 | HR 71 | Ht 65.0 in | Wt 123.4 lb

## 2021-09-27 DIAGNOSIS — B182 Chronic viral hepatitis C: Secondary | ICD-10-CM | POA: Diagnosis not present

## 2021-09-27 DIAGNOSIS — C220 Liver cell carcinoma: Secondary | ICD-10-CM | POA: Diagnosis not present

## 2021-09-27 DIAGNOSIS — R188 Other ascites: Secondary | ICD-10-CM

## 2021-09-27 DIAGNOSIS — C229 Malignant neoplasm of liver, not specified as primary or secondary: Secondary | ICD-10-CM

## 2021-09-27 DIAGNOSIS — K7460 Unspecified cirrhosis of liver: Secondary | ICD-10-CM

## 2021-09-27 LAB — CBC WITH DIFFERENTIAL/PLATELET
Basophils Absolute: 0.1 10*3/uL (ref 0.0–0.1)
Basophils Relative: 1 % (ref 0.0–3.0)
Eosinophils Absolute: 0.1 10*3/uL (ref 0.0–0.7)
Eosinophils Relative: 1.7 % (ref 0.0–5.0)
HCT: 35 % — ABNORMAL LOW (ref 39.0–52.0)
Hemoglobin: 12.1 g/dL — ABNORMAL LOW (ref 13.0–17.0)
Lymphocytes Relative: 29.7 % (ref 12.0–46.0)
Lymphs Abs: 2 10*3/uL (ref 0.7–4.0)
MCHC: 34.7 g/dL (ref 30.0–36.0)
MCV: 96 fl (ref 78.0–100.0)
Monocytes Absolute: 0.8 10*3/uL (ref 0.1–1.0)
Monocytes Relative: 11.4 % (ref 3.0–12.0)
Neutro Abs: 3.9 10*3/uL (ref 1.4–7.7)
Neutrophils Relative %: 56.2 % (ref 43.0–77.0)
Platelets: 119 10*3/uL — ABNORMAL LOW (ref 150.0–400.0)
RBC: 3.64 Mil/uL — ABNORMAL LOW (ref 4.22–5.81)
RDW: 17.2 % — ABNORMAL HIGH (ref 11.5–15.5)
WBC: 6.9 10*3/uL (ref 4.0–10.5)

## 2021-09-27 LAB — PROTIME-INR
INR: 1.9 ratio — ABNORMAL HIGH (ref 0.8–1.0)
Prothrombin Time: 19.6 s — ABNORMAL HIGH (ref 9.6–13.1)

## 2021-09-27 LAB — COMPREHENSIVE METABOLIC PANEL
ALT: 147 U/L — ABNORMAL HIGH (ref 0–53)
AST: 294 U/L — ABNORMAL HIGH (ref 0–37)
Albumin: 2.3 g/dL — ABNORMAL LOW (ref 3.5–5.2)
Alkaline Phosphatase: 267 U/L — ABNORMAL HIGH (ref 39–117)
BUN: 13 mg/dL (ref 6–23)
CO2: 28 mEq/L (ref 19–32)
Calcium: 8.4 mg/dL (ref 8.4–10.5)
Chloride: 93 mEq/L — ABNORMAL LOW (ref 96–112)
Creatinine, Ser: 0.6 mg/dL (ref 0.40–1.50)
GFR: 114.28 mL/min (ref >60.00)
Glucose, Bld: 79 mg/dL (ref 70–99)
Potassium: 4.8 mEq/L (ref 3.5–5.1)
Sodium: 123 mEq/L — ABNORMAL LOW (ref 135–145)
Total Bilirubin: 6.8 mg/dL — ABNORMAL HIGH (ref 0.2–1.2)
Total Protein: 8.3 g/dL (ref 6.0–8.3)

## 2021-09-27 MED ORDER — ALBUMIN HUMAN 25 % IV SOLN: 12.5000 g | Freq: Once | INTRAVENOUS | Status: DC

## 2021-09-27 NOTE — Progress Notes (Deleted)
Orders for IR paracentesis. 50g of albumin per each liter over 5L ascitic fluid removed. Pt scheduled for 10/04/21 at [facility] @ 2:00pm.

## 2021-09-27 NOTE — Progress Notes (Signed)
The proposed treatment discussed in conference is for discussion purpose only and is not a binding recommendation.  The patients have not been physically examined, or presented with their treatment options.  Therefore, final treatment plans cannot be decided.  

## 2021-09-27 NOTE — Patient Instructions (Addendum)
[place] isoko yasabye ko wajya kurwego rwo hasi kubikorwa bya laboratoire [place] yo [place] uyumunsi. Garret Reddish "B" kuri lift. Laboratwari iherereye kumuryango wambere ibumoso mugihe usohotse muri lift.  Wateganijwe kuri endoskopi. [place] kurikiza amabwiriza yanditse wahawe mugusura uyu munsi. Niba ukoresha impemu (nubwo bikenewe gusa), [place] uzane nawe kumunsi wibikorwa byawe.  Wateganijwe [place] parasentez yo [place] kuri radiyo ya [facility] (igorofa ya 1 yibitaro) le 10/04/21 saa mbiri za mugitondo. [place] uhageze byibuze iminota 15 mbere yigihe cyawe Odem. Fonnie Jarvis guhindura Caralee Ates Landis Martins [address] iyo ari yo yose, nyamuneka hamagara ibiro byacu [place] [phone]   Magda Paganini na [facility].  Dr.Pyrtle    _____________________________________________________________  Your provider has requested that you go to the basement level for lab work before leaving today. Press "B" on the elevator. The lab is located at the first door on the left as you exit the elevator.  You have been scheduled for an endoscopy. Please follow written instructions given to you at your visit today. If you use inhalers (even only as needed), please bring them with you on the day of your procedure.  You have been scheduled for an abdominal paracentesis at [facility] radiology (1st floor of hospital) on 10/04/21 at 2:00pm. Please arrive at least 15 minutes prior to your appointment time for registration. Should you need to reschedule this appointment for any reason, please call our office at [phone]   Thank you for choosing me and [facility].  Dr.Pyrtle

## 2021-09-28 ENCOUNTER — Other Ambulatory Visit: Payer: Self-pay

## 2021-09-28 ENCOUNTER — Encounter: Payer: Self-pay | Admitting: Internal Medicine

## 2021-09-28 NOTE — H&P (View-Only) (Signed)
Subjective:    Patient ID: Patrick Tran, male    DOB: 1971-02-16, 51 y.o.   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  He is here alone today.  Communication occurred today using medical interpreter via the iPad.  He has established with medical oncology and they are considering chemotherapy with atezolizumab and bevacizumab.  They are asking that we perform upper endoscopy for variceal screening prior to this chemotherapy initiation.  Today he denies specific complaint but feels weak.  Very low energy.  He feels the abdominal fluid reaccumulating but certainly not as severe or as uncomfortable as it was prior to her last paracentesis.  He has continued furosemide 40 and spironolactone 100 mg daily.  He is using some tramadol for abdominal pain.  He denies fever.  Denies chest pain or shortness of breath today.  No report of blood in stool or melena.   Review of Systems As per HPI, otherwise negative  Current Medications, Allergies, Past Medical History, Past Surgical History, Family History and Social History were reviewed in [employer] record.    Objective:   Physical Exam BP (!) 100/48    Pulse 71    Ht 5\' 5"  (1.651 m)    Wt 123 lb 6.4 oz (56 kg)    SpO2 98%    BMI 20.53 kg/m  Gen: awake, alert, NAD, cachectic appearing HEENT: anicteric CV: RRR, no mrg Pulm: CTA b/l Abd: soft, nontender, mild to moderate fluid present, not tense, positive BS throughout Ext: no c/c/e Neuro: nonfocal  MELD-Na score: 29 at 09/27/2021  9:35 AM MELD score: 21 at 09/27/2021  9:35 AM Calculated from: Serum Creatinine: 0.60 mg/dL (Using min of 1 mg/dL) at 09/27/2021  9:35 AM Serum Sodium: 123 mEq/L (Using min of 125 mEq/L) at 09/27/2021  9:35 AM Total  Bilirubin: 6.8 mg/dL at 09/27/2021  9:35 AM INR(ratio): 1.9 ratio at 09/27/2021  9:35 AM Age: 9 years      Assessment & Plan:  51 year old male with a 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   1.  Multifocal HCC/hep C cirrhosis --he has established with medical oncology who will direct palliative chemotherapy.  He is not a candidate for surgical resection or liver transplantation.  He is aware that this process is not curable but felt treatable.  We discussed upper endoscopy and the possibility of variceal banding.  We reviewed the risk, benefits and alternatives to this procedure at length and he understands and wishes to proceed.  We are going to perform this in the outpatient hospital setting so that EVL can be performed if high risk varices.  We discussed that a Avastin does carry an increased risk of bleeding and so if varices are large we would likely treat prior to chemotherapy. --Plan for upper endoscopy in the outpatient hospital setting with Dr. Rush Landmark on Friday at 1 PM. --He understands that if varices are treated with band ligation that he would need repeat EGD every 4 to 6 weeks until varices have been eradicated. --He will follow-up with medical oncology  2.  Abdominal ascites --treated with furosemide 40 and Aldactone 100 mg daily.  We will need to follow-up electrolytes ordered today.   -- Repeat large-volume paracentesis for comfort with 50 g of IV albumin; to be ordered today; I expect he will need these intermittently. --CBC CMP today along with INR  3.  CRC screening --deferred given large burden of incurable hepatocellular carcinoma.  50 minutes total spent today including patient facing time, coordination of care, reviewing medical history/procedures/pertinent radiology studies, and documentation of the  encounter.

## 2021-09-28 NOTE — Progress Notes (Signed)
Subjective:    Patient ID: Patrick Tran, male    DOB: October 09, 1970, 51 y.o.   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  He is here alone today.  Communication occurred today using medical interpreter via the iPad.  He has established with medical oncology and they are considering chemotherapy with atezolizumab and bevacizumab.  They are asking that we perform upper endoscopy for variceal screening prior to this chemotherapy initiation.  Today he denies specific complaint but feels weak.  Very low energy.  He feels the abdominal fluid reaccumulating but certainly not as severe or as uncomfortable as it was prior to her last paracentesis.  He has continued furosemide 40 and spironolactone 100 mg daily.  He is using some tramadol for abdominal pain.  He denies fever.  Denies chest pain or shortness of breath today.  No report of blood in stool or melena.   Review of Systems As per HPI, otherwise negative  Current Medications, Allergies, Past Medical History, Past Surgical History, Family History and Social History were reviewed in [employer] record.    Objective:   Physical Exam BP (!) 100/48    Pulse 71    Ht 5\' 5"  (1.651 m)    Wt 123 lb 6.4 oz (56 kg)    SpO2 98%    BMI 20.53 kg/m  Gen: awake, alert, NAD, cachectic appearing HEENT: anicteric CV: RRR, no mrg Pulm: CTA b/l Abd: soft, nontender, mild to moderate fluid present, not tense, positive BS throughout Ext: no c/c/e Neuro: nonfocal  MELD-Na score: 29 at 09/27/2021  9:35 AM MELD score: 21 at 09/27/2021  9:35 AM Calculated from: Serum Creatinine: 0.60 mg/dL (Using min of 1 mg/dL) at 09/27/2021  9:35 AM Serum Sodium: 123 mEq/L (Using min of 125 mEq/L) at 09/27/2021  9:35 AM Total  Bilirubin: 6.8 mg/dL at 09/27/2021  9:35 AM INR(ratio): 1.9 ratio at 09/27/2021  9:35 AM Age: 6 years      Assessment & Plan:  51 year old male with a 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   1.  Multifocal HCC/hep C cirrhosis --he has established with medical oncology who will direct palliative chemotherapy.  He is not a candidate for surgical resection or liver transplantation.  He is aware that this process is not curable but felt treatable.  We discussed upper endoscopy and the possibility of variceal banding.  We reviewed the risk, benefits and alternatives to this procedure at length and he understands and wishes to proceed.  We are going to perform this in the outpatient hospital setting so that EVL can be performed if high risk varices.  We discussed that a Avastin does carry an increased risk of bleeding and so if varices are large we would likely treat prior to chemotherapy. --Plan for upper endoscopy in the outpatient hospital setting with Dr. Rush Landmark on Friday at 1 PM. --He understands that if varices are treated with band ligation that he would need repeat EGD every 4 to 6 weeks until varices have been eradicated. --He will follow-up with medical oncology  2.  Abdominal ascites --treated with furosemide 40 and Aldactone 100 mg daily.  We will need to follow-up electrolytes ordered today.   -- Repeat large-volume paracentesis for comfort with 50 g of IV albumin; to be ordered today; I expect he will need these intermittently. --CBC CMP today along with INR  3.  CRC screening --deferred given large burden of incurable hepatocellular carcinoma.  50 minutes total spent today including patient facing time, coordination of care, reviewing medical history/procedures/pertinent radiology studies, and documentation of the  encounter.

## 2021-09-28 NOTE — Progress Notes (Signed)
Sent PA request to the [employer] for Chest CT ordered by Cira Rue, NP on 09/18/2021.  The test was expected to be completed on 09/25/2021 and the appt was made but later canceled d/t no PA was achieved.  Sent new PA request today for Chest CT.  Hopefully PA will be given before pt starts chemotherapy.

## 2021-09-29 ENCOUNTER — Inpatient Hospital Stay: Payer: BC Managed Care – PPO

## 2021-09-29 ENCOUNTER — Ambulatory Visit (HOSPITAL_COMMUNITY): Payer: BC Managed Care – PPO | Admitting: Anesthesiology

## 2021-09-29 ENCOUNTER — Other Ambulatory Visit: Payer: Self-pay

## 2021-09-29 ENCOUNTER — Encounter (HOSPITAL_COMMUNITY): Payer: Self-pay | Admitting: Gastroenterology

## 2021-09-29 ENCOUNTER — Inpatient Hospital Stay: Payer: BC Managed Care – PPO | Attending: Nurse Practitioner | Admitting: Hematology

## 2021-09-29 ENCOUNTER — Inpatient Hospital Stay: Payer: BC Managed Care – PPO | Admitting: Hematology

## 2021-09-29 ENCOUNTER — Telehealth: Payer: Self-pay

## 2021-09-29 ENCOUNTER — Ambulatory Visit (HOSPITAL_COMMUNITY)
Admission: RE | Admit: 2021-09-29 | Discharge: 2021-09-29 | Disposition: A | Discharge status: Home / Self Care | Payer: BC Managed Care – PPO | Attending: Gastroenterology | Admitting: Gastroenterology

## 2021-09-29 ENCOUNTER — Encounter (HOSPITAL_COMMUNITY)
Admission: RE | Disposition: A | Discharge status: Home / Self Care | Payer: Self-pay | Source: Home / Self Care | Attending: Gastroenterology

## 2021-09-29 DIAGNOSIS — C220 Liver cell carcinoma: Secondary | ICD-10-CM | POA: Insufficient documentation

## 2021-09-29 DIAGNOSIS — R1013 Epigastric pain: Secondary | ICD-10-CM | POA: Insufficient documentation

## 2021-09-29 DIAGNOSIS — C229 Malignant neoplasm of liver, not specified as primary or secondary: Secondary | ICD-10-CM

## 2021-09-29 DIAGNOSIS — K2970 Gastritis, unspecified, without bleeding: Secondary | ICD-10-CM

## 2021-09-29 DIAGNOSIS — I8500 Esophageal varices without bleeding: Secondary | ICD-10-CM | POA: Insufficient documentation

## 2021-09-29 DIAGNOSIS — R109 Unspecified abdominal pain: Secondary | ICD-10-CM | POA: Insufficient documentation

## 2021-09-29 DIAGNOSIS — K2950 Unspecified chronic gastritis without bleeding: Secondary | ICD-10-CM | POA: Diagnosis not present

## 2021-09-29 DIAGNOSIS — K7460 Unspecified cirrhosis of liver: Secondary | ICD-10-CM | POA: Insufficient documentation

## 2021-09-29 DIAGNOSIS — R188 Other ascites: Secondary | ICD-10-CM | POA: Insufficient documentation

## 2021-09-29 DIAGNOSIS — Z79899 Other long term (current) drug therapy: Secondary | ICD-10-CM | POA: Diagnosis not present

## 2021-09-29 DIAGNOSIS — Z8616 Personal history of COVID-19: Secondary | ICD-10-CM | POA: Insufficient documentation

## 2021-09-29 DIAGNOSIS — R140 Abdominal distension (gaseous): Secondary | ICD-10-CM | POA: Insufficient documentation

## 2021-09-29 DIAGNOSIS — G893 Neoplasm related pain (acute) (chronic): Secondary | ICD-10-CM | POA: Insufficient documentation

## 2021-09-29 DIAGNOSIS — Z7189 Other specified counseling: Secondary | ICD-10-CM | POA: Insufficient documentation

## 2021-09-29 DIAGNOSIS — I81 Portal vein thrombosis: Secondary | ICD-10-CM | POA: Diagnosis not present

## 2021-09-29 DIAGNOSIS — I8510 Secondary esophageal varices without bleeding: Secondary | ICD-10-CM

## 2021-09-29 DIAGNOSIS — K3189 Other diseases of stomach and duodenum: Secondary | ICD-10-CM | POA: Insufficient documentation

## 2021-09-29 DIAGNOSIS — B9681 Helicobacter pylori [H. pylori] as the cause of diseases classified elsewhere: Secondary | ICD-10-CM | POA: Diagnosis not present

## 2021-09-29 DIAGNOSIS — C182 Malignant neoplasm of ascending colon: Secondary | ICD-10-CM | POA: Insufficient documentation

## 2021-09-29 DIAGNOSIS — K766 Portal hypertension: Secondary | ICD-10-CM

## 2021-09-29 DIAGNOSIS — B1920 Unspecified viral hepatitis C without hepatic coma: Secondary | ICD-10-CM | POA: Diagnosis not present

## 2021-09-29 DIAGNOSIS — Z5112 Encounter for antineoplastic immunotherapy: Secondary | ICD-10-CM | POA: Insufficient documentation

## 2021-09-29 DIAGNOSIS — R180 Malignant ascites: Secondary | ICD-10-CM | POA: Insufficient documentation

## 2021-09-29 HISTORY — PX: BIOPSY: SHX5522

## 2021-09-29 HISTORY — PX: ESOPHAGOGASTRODUODENOSCOPY (EGD) WITH PROPOFOL: SHX5813

## 2021-09-29 HISTORY — PX: ESOPHAGEAL BANDING: SHX5518

## 2021-09-29 LAB — CBC WITH DIFFERENTIAL (CANCER CENTER ONLY)
Abs Immature Granulocytes: 0.05 10*3/uL (ref 0.00–0.07)
Basophils Absolute: 0.1 10*3/uL (ref 0.0–0.1)
Basophils Relative: 1 %
Eosinophils Absolute: 0.1 10*3/uL (ref 0.0–0.5)
Eosinophils Relative: 1 %
HCT: 32.9 % — ABNORMAL LOW (ref 39.0–52.0)
Hemoglobin: 11.9 g/dL — ABNORMAL LOW (ref 13.0–17.0)
Immature Granulocytes: 1 %
Lymphocytes Relative: 24 %
Lymphs Abs: 1.4 10*3/uL (ref 0.7–4.0)
MCH: 33.2 pg (ref 26.0–34.0)
MCHC: 36.2 g/dL — ABNORMAL HIGH (ref 30.0–36.0)
MCV: 91.9 fL (ref 80.0–100.0)
Monocytes Absolute: 0.6 10*3/uL (ref 0.1–1.0)
Monocytes Relative: 10 %
Neutro Abs: 3.7 10*3/uL (ref 1.7–7.7)
Neutrophils Relative %: 63 %
Platelet Count: 113 10*3/uL — ABNORMAL LOW (ref 150–400)
RBC: 3.58 MIL/uL — ABNORMAL LOW (ref 4.22–5.81)
RDW: 17.1 % — ABNORMAL HIGH (ref 11.5–15.5)
WBC Count: 5.9 10*3/uL (ref 4.0–10.5)
nRBC: 0 % (ref 0.0–0.2)

## 2021-09-29 LAB — CMP (CANCER CENTER ONLY)
ALT: 139 U/L — ABNORMAL HIGH (ref 0–44)
AST: 295 U/L (ref 15–41)
Albumin: 2.1 g/dL — ABNORMAL LOW (ref 3.5–5.0)
Alkaline Phosphatase: 251 U/L — ABNORMAL HIGH (ref 38–126)
Anion gap: 6 (ref 5–15)
BUN: 13 mg/dL (ref 6–20)
CO2: 22 mmol/L (ref 22–32)
Calcium: 8.1 mg/dL — ABNORMAL LOW (ref 8.9–10.3)
Chloride: 99 mmol/L (ref 98–111)
Creatinine: 0.44 mg/dL — ABNORMAL LOW (ref 0.61–1.24)
GFR, Estimated: >60 mL/min (ref >60)
Glucose, Bld: 68 mg/dL — ABNORMAL LOW (ref 70–99)
Potassium: 4.7 mmol/L (ref 3.5–5.1)
Sodium: 127 mmol/L — ABNORMAL LOW (ref 135–145)
Total Bilirubin: 7.3 mg/dL (ref 0.3–1.2)
Total Protein: 7.8 g/dL (ref 6.5–8.1)

## 2021-09-29 LAB — TSH: TSH: 1.774 u[IU]/mL (ref 0.320–4.118)

## 2021-09-29 SURGERY — ESOPHAGOGASTRODUODENOSCOPY (EGD) WITH PROPOFOL
Anesthesia: Monitor Anesthesia Care

## 2021-09-29 MED ORDER — PROPOFOL 500 MG/50ML IV EMUL
INTRAVENOUS | Status: DC | PRN
  Administered 2021-09-29: 125 ug/kg/min via INTRAVENOUS

## 2021-09-29 MED ORDER — OMEPRAZOLE 40 MG PO CPDR
40.0000 mg | DELAYED_RELEASE_CAPSULE | Freq: Two times a day (BID) | ORAL | 6 refills | Status: DC
Start: 2021-09-29 — End: 2021-11-01

## 2021-09-29 MED ORDER — DEXAMETHASONE SODIUM PHOSPHATE 10 MG/ML IJ SOLN
INTRAMUSCULAR | Status: DC | PRN
Start: 2021-09-29 — End: 2021-09-29
  Administered 2021-09-29: 5 mg via INTRAVENOUS

## 2021-09-29 MED ORDER — ONDANSETRON HCL 4 MG PO TABS: 4.0000 mg | ORAL_TABLET | Freq: Three times a day (TID) | ORAL | 0 refills | Status: DC | PRN

## 2021-09-29 MED ORDER — PROPOFOL 10 MG/ML IV BOLUS
INTRAVENOUS | Status: DC | PRN
Start: 2021-09-29 — End: 2021-09-29
  Administered 2021-09-29 (×5): 20 mg via INTRAVENOUS

## 2021-09-29 MED ORDER — SODIUM CHLORIDE 0.9 % IV SOLN: INTRAVENOUS | Status: DC

## 2021-09-29 MED ORDER — ONDANSETRON HCL 4 MG/2ML IJ SOLN
INTRAMUSCULAR | Status: DC | PRN
  Administered 2021-09-29 (×2): 4 mg via INTRAVENOUS

## 2021-09-29 MED ORDER — LIDOCAINE 2% (20 MG/ML) 5 ML SYRINGE
INTRAMUSCULAR | Status: DC | PRN
  Administered 2021-09-29: 80 mg via INTRAVENOUS

## 2021-09-29 MED ORDER — LACTATED RINGERS IV SOLN
INTRAVENOUS | Status: AC | PRN
  Administered 2021-09-29: 1000 mL via INTRAVENOUS

## 2021-09-29 MED ORDER — PROPOFOL 500 MG/50ML IV EMUL
INTRAVENOUS | Status: AC
  Filled 2021-09-29: qty 50

## 2021-09-29 SURGICAL SUPPLY — 15 items

## 2021-09-29 NOTE — Transfer of Care (Signed)
Immediate Anesthesia Transfer of Care Note  Patient: Patrick Tran  Procedure(s) Performed: ESOPHAGOGASTRODUODENOSCOPY (EGD) WITH PROPOFOL BIOPSY ESOPHAGEAL BANDING  Patient Location: PACU and Endoscopy Unit  Anesthesia Type:MAC  Level of Consciousness: awake, alert  and patient cooperative  Airway & Oxygen Therapy: Patient Spontanous Breathing and Patient connected to face mask oxygen  Post-op Assessment: Report given to RN and Post -op Vital signs reviewed and stable  Post vital signs: Reviewed and stable  Last Vitals:  Vitals Value Taken Time  BP 155/101 09/29/21 1349  Temp 36.4 C 09/29/21 1349  Pulse 104 09/29/21 1350  Resp 16 09/29/21 1350  SpO2 100 % 09/29/21 1350  Vitals shown include unvalidated device data.  Last Pain:  Vitals:   09/29/21 1349  TempSrc: Temporal  PainSc: 0-No pain         Complications: No notable events documented.

## 2021-09-29 NOTE — Discharge Instructions (Signed)
YOU HAD AN ENDOSCOPIC PROCEDURE TODAY: Refer to the procedure report and other information in the discharge instructions given to you for any specific questions about what was found during the examination. If this information does not answer your questions, please call [facility] office at [phone] to clarify.   YOU SHOULD EXPECT: Some feelings of bloating in the abdomen. Passage of more gas than usual. Walking can help get rid of the air that was put into your GI tract during the procedure and reduce the bloating. If you had a lower endoscopy (such as a colonoscopy or flexible sigmoidoscopy) you may notice spotting of blood in your stool or on the toilet paper. Some abdominal soreness may be present for a day or two, also.  DIET: Your first meal following the procedure should be a light meal and then it is ok to progress to your normal diet. A half-sandwich or bowl of soup is an example of a good first meal. Heavy or fried foods are harder to digest and may make you feel nauseous or bloated. Drink plenty of fluids but you should avoid alcoholic beverages for 24 hours. If you had a esophageal dilation, please see attached instructions for diet.    ACTIVITY: Your care partner should take you home directly after the procedure. You should plan to take it easy, moving slowly for the rest of the day. You can resume normal activity the day after the procedure however YOU SHOULD NOT DRIVE, use power tools, machinery or perform tasks that involve climbing or major physical exertion for 24 hours (because of the sedation medicines used during the test).   SYMPTOMS TO REPORT IMMEDIATELY: A gastroenterologist can be reached at any hour. Please call [phone]  for any of the following symptoms:   Following upper endoscopy (EGD, EUS, ERCP, esophageal dilation) Vomiting of blood or coffee ground material  New, significant abdominal pain  New, significant chest pain or pain under the shoulder blades  Painful or  persistently difficult swallowing  New shortness of breath  Black, tarry-looking or red, bloody stools  FOLLOW UP:  If any biopsies were taken you will be contacted by phone or by letter within the next 1-3 weeks. Call [phone]  if you have not heard about the biopsies in 3 weeks.  Please also call with any specific questions about appointments or follow up tests.  

## 2021-09-29 NOTE — Anesthesia Procedure Notes (Signed)
Procedure Name: MAC Date/Time: 09/29/2021 1:19 PM Performed by: Lollie Sails, CRNA Pre-anesthesia Checklist: Patient identified, Emergency Drugs available, Suction available, Patient being monitored and Timeout performed Oxygen Delivery Method: Simple face mask Preoxygenation: POM used. Placement Confirmation: positive ETCO2

## 2021-09-29 NOTE — Telephone Encounter (Signed)
Spoke with Ailene Ravel, RN in Endoscopy clinic regarding having labs drawn while in endoscopy today.  Ailene Ravel stated she will draw the labs while pt is in endoscopy today.  Notified [facility] of change in pt's scheduled.  Per Dr. Burr Medico, she will see pt in clinic on Monday prior to infusion.

## 2021-09-29 NOTE — Anesthesia Postprocedure Evaluation (Signed)
Anesthesia Post Note  Patient: Patrick Tran  Procedure(s) Performed: ESOPHAGOGASTRODUODENOSCOPY (EGD) WITH PROPOFOL BIOPSY ESOPHAGEAL BANDING     Patient location during evaluation: PACU Anesthesia Type: MAC Level of consciousness: awake and alert Pain management: pain level controlled Vital Signs Assessment: post-procedure vital signs reviewed and stable Respiratory status: spontaneous breathing Cardiovascular status: stable Anesthetic complications: no   No notable events documented.  Last Vitals:  Vitals:   09/29/21 1410 09/29/21 1420  BP: 135/83   Pulse: 84 86  Resp: 20 18  Temp:    SpO2: 98% 97%    Last Pain:  Vitals:   09/29/21 1420  TempSrc:   PainSc: 0-No pain                 Nolon Nations

## 2021-09-29 NOTE — Op Note (Signed)
[facility] Patient Name: Patrick Tran Procedure Date: 09/29/2021 MRN: [id number] Attending MD: Justice Britain , MD Date of Birth: [birth year] CSN: [id number] Age: 51 Admit Type: Inpatient Procedure:                Upper GI endoscopy Indications:              Epigastric abdominal pain, Portal hypertension rule                            out esophageal varices, Exclusion of gastric varices Providers:                Justice Britain, MD, Jeanella Cara, RN,                            Tyna Jaksch Technician Referring MD:             Lajuan Lines. Pyrtle, MD, Truitt Merle Medicines:                Monitored Anesthesia Care, Ondansetron 4 mg IV Complications:            No immediate complications. Estimated Blood Loss:     Estimated blood loss was minimal. Procedure:                Pre-Anesthesia Assessment:                           - Prior to the procedure, a History and Physical                            was performed, and patient medications and                            allergies were reviewed. The patient's tolerance of                            previous anesthesia was also reviewed. The risks                            and benefits of the procedure and the sedation                            options and risks were discussed with the patient.                            All questions were answered, and informed consent                            was obtained. Prior Anticoagulants: The patient has                            taken no previous anticoagulant or antiplatelet                            agents. ASA Grade Assessment: III - A patient with  severe systemic disease. After reviewing the risks                            and benefits, the patient was deemed in                            satisfactory condition to undergo the procedure.                           After obtaining informed consent, the endoscope was                             passed under direct vision. Throughout the                            procedure, the patient's blood pressure, pulse, and                            oxygen saturations were monitored continuously. The                            [id number] ([id number]) Olympus therapeutic endoscope                            was introduced through the mouth, and advanced to                            the second part of duodenum. The upper GI endoscopy                            was accomplished without difficulty. The patient                            tolerated the procedure. Scope In: Scope Out: Findings:      No gross lesions were noted in the proximal esophagus and in the mid       esophagus.      Three columns of grade I (1) and grade III (2) varices with no stigmata       of recent bleeding were found in the distal esophagus,. No red wale       signs were present. Three bands were successfully placed with complete       eradication of the largest grade III varices, resulting in deflation of       varices. There was no bleeding at the end of the procedure.      The Z-line was irregular and was found 38 cm from the incisors.      Mild portal hypertensive gastropathy was found in the entire examined       stomach.      Scattered moderate inflammation characterized by congestion (edema),       erythema and friability was found in the entire examined stomach.       Biopsies were taken with a cold forceps for histology and Helicobacter       pylori testing.      There is no endoscopic evidence of varices in the entire examined  stomach.      No gross lesions were noted in the duodenal bulb, in the first portion       of the duodenum and in the second portion of the duodenum.      Diffus friability throughout the GI tract is noted with suction vs scope       passage. Impression:               - No gross lesions in esophagus proximally. Grade I                            and grade III  esophageal varices with no stigmata                            of recent bleeding distally; completely eradicated                            via EVL band ligation as noted above.                           - Z-line irregular, 38 cm from the incisors.                           - Portal hypertensive gastropathy.                           - Gastritis. Biopsied.                           - No gastric varices.                           - No gross lesions in the duodenal bulb, in the                            first portion of the duodenum and in the second                            portion of the duodenum. Moderate Sedation:      Not Applicable - Patient had care per Anesthesia. Recommendation:           - The patient will be observed post-procedure,                            until all discharge criteria are met.                           - Discharge patient to home.                           - Patient has a contact number available for                            emergencies. The signs and symptoms of potential  delayed complications were discussed with the                            patient. Return to normal activities tomorrow.                            Written discharge instructions were provided to the                            patient. - Please use Cepacol or Halls Lozenges +/-                            Chloraseptic spray for next 72-96 hours to aid in                            sore thoat should you experience this.                           - Full liquid diet for 24 hours and then advance to                            soft diet for 1-week and then back to normal diet                            thereafter.                           - PO Omeprazole 40 mg twice daily. Please pick this                            up today and start this evening.                           - Short course of Zofran 8 mg Q8H PRN to be                            available if needed  for any nausea symptoms.                           - Monitor for signs/symptoms of bleeding (coffee                            ground emesis, hematemesis - bloody vomitus, melena                            - dark stools).                           - If any of the above, please call the office but                            be prepared for potential need for Hospital/ED  evaluation.                           - Repeat upper endoscopy in 4 weeks for                            surveillance.                           - Defer timing of Arena treatment until felt safe                            from an Oncology standpoint.                           - The findings and recommendations were discussed                            with the patient. Procedure Code(s):        --- Professional ---                           [phone], Esophagogastroduodenoscopy, flexible,                            transoral; with band ligation of esophageal/gastric                            varices                           43239, Esophagogastroduodenoscopy, flexible,                            transoral; with biopsy, single or multiple Diagnosis Code(s):        --- Professional ---                           K76.6, Portal hypertension                           I85.10, Secondary esophageal varices without                            bleeding                           K22.8, Other specified diseases of esophagus                           K31.89, Other diseases of stomach and duodenum                           K29.70, Gastritis, unspecified, without bleeding                           R10.13, Epigastric pain CPT copyright 2019 American Medical Association. All rights reserved. The codes documented in this report are preliminary and upon  coder review may  be revised to meet current compliance requirements. Justice Britain, MD 09/29/2021 2:01:11 PM Number of Addenda: 0

## 2021-09-29 NOTE — Interval H&P Note (Signed)
History and Physical Interval Note:  09/29/2021 12:20 PM  Patrick Tran  has presented today for surgery, with the diagnosis of Liver Cancer , Abdomen pain.  The various methods of treatment have been discussed with the patient and family. After consideration of risks, benefits and other options for treatment, the patient has consented to  Procedure(s): ESOPHAGOGASTRODUODENOSCOPY (EGD) WITH PROPOFOL (N/A) as a surgical intervention.  The patient's history has been reviewed, patient examined, no change in status, stable for surgery.  I have reviewed the patient's chart and labs.  Questions were answered to the patient's satisfaction.     [employer]

## 2021-09-29 NOTE — Progress Notes (Signed)
..  Patient is receiving Replacement Medication. Medication: Tecentriq Huey Bienenstock) Manufacture: [employer] Approval Dates: Approved from 09/29/2021 until indefinitely. ID: [id number] Reason: Insurance Denial First DOS: 10/02/2021.  Marland KitchenJuan Quam, CPhT IV Drug Replacement Specialist [place] Phone: [phone]

## 2021-09-29 NOTE — Anesthesia Preprocedure Evaluation (Addendum)
Anesthesia Evaluation  Patient identified by MRN, date of birth, ID band Patient awake    Reviewed: Allergy & Precautions, NPO status , Patient's Chart, lab work & pertinent test results  Airway Mallampati: II  TM Distance: >3 FB Neck ROM: Full    Dental  (+) Dental Advisory Given, Teeth Intact   Pulmonary pneumonia,    Pulmonary exam normal breath sounds clear to auscultation       Cardiovascular negative cardio ROS Normal cardiovascular exam Rhythm:Regular Rate:Normal     Neuro/Psych negative neurological ROS     GI/Hepatic negative GI ROS, (+) Hepatitis -  Endo/Other  negative endocrine ROS  Renal/GU negative Renal ROS     Musculoskeletal negative musculoskeletal ROS (+)   Abdominal   Peds  Hematology  (+) Blood dyscrasia, anemia ,   Anesthesia Other Findings   Reproductive/Obstetrics                            Anesthesia Physical Anesthesia Plan  ASA: 3  Anesthesia Plan: MAC   Post-op Pain Management: Minimal or no pain anticipated   Induction: Intravenous  PONV Risk Score and Plan: 1 and Midazolam, TIVA, Propofol infusion and Treatment may vary due to age or medical condition  Airway Management Planned: Natural Airway  Additional Equipment:   Intra-op Plan:   Post-operative Plan:   Informed Consent: I have reviewed the patients History and Physical, chart, labs and discussed the procedure including the risks, benefits and alternatives for the proposed anesthesia with the patient or authorized representative who has indicated his/her understanding and acceptance.     Dental advisory given  Plan Discussed with: CRNA  Anesthesia Plan Comments:        Anesthesia Quick Evaluation

## 2021-09-30 LAB — T4: T4, Total: 8 ug/dL (ref 4.5–12.0)

## 2021-10-02 ENCOUNTER — Other Ambulatory Visit: Payer: Self-pay

## 2021-10-02 ENCOUNTER — Encounter (HOSPITAL_COMMUNITY): Payer: Self-pay | Admitting: Gastroenterology

## 2021-10-02 ENCOUNTER — Inpatient Hospital Stay: Payer: BC Managed Care – PPO

## 2021-10-02 ENCOUNTER — Other Ambulatory Visit: Payer: BC Managed Care – PPO

## 2021-10-02 ENCOUNTER — Inpatient Hospital Stay (HOSPITAL_BASED_OUTPATIENT_CLINIC_OR_DEPARTMENT_OTHER): Payer: BC Managed Care – PPO | Admitting: Hematology

## 2021-10-02 ENCOUNTER — Ambulatory Visit: Payer: BC Managed Care – PPO | Admitting: Hematology

## 2021-10-02 ENCOUNTER — Encounter: Payer: Self-pay | Admitting: *Deleted

## 2021-10-02 ENCOUNTER — Encounter: Payer: Self-pay | Admitting: Gastroenterology

## 2021-10-02 VITALS — BP 132/84 | HR 81 | Temp 98.1°F | Resp 16 | Wt 128.5 lb

## 2021-10-02 DIAGNOSIS — Z79899 Other long term (current) drug therapy: Secondary | ICD-10-CM | POA: Diagnosis not present

## 2021-10-02 DIAGNOSIS — R180 Malignant ascites: Secondary | ICD-10-CM | POA: Diagnosis not present

## 2021-10-02 DIAGNOSIS — C220 Liver cell carcinoma: Secondary | ICD-10-CM

## 2021-10-02 DIAGNOSIS — K7460 Unspecified cirrhosis of liver: Secondary | ICD-10-CM | POA: Diagnosis not present

## 2021-10-02 DIAGNOSIS — I8500 Esophageal varices without bleeding: Secondary | ICD-10-CM | POA: Diagnosis not present

## 2021-10-02 DIAGNOSIS — Z5112 Encounter for antineoplastic immunotherapy: Secondary | ICD-10-CM | POA: Diagnosis not present

## 2021-10-02 DIAGNOSIS — R140 Abdominal distension (gaseous): Secondary | ICD-10-CM | POA: Diagnosis not present

## 2021-10-02 DIAGNOSIS — R109 Unspecified abdominal pain: Secondary | ICD-10-CM | POA: Diagnosis not present

## 2021-10-02 LAB — SURGICAL PATHOLOGY

## 2021-10-02 LAB — AFP TUMOR MARKER

## 2021-10-02 MED ORDER — SODIUM CHLORIDE 0.9 % IV SOLN: Freq: Once | INTRAVENOUS | Status: AC

## 2021-10-02 MED ORDER — SODIUM CHLORIDE 0.9 % IV SOLN
1200.0000 mg | Freq: Once | INTRAVENOUS | Status: AC
  Administered 2021-10-02: 1200 mg via INTRAVENOUS
  Filled 2021-10-02: qty 20

## 2021-10-02 NOTE — Progress Notes (Signed)
[place] Social Work Progress Notes  Social Work Theatre manager met with Mr. Slagel today in infusion with Video Interpreter, per referral from financial advocates, to assist pt with [facility] authorization to disclose information. Intern confirmed that pt would like to apply for SSDI and SNAP through [facility], however pt could not recall all of his personal information required to complete forms. Intern will call pt by phone on 10/04/21 at 10am per patient request to complete form.  Pt expressed that he is feeling overwhelmed which has impacted his memory, but that he appreciates the assistance.  Rosary Lively, Social Work Intern Supervised by Gwinda Maine, LCSW

## 2021-10-02 NOTE — Progress Notes (Signed)
Per Carolann Littler in pharmacy, tecentriq to run over 1hr, not 30 min. Verified with 2nd RN at time of hanging.

## 2021-10-02 NOTE — Progress Notes (Signed)
Ok to treat with today's labs per Dr.Feng

## 2021-10-02 NOTE — Patient Instructions (Signed)
[facility] ONCOL[facility]  Discharge Instructions: Thank you for choosing [facility] to provide your oncology and hematology care.   If you have a lab appointment with the [facility], please go directly to the [facility] and check in at the registration area.   Wear comfortable clothing and clothing appropriate for easy access to any Portacath or PICC line.   We strive to give you quality time with your provider. You may need to reschedule your appointment if you arrive late (15 or more minutes).  Arriving late affects you and other patients whose appointments are after yours.  Also, if you miss three or more appointments without notifying the office, you may be dismissed from the clinic at the providers discretion.      For prescription refill requests, have your pharmacy contact our office and allow 72 hours for refills to be completed.    Today you received the following chemotherapy and/or immunotherapy agents tecentriq      To help prevent nausea and vomiting after your treatment, we encourage you to take your nausea medication as directed.  BELOW ARE SYMPTOMS THAT SHOULD BE REPORTED IMMEDIATELY: *FEVER GREATER THAN 100.4 F (38 C) OR HIGHER *CHILLS OR SWEATING *NAUSEA AND VOMITING THAT IS NOT CONTROLLED WITH YOUR NAUSEA MEDICATION *UNUSUAL SHORTNESS OF BREATH *UNUSUAL BRUISING OR BLEEDING *URINARY PROBLEMS (pain or burning when urinating, or frequent urination) *BOWEL PROBLEMS (unusual diarrhea, constipation, pain near the anus) TENDERNESS IN MOUTH AND THROAT WITH OR WITHOUT PRESENCE OF ULCERS (sore throat, sores in mouth, or a toothache) UNUSUAL RASH, SWELLING OR PAIN  UNUSUAL VAGINAL DISCHARGE OR ITCHING   Items with * indicate a potential emergency and should be followed up as soon as possible or go to the Emergency Department if any problems should occur.  Please show the CHEMOTHERAPY ALERT CARD or IMMUNOTHERAPY ALERT CARD at check-in to  the Emergency Department and triage nurse.  Should you have questions after your visit or need to cancel or reschedule your appointment, please contact [facility]  Dept: [phone]  and follow the prompts.  Office hours are 8:00 a.m. to 4:30 p.m. Monday - Friday. Please note that voicemails left after 4:00 p.m. may not be returned until the following business day.  We are closed weekends and major holidays. You have access to a nurse at all times for urgent questions. Please call the main number to the clinic Dept: [phone] and follow the prompts.   For any non-urgent questions, you may also contact your provider using MyChart. We now offer e-Visits for anyone 40 and older to request care online for non-urgent symptoms. For details visit [email or web address].   Also download the MyChart app! Go to the app store, search "MyChart", open the app, select [facility], and log in with your MyChart username and password.  Due to Covid, a mask is required upon entering the hospital/clinic. If you do not have a mask, one will be given to you upon arrival. For doctor visits, patients may have 1 support person aged 4 or older with them. For treatment visits, patients cannot have anyone with them due to current Covid guidelines and our immunocompromised population.   Atezolizumab injection What is this medication? ATEZOLIZUMAB (a te zoe LIZ ue mab) is a monoclonal antibody. It is used to treat bladder cancer (urothelial cancer), liver cancer, lung cancer, and melanoma. This medicine may be used for other purposes; ask your health care provider or pharmacist if you  have questions. COMMON BRAND NAME(S): Tecentriq What should I tell my care team before I take this medication? They need to know if you have any of these conditions: autoimmune diseases like Crohn's disease, ulcerative colitis, or lupus have had or planning to have an allogeneic stem cell transplant (uses  someone else's stem cells) history of organ transplant history of radiation to the chest nervous system problems like myasthenia gravis or Guillain-Barre syndrome an unusual or allergic reaction to atezolizumab, other medicines, foods, dyes, or preservatives pregnant or trying to get pregnant breast-feeding How should I use this medication? This medicine is for infusion into a vein. It is given by a health care professional in a hospital or clinic setting. A special MedGuide will be given to you before each treatment. Be sure to read this information carefully each time. Talk to your pediatrician regarding the use of this medicine in children. Special care may be needed. Overdosage: If you think you have taken too much of this medicine contact a poison control center or emergency room at once. NOTE: This medicine is only for you. Do not share this medicine with others. What if I miss a dose? It is important not to miss your dose. Call your doctor or health care professional if you are unable to keep an appointment. What may interact with this medication? Interactions have not been studied. This list may not describe all possible interactions. Give your health care provider a list of all the medicines, herbs, non-prescription drugs, or dietary supplements you use. Also tell them if you smoke, drink alcohol, or use illegal drugs. Some items may interact with your medicine. What should I watch for while using this medication? Your condition will be monitored carefully while you are receiving this medicine. You may need blood work done while you are taking this medicine. Do not become pregnant while taking this medicine or for at least 5 months after stopping it. Women should inform their doctor if they wish to become pregnant or think they might be pregnant. There is a potential for serious side effects to an unborn child. Talk to your health care professional or pharmacist for more information. Do  not breast-feed an infant while taking this medicine or for at least 5 months after the last dose. What side effects may I notice from receiving this medication? Side effects that you should report to your doctor or health care professional as soon as possible: allergic reactions like skin rash, itching or hives, swelling of the face, lips, or tongue black, tarry stools bloody or watery diarrhea breathing problems changes in vision chest pain or chest tightness chills facial flushing fever headache signs and symptoms of high blood sugar such as dizziness; dry mouth; dry skin; fruity breath; nausea; stomach pain; increased hunger or thirst; increased urination signs and symptoms of liver injury like dark yellow or brown urine; general ill feeling or flu-like symptoms; light-colored stools; loss of appetite; nausea; right upper belly pain; unusually weak or tired; yellowing of the eyes or skin stomach pain trouble passing urine or change in the amount of urine Side effects that usually do not require medical attention (report to your doctor or health care professional if they continue or are bothersome): bone pain cough diarrhea joint pain muscle pain muscle weakness swelling of arms or legs tiredness weight loss This list may not describe all possible side effects. Call your doctor for medical advice about side effects. You may report side effects to FDA at 1-800-FDA-1088. Where should  I keep my medication? This drug is given in a hospital or clinic and will not be stored at home. NOTE: This sheet is a summary. It may not cover all possible information. If you have questions about this medicine, talk to your doctor, pharmacist, or health care provider.  2022 Elsevier/Gold Standard (2021-05-02 00:00:00)

## 2021-10-02 NOTE — Progress Notes (Signed)
Nutrition Assessment:  51 year old male with multifocal hepatocellular carcinoma.  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  Varices banded.  Patient receiving tecentriq.  Patient receiving paracentesis.  Met with patient during infusion.  Interpreter services provided by [employer] [phone] via [employer].  Patient reports decreased appetite because of stomach.  Says that he is eating mostly vegetables and fruits (plantains), not much meat.  Noted soft diet for 1 weeks per GI after banding.  Patient eats eggs, rice, beans, yogurt, cheese, drinks milk and eats fish but not much processed fish due to sodium.  Has tried protein shake but not sure the name of it.     Medications: lasix, prilosec, zofran, spironolactone  Labs: reviewed  Anthropometrics:   Height: 65 inches Weight: 123 lb 7.3 oz on 2/3 131 lb 8 oz on 1/15 BMI: 20  6% weight loss in the last 3 weeks (fluid weight loss as well)   Estimated Energy Needs  Kcals: 1400-1680 Protein: 70-84 g Fluid: 1.4 L  NUTRITION DIAGNOSIS: Inadequate oral intake related to cancer recent hospitalization, ascites as evidenced by 6% weight loss in the last 3 weeks.   INTERVENTION:  Reviewed foods high in protein besides meat with patient. Samples of ensure complete given to patient to try. Says his bottles are smaller.   Encouraged 2 a day if able Contact number provided    MONITORING, EVALUATION, GOAL: weight trends, intake   NEXT VISIT: Monday, Feb 27 during infusion  Neyda Durango B. Zenia Resides, [place], [place] Registered Dietitian [phone] (mobile)

## 2021-10-02 NOTE — Progress Notes (Signed)
[place]   [phone] [phone]   Clinic Follow up Note   Patient Care Team: Patient, No Pcp Per (Inactive) as PCP - General ([place]) Truitt Merle, MD as Consulting Physician (Hematology) Alla Feeling, NP as Nurse Practitioner (Nurse Practitioner) Jerene Bears, MD as Consulting Physician (Gastroenterology)  Date of Service:  10/02/2021  CHIEF COMPLAINT: f/u of [facility]  CURRENT THERAPY:  Patrick Tran, starting 10/02/21  ASSESSMENT & PLAN:  Patrick Tran is a 51 y.o. male with   Multifocal HCC,cT3N0M0 stage IIIA  -recently diagnosed untreated hepatitis C, liver cirrhosis, ascites. Child-Pugh score is 11 (class C) -MRI 09/08/21 showed findings c/w HCC including multiple bilateral liver masses: two LR-5 lesions, one centered in segments 7 and 8 measuring 10.7 cm and one within segment 2 measuring 1.7 cm; one LR-4 lesion in segment 4A measuring 1.5 cm.  -AFP markedly elevated to 122,267 ng/mL, biopsy not felt to be necessary. -cytology from paracentesis on 09/08/21 was negative. -EGD on 09/29/21 under Dr. Rush Landmark showed no gross lesions, esophageal varices without bleeding, and gastritis. Pathology showed active gastritis with H.pylori infection, no malignancy. -he is scheduled to begin immunotherapy atezolizumab (tecentriq) q21 days today.  -His case was presented in our GI tumor board, he is not a candidate for liver targeted therapy or surgery, due to his decompensated liver function.  -He is not a candidate for TKI's.  His insurance denied Tecentriq and Beva due to his poor liver function.  We got Tecentriq replacement but not beva -I again reviewed that there is very limited treatment options, potential side effects from immunotherapy, and he agrees to proceed Tecentriq today. -staging chest CT scheduled for tomorrow, 10/03/21. -f/u late next week    Abdominal distention/ascites, postprandial pain -Secondary to #1 and  cirrhosis  -S/p paracentesis 09/09/21, 1.3 L removed. Cytology was negative for malignant cells. Repeat scheduled 10/04/21 -postprandial pain slightly improved after para. He can eat more now.  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 This has not been treated. We reviewed HCV and cirrhosis likely caused Patrick Tran -EGD on 09/29/21 showed esophageal varices, which were banded. -most recent T bili from 09/29/21 was up to 7.3. - F/up Dr. Hilarie Fredrickson    Social  -He left [place] in 1992, moved to [place] in 2008. He lives with a friend, no children or blood relatives here.  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 We previously wrote a letter extending his leave indefinitely due to his new diagnosis and pending treatment.  -He has been referred to SW for financial resources, emotional support, and possible transportation needs   Goals of care  -We reviewed that since his [facility] is not resectable, it is likely not curable, but still treatable.  -typically we use multimodal approach, but due to his liver dysfunction, he may not be a candidate for liver directed therapies.  -We recommend systemic treatment, he understands the goal is palliative, to control disease, improve his cancer-related symptoms, and prolong is life.     PLAN: -begin tecentriq today  -dietician will see in infusion -chest CT tomorrow, 2/7 -lab and f/u late next week for toxicity check -Lab, f/u and Tecentriq in 3 weeks    No problem-specific Assessment & Plan notes found for this encounter.   SUMMARY OF ONCOLOGIC HISTORY: Oncology History  Hepatocellular carcinoma ([place])  09/07/2021 Imaging   CT CAP IMPRESSION: 1. Liver cirrhosis. Multiple hepatic masses including large at least 14.5 cm dominant right hepatic lobe mass concerning for hepatocellular  carcinoma and or metastatic disease. There is portal vein thrombosis. 2. Borderline to mild splenomegaly. Suspicion of upper abdominal varices. Large volume of ascites in the abdomen and pelvis. 3. Slightly dilated gallbladder with gallbladder wall thickening or edema, nonspecific and could be due to liver disease. Correlation with ultrasound if clinically appropriate. 4. Mild ground-glass density in the right lower lobe, possible pneumonia   09/08/2021 Imaging   ABD US IMPRESSION: 1. Gallbladder wall thickening without cholelithiasis. Differential considerations include acalculous cholecystitis, but this appearance can be seen in the setting of hepatocellular disease and ascites as is present in this patient. 2. Hepatocellular disease with multiple hepatic masses with the largest measuring 4.8 x 4.2 cm. Overall appearance is concerning for malignancy until proven otherwise. Recommend further evaluation with an MRI of the abdomen without and with intravenous contrast. 3. Echogenic material within the portal vein concerning for nonocclusive thrombus.   09/08/2021 Imaging   MR abdomen with/without - IMPRESSION: 1. Moderate to markedly motion degraded exam. Per technologist notes, the patient could not follow breath hold directions secondary to language barrier. 2. Findings most consistent with multifocal hepatocellular carcinoma in the setting of cirrhosis. Partial portal vein thrombosis, as delineated above. 3. Moderate volume ascites. 4. Right lower lobe pneumonia.   09/08/2021 Procedure   IR paracentesis IMPRESSION: Successful ultrasound-guided diagnostic and therapeutic paracentesis yielding 1.3 liters of peritoneal fluid.   09/08/2021 Pathology Results   Specimen Submitted:  A. ASCITES, PARACENTESIS:  FINAL MICROSCOPIC DIAGNOSIS:  - No malignant cells identified    09/08/2021 Cancer Staging   Staging form: Liver, AJCC 8th Edition - Clinical stage from 09/08/2021: Stage IIIA (cT3, cN0, cM0)  - Signed by Truitt Merle, MD on 10/01/2021 Stage prefix: Initial diagnosis    09/09/2021 Tumor Marker   AFP: 122,267 ng/mL   09/10/2021 Initial Diagnosis   Hepatocellular carcinoma ([place])   10/02/2021 -  Chemotherapy   Patient is on Treatment Plan : LUNG Atezolizumab + Bevacizumab q21d Maintenance        INTERVAL HISTORY:  Patrick Tran is here for a follow up of [facility]. He was last seen by me on 09/18/21 in consultation with NP Lacie. He was late to his appointments today, so I saw him in the infusion area.  He is clinically stable, underwent EGD last Friday and started PPI.  He denies any new symptoms since last visit.  He still has moderate fatigue, but able to function, he drove himself to the appointment today.  He has some mild to moderate abdominal pain in the right upper quadrant, tolerable.  Abdominal bloating is stable.   All other systems were reviewed with the patient and are negative.  MEDICAL HISTORY:  Past Medical History:  Diagnosis Date   CAP (community acquired pneumonia)    Cirrhosis ([place])    Hepatitis C virus infection    Hepatocellular carcinoma ([place])     SURGICAL HISTORY: Past Surgical History:  Procedure Laterality Date   BIOPSY  09/29/2021   Procedure: BIOPSY;  Surgeon: Mansouraty, Telford Nab., MD;  Location: [facility];  Service: Gastroenterology;;   ESOPHAGEAL BANDING  09/29/2021   Procedure: ESOPHAGEAL BANDING;  Surgeon: Irving Copas., MD;  Location: [facility];  Service: Gastroenterology;;   ESOPHAGOGASTRODUODENOSCOPY (EGD) WITH PROPOFOL N/A 09/29/2021   Procedure: ESOPHAGOGASTRODUODENOSCOPY (EGD) WITH PROPOFOL;  Surgeon: Justice Britain  Brooke Bonito., MD;  Location: [facility];  Service: Gastroenterology;  Laterality: N/A;   IR PARACENTESIS  09/08/2021    I have reviewed the social history and family history with the patient and they are unchanged from previous note.  ALLERGIES:  has No Known Allergies.  MEDICATIONS:  Current Outpatient  Medications  Medication Sig Dispense Refill   furosemide (LASIX) 40 MG tablet Take 1 tablet (40 mg total) by mouth daily. 30 tablet 3   omeprazole (PRILOSEC) 40 MG capsule Take 1 capsule (40 mg total) by mouth 2 (two) times daily before a meal. 60 capsule 6   ondansetron (ZOFRAN) 4 MG tablet Take 1 tablet (4 mg total) by mouth every 8 (eight) hours as needed for nausea or vomiting. 20 tablet 0   spironolactone (ALDACTONE) 100 MG tablet Take 1 tablet (100 mg total) by mouth daily. 30 tablet 3   traMADol (ULTRAM) 50 MG tablet Take 1 tablet (50 mg total) by mouth every 6 (six) hours as needed for moderate pain or severe pain. 20 tablet 0   No current facility-administered medications for this visit.    PHYSICAL EXAMINATION: ECOG PERFORMANCE STATUS: 2 - Symptomatic, <50% confined to bed  There were no vitals filed for this visit. Wt Readings from Last 3 Encounters:  10/02/21 128 lb 8 oz (58.3 kg)  09/29/21 123 lb 7.3 oz (56 kg)  09/27/21 123 lb 6.4 oz (56 kg)     GENERAL:alert, no distress and comfortable SKIN: skin color normal, no rashes or significant lesions EYES: normal, Conjunctiva are pink and non-injected, sclera clear  NEURO: alert & oriented x 3 with fluent speech  LABORATORY DATA:  I have reviewed the data as listed CBC Latest Ref Rng & Units 09/29/2021 09/27/2021 09/09/2021  WBC 4.0 - 10.5 K/uL 5.9 6.9 5.0  Hemoglobin 13.0 - 17.0 g/dL 11.9(L) 12.1(L) 12.4(L)  Hematocrit 39.0 - 52.0 % 32.9(L) 35.0(L) 34.9(L)  Platelets 150 - 400 K/uL 113(L) 119.0(L) 182     CMP Latest Ref Rng & Units 09/29/2021 09/27/2021 09/10/2021  Glucose 70 - 99 mg/dL 68(L) 79 85  BUN 6 - 20 mg/dL 13 13 14   Creatinine 0.61 - 1.24 mg/dL 0.44(L) 0.60 0.64  Sodium 135 - 145 mmol/L 127(L) 123(L) 135  Potassium 3.5 - 5.1 mmol/L 4.7 4.8 3.9  Chloride 98 - 111 mmol/L 99 93(L) 106  CO2 22 - 32 mmol/L 22 28 24   Calcium 8.9 - 10.3 mg/dL 8.1(L) 8.4 7.6(L)  Total Protein 6.5 - 8.1 g/dL 7.8 8.3 -  Total Bilirubin  0.3 - 1.2 mg/dL 7.3(HH) 6.8(H) -  Alkaline Phos 38 - 126 U/L 251(H) 267(H) -  AST 15 - 41 U/L 295(HH) 294(H) -  ALT 0 - 44 U/L 139(H) 147(H) -      RADIOGRAPHIC STUDIES: I have personally reviewed the radiological images as listed and agreed with the findings in the report. No results found.    No orders of the defined types were placed in this encounter.  All questions were answered. The patient knows to call the clinic with any problems, questions or concerns. No barriers to learning was detected. The total time spent in the appointment was 30 minutes.     Truitt Merle, MD 10/02/2021   I, Wilburn Mylar, am acting as scribe for Truitt Merle, MD.   I have reviewed the above documentation for accuracy and completeness, and I agree with the above.

## 2021-10-03 ENCOUNTER — Other Ambulatory Visit: Payer: Self-pay

## 2021-10-03 ENCOUNTER — Ambulatory Visit (HOSPITAL_COMMUNITY): Payer: BC Managed Care – PPO

## 2021-10-03 MED ORDER — TALICIA 250-12.5-10 MG PO CPDR: 4.0000 | DELAYED_RELEASE_CAPSULE | Freq: Three times a day (TID) | ORAL | 0 refills | Status: DC

## 2021-10-04 ENCOUNTER — Other Ambulatory Visit: Payer: Self-pay

## 2021-10-04 ENCOUNTER — Encounter: Payer: Self-pay | Admitting: *Deleted

## 2021-10-04 ENCOUNTER — Telehealth: Payer: Self-pay | Admitting: Hematology

## 2021-10-04 ENCOUNTER — Telehealth: Payer: Self-pay

## 2021-10-04 ENCOUNTER — Ambulatory Visit (HOSPITAL_COMMUNITY)
Admission: RE | Admit: 2021-10-04 | Discharge: 2021-10-04 | Disposition: A | Discharge status: Home / Self Care | Payer: BC Managed Care – PPO | Source: Ambulatory Visit | Attending: Internal Medicine | Admitting: Internal Medicine

## 2021-10-04 DIAGNOSIS — C220 Liver cell carcinoma: Secondary | ICD-10-CM

## 2021-10-04 DIAGNOSIS — R188 Other ascites: Secondary | ICD-10-CM | POA: Diagnosis not present

## 2021-10-04 DIAGNOSIS — C229 Malignant neoplasm of liver, not specified as primary or secondary: Secondary | ICD-10-CM

## 2021-10-04 DIAGNOSIS — K7460 Unspecified cirrhosis of liver: Secondary | ICD-10-CM | POA: Insufficient documentation

## 2021-10-04 HISTORY — PX: IR PARACENTESIS: IMG2679

## 2021-10-04 MED ORDER — LIDOCAINE HCL 1 % IJ SOLN
INTRAMUSCULAR | Status: AC
  Filled 2021-10-04: qty 20

## 2021-10-04 NOTE — Telephone Encounter (Signed)
Scheduled follow-up appointments per 2/6 los. Patient is aware.

## 2021-10-04 NOTE — Progress Notes (Signed)
[place] Work Progress Notes  Social Work Intern contacted Patrick Tran by phone with [employer], as follow up requested by pt on 10/02/21. Pt and intern completed missing information for [facility] forms: disclosure of information, disability assistance referral, and food stamp referral. Forms have been faxed to [facility]. Intern is working to schedule an appointment with pt, [employer] rep and interpreter at [facility] next week. Pt knows to expect a call from intern once appointment is scheduled.  Rosary Lively, Social Work Intern Supervised by Gwinda Maine, LCSW

## 2021-10-04 NOTE — Telephone Encounter (Signed)
LM for patient that this nurse was calling to see how he was doing after his first treatment with Tecentriq on 10-02-21.  Instructed him to call Dr. Ernestina Penna office at [phone] if he has any questions or concerns

## 2021-10-04 NOTE — Telephone Encounter (Signed)
-----   Message from [place], RN sent at 10/02/2021  3:08 PM EST ----- Regarding: Burr Medico first time call back On 10/02/21 pt was first time tecentriq. Tolerated well, needs callback. Not an [place] speaker, thanks!

## 2021-10-04 NOTE — Procedures (Signed)
PROCEDURE SUMMARY:  Successful US guided paracentesis from right abdomen.  Yielded 2.4 of clear yellow fluid.  No immediate complications.  Pt tolerated well.   EBL < 2 mL  Theresa Duty, NP 10/04/2021 3:04 PM

## 2021-10-12 ENCOUNTER — Other Ambulatory Visit: Payer: Self-pay

## 2021-10-12 ENCOUNTER — Telehealth: Payer: Self-pay

## 2021-10-12 DIAGNOSIS — K7460 Unspecified cirrhosis of liver: Secondary | ICD-10-CM

## 2021-10-12 DIAGNOSIS — C220 Liver cell carcinoma: Secondary | ICD-10-CM

## 2021-10-12 DIAGNOSIS — C229 Malignant neoplasm of liver, not specified as primary or secondary: Secondary | ICD-10-CM

## 2021-10-12 DIAGNOSIS — I8500 Esophageal varices without bleeding: Secondary | ICD-10-CM

## 2021-10-12 NOTE — Telephone Encounter (Signed)
Pt scheduled at [facility] hospital for EGD with banding on 11/01/21 at 10:30 am with Dr. Bryan Lemma. Case id: [id number]. Pre visit telephone call scheduled for 10/17/21 at 2:30 pm. Attempted to call pt's friend Renee Pain to let him know appointment times. Left message for him to call back.

## 2021-10-13 ENCOUNTER — Other Ambulatory Visit: Payer: Self-pay

## 2021-10-13 ENCOUNTER — Other Ambulatory Visit: Payer: Self-pay | Admitting: Hematology & Oncology

## 2021-10-13 ENCOUNTER — Inpatient Hospital Stay (HOSPITAL_BASED_OUTPATIENT_CLINIC_OR_DEPARTMENT_OTHER): Payer: BC Managed Care – PPO | Admitting: Hematology

## 2021-10-13 ENCOUNTER — Inpatient Hospital Stay: Payer: BC Managed Care – PPO

## 2021-10-13 VITALS — BP 141/92 | HR 96 | Temp 97.8°F | Resp 18 | Wt 142.5 lb

## 2021-10-13 DIAGNOSIS — C220 Liver cell carcinoma: Secondary | ICD-10-CM

## 2021-10-13 DIAGNOSIS — Z5112 Encounter for antineoplastic immunotherapy: Secondary | ICD-10-CM | POA: Diagnosis not present

## 2021-10-13 LAB — CBC WITH DIFFERENTIAL (CANCER CENTER ONLY)
Abs Immature Granulocytes: 0.03 10*3/uL (ref 0.00–0.07)
Basophils Absolute: 0 10*3/uL (ref 0.0–0.1)
Basophils Relative: 1 %
Eosinophils Absolute: 0.1 10*3/uL (ref 0.0–0.5)
Eosinophils Relative: 1 %
HCT: 34.2 % — ABNORMAL LOW (ref 39.0–52.0)
Hemoglobin: 11.7 g/dL — ABNORMAL LOW (ref 13.0–17.0)
Immature Granulocytes: 0 %
Lymphocytes Relative: 25 %
Lymphs Abs: 1.7 10*3/uL (ref 0.7–4.0)
MCH: 33.5 pg (ref 26.0–34.0)
MCHC: 34.2 g/dL (ref 30.0–36.0)
MCV: 98 fL (ref 80.0–100.0)
Monocytes Absolute: 0.6 10*3/uL (ref 0.1–1.0)
Monocytes Relative: 9 %
Neutro Abs: 4.4 10*3/uL (ref 1.7–7.7)
Neutrophils Relative %: 64 %
Platelet Count: 112 10*3/uL — ABNORMAL LOW (ref 150–400)
RBC: 3.49 MIL/uL — ABNORMAL LOW (ref 4.22–5.81)
RDW: 18.4 % — ABNORMAL HIGH (ref 11.5–15.5)
WBC Count: 6.9 10*3/uL (ref 4.0–10.5)
nRBC: 0 % (ref 0.0–0.2)

## 2021-10-13 LAB — CMP (CANCER CENTER ONLY)
ALT: 136 U/L — ABNORMAL HIGH (ref 0–44)
AST: 262 U/L (ref 15–41)
Albumin: 2.1 g/dL — ABNORMAL LOW (ref 3.5–5.0)
Alkaline Phosphatase: 263 U/L — ABNORMAL HIGH (ref 38–126)
Anion gap: 3 — ABNORMAL LOW (ref 5–15)
BUN: 14 mg/dL (ref 6–20)
CO2: 25 mmol/L (ref 22–32)
Calcium: 8 mg/dL — ABNORMAL LOW (ref 8.9–10.3)
Chloride: 104 mmol/L (ref 98–111)
Creatinine: 0.58 mg/dL — ABNORMAL LOW (ref 0.61–1.24)
GFR, Estimated: >60 mL/min (ref >60)
Glucose, Bld: 100 mg/dL — ABNORMAL HIGH (ref 70–99)
Potassium: 4.4 mmol/L (ref 3.5–5.1)
Sodium: 132 mmol/L — ABNORMAL LOW (ref 135–145)
Total Bilirubin: 6 mg/dL (ref 0.3–1.2)
Total Protein: 8.2 g/dL — ABNORMAL HIGH (ref 6.5–8.1)

## 2021-10-13 LAB — TSH: TSH: 2.528 u[IU]/mL (ref 0.320–4.118)

## 2021-10-13 LAB — TOTAL PROTEIN, URINE DIPSTICK: Protein, ur: <30 mg/dL — AB

## 2021-10-13 MED ORDER — FUROSEMIDE 20 MG PO TABS: 20.0000 mg | ORAL_TABLET | Freq: Every day | ORAL | 0 refills | Status: DC

## 2021-10-13 MED ORDER — MORPHINE SULFATE 15 MG PO TABS
7.5000 mg | ORAL_TABLET | ORAL | 0 refills | Status: DC | PRN
Start: 2021-10-13 — End: 2021-10-21

## 2021-10-13 MED ORDER — SPIRONOLACTONE 50 MG PO TABS: 50.0000 mg | ORAL_TABLET | Freq: Every day | ORAL | 0 refills | Status: DC

## 2021-10-13 MED ORDER — MORPHINE SULFATE 15 MG PO TABS: 7.5000 mg | ORAL_TABLET | ORAL | 0 refills | Status: DC | PRN

## 2021-10-13 NOTE — Telephone Encounter (Signed)
Spoke with pt's friend [place]. Let him know about appointment times and that pt would need to be at [place] at 9 am. He said that he would be the one taking the pt to the procedure. Pt's friend verbalized understanding and stated the would let the patient know. Asked Renee Pain if he would be with pt at time of previsit and he said that he would not.

## 2021-10-13 NOTE — Progress Notes (Signed)
Patrick Tran   [phone] Fax:([phone]   Clinic Follow up Note   Patient Care Team: Patient, No Pcp Per (Inactive) as PCP - General ([place]) Truitt Merle, MD as Consulting Physician (Hematology) Alla Feeling, NP as Nurse Practitioner (Nurse Practitioner) Jerene Bears, MD as Consulting Physician (Gastroenterology)  Date of Service:  10/13/2021  CHIEF COMPLAINT: f/u of [facility]  CURRENT THERAPY:  Patrick Tran, starting 10/02/21  ASSESSMENT & PLAN:  Patrick Tran is a 51 y.o. male with   1. Multifocal HCC,cT3N0M0 stage IIIA  -recently diagnosed untreated hepatitis C, liver cirrhosis, ascites. Child-Pugh score is 11 (class C) -MRI 09/08/21 showed findings c/w HCC including multiple bilateral liver masses: two LR-5 lesions, one centered in segments 7 and 8 measuring 10.7 cm and one within segment 2 measuring 1.7 cm; one LR-4 lesion in segment 4A measuring 1.5 cm.  -AFP markedly elevated to 122,267 ng/mL, biopsy not felt to be necessary. -cytology from paracentesis on 09/08/21 was negative. -EGD on 09/29/21 under Dr. Rush Landmark showed no gross lesions, esophageal varices without bleeding, and gastritis. Pathology showed active gastritis with H.pylori infection, no malignancy. -he began immunotherapy atezolizumab (tecentriq) q21 days on 10/02/21, this was denied by his insurance and drug was replaced. We are not able to replace AVastin. He tolerated well, but he continues to have multiple symptoms from his cancer. -Labs reviewed, total bilirubin slightly improved.   -We will schedule an urgent paracentesis next Monday, continue weekly -He is already scheduled for treatment on 10/23/21.   2. Symptom Management: Abdominal distention/ascites, postprandial pain, difficulty eating -Secondary to #1 and cirrhosis  -S/p paracentesis 09/09/21, 1.3 L removed. Cytology was negative for malignant cells. Repeat 10/04/21, will schedule another for Monday,  2/20. -his abdominal fluids are building up again, which causes him difficulty eating, as the food cannot pass well because of the fluids.   3. 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 This has not been treated. We reviewed HCV and cirrhosis likely caused [place] -EGD on 09/29/21 showed esophageal varices, which were banded. Repeat scheduled 11/01/21 with Dr. Bryan Lemma. -T bili today is 6 (10/13/21). - F/up Dr. Hilarie Fredrickson    4. Social  -He left [place] in 1992, moved to [place] in 2008. He lives with a friend, no children or blood relatives here.  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 We previously wrote a letter extending his leave indefinitely due to his new diagnosis and pending treatment.  -He has been referred to SW for financial resources, emotional support, and possible transportation needs   5. Goals of care, DNR -We reviewed that since his [facility] is not resectable, it is likely not curable, but still treatable.  -typically we use multimodal approach, but due to his liver dysfunction, he may not be a candidate for liver directed therapies.  -We recommend systemic treatment, he understands the goal is palliative, to control disease, improve his cancer-related symptoms, and prolong is life.  -he agreed with DNR today (10/13/21)     PLAN: -paracentesis 2/20 at 1 pm -I called in lasix, morphine, and spironolactone -Lab, f/u and Tecentriq on 10/23/21 as scheduled -nutritionist will see in infusion   -EGD on 11/01/21 with Dr. Bryan Lemma    No problem-specific Assessment & Plan notes found for this encounter.   SUMMARY OF ONCOLOGIC HISTORY: Oncology History  Hepatocellular carcinoma ([place])  09/07/2021 Imaging   CT  CAP IMPRESSION: 1. Liver cirrhosis. Multiple hepatic masses including large at least 14.5 cm dominant right hepatic lobe mass concerning for hepatocellular carcinoma  and or metastatic disease. There is portal vein thrombosis. 2. Borderline to mild splenomegaly. Suspicion of upper abdominal varices. Large volume of ascites in the abdomen and pelvis. 3. Slightly dilated gallbladder with gallbladder wall thickening or edema, nonspecific and could be due to liver disease. Correlation with ultrasound if clinically appropriate. 4. Mild ground-glass density in the right lower lobe, possible pneumonia   09/08/2021 Imaging   ABD US IMPRESSION: 1. Gallbladder wall thickening without cholelithiasis. Differential considerations include acalculous cholecystitis, but this appearance can be seen in the setting of hepatocellular disease and ascites as is present in this patient. 2. Hepatocellular disease with multiple hepatic masses with the largest measuring 4.8 x 4.2 cm. Overall appearance is concerning for malignancy until proven otherwise. Recommend further evaluation with an MRI of the abdomen without and with intravenous contrast. 3. Echogenic material within the portal vein concerning for nonocclusive thrombus.   09/08/2021 Imaging   MR abdomen with/without - IMPRESSION: 1. Moderate to markedly motion degraded exam. Per technologist notes, the patient could not follow breath hold directions secondary to language barrier. 2. Findings most consistent with multifocal hepatocellular carcinoma in the setting of cirrhosis. Partial portal vein thrombosis, as delineated above. 3. Moderate volume ascites. 4. Right lower lobe pneumonia.   09/08/2021 Procedure   IR paracentesis IMPRESSION: Successful ultrasound-guided diagnostic and therapeutic paracentesis yielding 1.3 liters of peritoneal fluid.   09/08/2021 Pathology Results   Specimen Submitted:  A. ASCITES, PARACENTESIS:  FINAL MICROSCOPIC DIAGNOSIS:  - No malignant cells identified    09/08/2021 Cancer Staging   Staging form: Liver, AJCC 8th Edition - Clinical stage from 09/08/2021: Stage IIIA (cT3, cN0, cM0) - Signed  by Truitt Merle, MD on 10/01/2021 Stage prefix: Initial diagnosis    09/09/2021 Tumor Marker   AFP: 122,267 ng/mL   09/10/2021 Initial Diagnosis   Hepatocellular carcinoma ([place])   10/02/2021 -  Chemotherapy   Patient is on Treatment Plan : LUNG Atezolizumab + Bevacizumab q21d Maintenance        INTERVAL HISTORY:  Patrick Tran is here for a follow up of [place]. He was last seen by me on 10/02/21. He presents to the clinic accompanied by an interpreter. We had advised him to go to the ED several days ago. He states today he did not want to go as he knew he had the visit today.   All other systems were reviewed with the patient and are negative.  MEDICAL HISTORY:  Past Medical History:  Diagnosis Date   CAP (community acquired pneumonia)    Cirrhosis ([place])    Hepatitis C virus infection    Hepatocellular carcinoma ([place])     SURGICAL HISTORY: Past Surgical History:  Procedure Laterality Date   BIOPSY  09/29/2021   Procedure: BIOPSY;  Surgeon: Mansouraty, Telford Nab., MD;  Location: [facility];  Service: Gastroenterology;;   ESOPHAGEAL BANDING  09/29/2021   Procedure: ESOPHAGEAL BANDING;  Surgeon: Irving Copas., MD;  Location: [facility];  Service: Gastroenterology;;   ESOPHAGOGASTRODUODENOSCOPY (EGD) WITH PROPOFOL N/A 09/29/2021   Procedure: ESOPHAGOGASTRODUODENOSCOPY (EGD) WITH PROPOFOL;  Surgeon: Irving Copas., MD;  Location: [facility];  Service: Gastroenterology;  Laterality: N/A;   IR PARACENTESIS  09/08/2021   IR PARACENTESIS  10/04/2021    I have reviewed the social history and family history with the patient and they are unchanged from previous note.  ALLERGIES:  has No Known Allergies.  MEDICATIONS:  Current Outpatient Medications  Medication Sig Dispense Refill   Amoxicill-Rifabutin-Omeprazole (TALICIA) 081-44.8-18 MG CPDR Take 4 capsules by mouth every 8 (eight) hours. 168 capsule 0   furosemide (LASIX) 40 MG tablet Take 1 tablet (40 mg total) by  mouth daily. 30 tablet 3   omeprazole (PRILOSEC) 40 MG capsule Take 1 capsule (40 mg total) by mouth 2 (two) times daily before a meal. 60 capsule 6   ondansetron (ZOFRAN) 4 MG tablet Take 1 tablet (4 mg total) by mouth every 8 (eight) hours as needed for nausea or vomiting. 20 tablet 0   spironolactone (ALDACTONE) 100 MG tablet Take 1 tablet (100 mg total) by mouth daily. 30 tablet 3   traMADol (ULTRAM) 50 MG tablet Take 1 tablet (50 mg total) by mouth every 6 (six) hours as needed for moderate pain or severe pain. 20 tablet 0   No current facility-administered medications for this visit.    PHYSICAL EXAMINATION: ECOG PERFORMANCE STATUS: 3 - Symptomatic, >50% confined to bed  Vitals:   10/13/21 1244  BP: (!) 141/92  Pulse: 96  Resp: 18  Temp: 97.8 F (36.6 C)  SpO2: 99%   Wt Readings from Last 3 Encounters:  10/13/21 142 lb 8 oz (64.6 kg)  10/02/21 128 lb 8 oz (58.3 kg)  09/29/21 123 lb 7.3 oz (56 kg)     GENERAL:alert, no distress and comfortable SKIN: skin color, texture, turgor are normal, no rashes or significant lesions EYES: normal, Conjunctiva are pink and non-injected, sclera clear  HEART: (+) b/l lower extremity edema ABDOMEN: (+) severely distended  NEURO: alert & oriented x 3 with fluent speech, no focal motor/sensory deficits  LABORATORY DATA:  I have reviewed the data as listed CBC Latest Ref Rng & Units 10/13/2021 09/29/2021 09/27/2021  WBC 4.0 - 10.5 K/uL 6.9 5.9 6.9  Hemoglobin 13.0 - 17.0 g/dL 11.7(L) 11.9(L) 12.1(L)  Hematocrit 39.0 - 52.0 % 34.2(L) 32.9(L) 35.0(L)  Platelets 150 - 400 K/uL 112(L) 113(L) 119.0(L)     CMP Latest Ref Rng & Units 10/13/2021 09/29/2021 09/27/2021  Glucose 70 - 99 mg/dL 100(H) 68(L) 79  BUN 6 - 20 mg/dL 14 13 13   Creatinine 0.61 - 1.24 mg/dL 0.58(L) 0.44(L) 0.60  Sodium 135 - 145 mmol/L 132(L) 127(L) 123(L)  Potassium 3.5 - 5.1 mmol/L 4.4 4.7 4.8  Chloride 98 - 111 mmol/L 104 99 93(L)  CO2 22 - 32 mmol/L 25 22 28   Calcium 8.9 -  10.3 mg/dL 8.0(L) 8.1(L) 8.4  Total Protein 6.5 - 8.1 g/dL 8.2(H) 7.8 8.3  Total Bilirubin 0.3 - 1.2 mg/dL 6.0(HH) 7.3(HH) 6.8(H)  Alkaline Phos 38 - 126 U/L 263(H) 251(H) 267(H)  AST 15 - 41 U/L 262(HH) 295(HH) 294(H)  ALT 0 - 44 U/L 136(H) 139(H) 147(H)      RADIOGRAPHIC STUDIES: I have personally reviewed the radiological images as listed and agreed with the findings in the report. No results found.    Orders Placed This Encounter  Procedures   US Paracentesis    Standing Status:   Standing    Number of Occurrences:   20    Standing Expiration Date:   10/13/2022    Order Specific Question:   If therapeutic, is there a maximum amount of fluid to be removed?    Answer:   Yes    Order Specific Question:   What is the maximum amount of fluide to be removed?    Answer:   5L  Order Specific Question:   Are labs required for specimen collection?    Answer:   No    Order Specific Question:   Is Albumin medication needed?    Answer:   No    Order Specific Question:   Reason for Exam (SYMPTOM  OR DIAGNOSIS REQUIRED)    Answer:   symptom relieve    Order Specific Question:   Preferred imaging location?    Answer:   [facility]   All questions were answered. The patient knows to call the clinic with any problems, questions or concerns. No barriers to learning was detected. The total time spent in the appointment was 30 minutes.     Truitt Merle, MD 10/13/2021   I, Wilburn Mylar, am acting as scribe for Truitt Merle, MD.   I have reviewed the above documentation for accuracy and completeness, and I agree with the above.

## 2021-10-14 ENCOUNTER — Encounter: Payer: Self-pay | Admitting: Hematology

## 2021-10-14 LAB — T4: T4, Total: 9.1 ug/dL (ref 4.5–12.0)

## 2021-10-16 ENCOUNTER — Emergency Department (HOSPITAL_COMMUNITY): Payer: BC Managed Care – PPO

## 2021-10-16 ENCOUNTER — Encounter (HOSPITAL_COMMUNITY): Payer: Self-pay | Admitting: Emergency Medicine

## 2021-10-16 ENCOUNTER — Other Ambulatory Visit: Payer: Self-pay

## 2021-10-16 ENCOUNTER — Encounter (HOSPITAL_COMMUNITY): Payer: Self-pay

## 2021-10-16 ENCOUNTER — Inpatient Hospital Stay (HOSPITAL_COMMUNITY)
Admission: EM | Admit: 2021-10-16 | Discharge: 2021-10-21 | DRG: 441 | Disposition: A | Discharge status: Home / Self Care | Payer: BC Managed Care – PPO | Attending: Family Medicine | Admitting: Family Medicine

## 2021-10-16 ENCOUNTER — Telehealth: Payer: Self-pay | Admitting: *Deleted

## 2021-10-16 DIAGNOSIS — G9341 Metabolic encephalopathy: Secondary | ICD-10-CM

## 2021-10-16 DIAGNOSIS — R4182 Altered mental status, unspecified: Secondary | ICD-10-CM | POA: Diagnosis not present

## 2021-10-16 DIAGNOSIS — T402X5A Adverse effect of other opioids, initial encounter: Secondary | ICD-10-CM | POA: Diagnosis present

## 2021-10-16 DIAGNOSIS — Z79899 Other long term (current) drug therapy: Secondary | ICD-10-CM

## 2021-10-16 DIAGNOSIS — K7682 Hepatic encephalopathy: Secondary | ICD-10-CM | POA: Insufficient documentation

## 2021-10-16 DIAGNOSIS — F1190 Opioid use, unspecified, uncomplicated: Secondary | ICD-10-CM | POA: Insufficient documentation

## 2021-10-16 DIAGNOSIS — E875 Hyperkalemia: Secondary | ICD-10-CM

## 2021-10-16 DIAGNOSIS — B182 Chronic viral hepatitis C: Secondary | ICD-10-CM

## 2021-10-16 DIAGNOSIS — K7469 Other cirrhosis of liver: Secondary | ICD-10-CM | POA: Diagnosis present

## 2021-10-16 DIAGNOSIS — G928 Other toxic encephalopathy: Secondary | ICD-10-CM | POA: Diagnosis present

## 2021-10-16 DIAGNOSIS — T40602A Poisoning by unspecified narcotics, intentional self-harm, initial encounter: Secondary | ICD-10-CM

## 2021-10-16 DIAGNOSIS — T40601A Poisoning by unspecified narcotics, accidental (unintentional), initial encounter: Secondary | ICD-10-CM

## 2021-10-16 DIAGNOSIS — I8510 Secondary esophageal varices without bleeding: Secondary | ICD-10-CM | POA: Diagnosis present

## 2021-10-16 DIAGNOSIS — Z66 Do not resuscitate: Secondary | ICD-10-CM

## 2021-10-16 DIAGNOSIS — E8779 Other fluid overload: Secondary | ICD-10-CM | POA: Diagnosis present

## 2021-10-16 DIAGNOSIS — E8770 Fluid overload, unspecified: Secondary | ICD-10-CM

## 2021-10-16 DIAGNOSIS — E871 Hypo-osmolality and hyponatremia: Secondary | ICD-10-CM

## 2021-10-16 DIAGNOSIS — C220 Liver cell carcinoma: Secondary | ICD-10-CM | POA: Diagnosis present

## 2021-10-16 DIAGNOSIS — U071 COVID-19: Secondary | ICD-10-CM

## 2021-10-16 DIAGNOSIS — K7460 Unspecified cirrhosis of liver: Secondary | ICD-10-CM

## 2021-10-16 DIAGNOSIS — R188 Other ascites: Secondary | ICD-10-CM | POA: Diagnosis present

## 2021-10-16 DIAGNOSIS — Z7189 Other specified counseling: Secondary | ICD-10-CM

## 2021-10-16 DIAGNOSIS — Z515 Encounter for palliative care: Secondary | ICD-10-CM

## 2021-10-16 DIAGNOSIS — J9811 Atelectasis: Secondary | ICD-10-CM | POA: Diagnosis present

## 2021-10-16 DIAGNOSIS — E162 Hypoglycemia, unspecified: Secondary | ICD-10-CM | POA: Diagnosis not present

## 2021-10-16 LAB — CBC WITH DIFFERENTIAL/PLATELET
Abs Immature Granulocytes: 0.24 10*3/uL — ABNORMAL HIGH (ref 0.00–0.07)
Basophils Absolute: 0 10*3/uL (ref 0.0–0.1)
Basophils Relative: 0 %
Eosinophils Absolute: 0.1 10*3/uL (ref 0.0–0.5)
Eosinophils Relative: 1 %
HCT: 33.8 % — ABNORMAL LOW (ref 39.0–52.0)
Hemoglobin: 11.4 g/dL — ABNORMAL LOW (ref 13.0–17.0)
Immature Granulocytes: 3 %
Lymphocytes Relative: 22 %
Lymphs Abs: 1.9 10*3/uL (ref 0.7–4.0)
MCH: 34.1 pg — ABNORMAL HIGH (ref 26.0–34.0)
MCHC: 33.7 g/dL (ref 30.0–36.0)
MCV: 101.2 fL — ABNORMAL HIGH (ref 80.0–100.0)
Monocytes Absolute: 1 10*3/uL (ref 0.1–1.0)
Monocytes Relative: 12 %
Neutro Abs: 5.6 10*3/uL (ref 1.7–7.7)
Neutrophils Relative %: 62 %
Platelets: 146 10*3/uL — ABNORMAL LOW (ref 150–400)
RBC: 3.34 MIL/uL — ABNORMAL LOW (ref 4.22–5.81)
RDW: 18.5 % — ABNORMAL HIGH (ref 11.5–15.5)
WBC: 8.9 10*3/uL (ref 4.0–10.5)
nRBC: 0.3 % — ABNORMAL HIGH (ref 0.0–0.2)

## 2021-10-16 LAB — RESP PANEL BY RT-PCR (FLU A&B, COVID) ARPGX2
Influenza A by PCR: NEGATIVE
Influenza B by PCR: NEGATIVE
SARS Coronavirus 2 by RT PCR: POSITIVE — AB

## 2021-10-16 LAB — I-STAT CHEM 8, ED
BUN: 28 mg/dL — ABNORMAL HIGH (ref 6–20)
Calcium, Ion: 1.16 mmol/L (ref 1.15–1.40)
Chloride: 100 mmol/L (ref 98–111)
Creatinine, Ser: 0.7 mg/dL (ref 0.61–1.24)
Glucose, Bld: 90 mg/dL (ref 70–99)
HCT: 36 % — ABNORMAL LOW (ref 39.0–52.0)
Hemoglobin: 12.2 g/dL — ABNORMAL LOW (ref 13.0–17.0)
Potassium: 6.5 mmol/L (ref 3.5–5.1)
Sodium: 135 mmol/L (ref 135–145)
TCO2: 28 mmol/L (ref 22–32)

## 2021-10-16 LAB — COMPREHENSIVE METABOLIC PANEL
ALT: 270 U/L — ABNORMAL HIGH (ref 0–44)
AST: 583 U/L — ABNORMAL HIGH (ref 15–41)
Albumin: 1.7 g/dL — ABNORMAL LOW (ref 3.5–5.0)
Alkaline Phosphatase: 246 U/L — ABNORMAL HIGH (ref 38–126)
Anion gap: 3 — ABNORMAL LOW (ref 5–15)
BUN: 30 mg/dL — ABNORMAL HIGH (ref 6–20)
CO2: 27 mmol/L (ref 22–32)
Calcium: 8.4 mg/dL — ABNORMAL LOW (ref 8.9–10.3)
Chloride: 99 mmol/L (ref 98–111)
Creatinine, Ser: 0.6 mg/dL — ABNORMAL LOW (ref 0.61–1.24)
GFR, Estimated: >60 mL/min (ref >60)
Glucose, Bld: 98 mg/dL (ref 70–99)
Potassium: 6.6 mmol/L (ref 3.5–5.1)
Sodium: 129 mmol/L — ABNORMAL LOW (ref 135–145)
Total Bilirubin: 7.9 mg/dL — ABNORMAL HIGH (ref 0.3–1.2)
Total Protein: 7.8 g/dL (ref 6.5–8.1)

## 2021-10-16 LAB — RAPID URINE DRUG SCREEN, HOSP PERFORMED
Amphetamines: NOT DETECTED
Barbiturates: NOT DETECTED
Benzodiazepines: NOT DETECTED
Cocaine: NOT DETECTED
Opiates: POSITIVE — AB
Tetrahydrocannabinol: NOT DETECTED

## 2021-10-16 LAB — URINALYSIS, ROUTINE W REFLEX MICROSCOPIC
Bilirubin Urine: NEGATIVE
Glucose, UA: NEGATIVE mg/dL
Hgb urine dipstick: NEGATIVE
Ketones, ur: 5 mg/dL — AB
Leukocytes,Ua: NEGATIVE
Nitrite: NEGATIVE
Protein, ur: NEGATIVE mg/dL
Specific Gravity, Urine: 1.02 (ref 1.005–1.030)
pH: 6 (ref 5.0–8.0)

## 2021-10-16 LAB — BLOOD GAS, VENOUS
Acid-Base Excess: 5 mmol/L — ABNORMAL HIGH (ref 0.0–2.0)
Bicarbonate: 29.9 mmol/L — ABNORMAL HIGH (ref 20.0–28.0)
O2 Saturation: 73.8 %
Patient temperature: 37
pCO2, Ven: 44 mmHg (ref 44–60)
pH, Ven: 7.44 — ABNORMAL HIGH (ref 7.25–7.43)
pO2, Ven: 46 mmHg — ABNORMAL HIGH (ref 32–45)

## 2021-10-16 LAB — LACTIC ACID, PLASMA
Lactic Acid, Venous: 2.2 mmol/L (ref 0.5–1.9)
Lactic Acid, Venous: 2.5 mmol/L (ref 0.5–1.9)

## 2021-10-16 LAB — AMMONIA: Ammonia: 52 umol/L — ABNORMAL HIGH (ref 9–35)

## 2021-10-16 LAB — MAGNESIUM: Magnesium: 2.7 mg/dL — ABNORMAL HIGH (ref 1.7–2.4)

## 2021-10-16 LAB — CBG MONITORING, ED: Glucose-Capillary: 79 mg/dL (ref 70–99)

## 2021-10-16 MED ORDER — LIDOCAINE-EPINEPHRINE (PF) 2 %-1:200000 IJ SOLN
10.0000 mL | Freq: Once | INTRAMUSCULAR | Status: AC
  Administered 2021-10-16: 10 mL via INTRADERMAL
  Filled 2021-10-16: qty 20

## 2021-10-16 MED ORDER — LACTATED RINGERS IV SOLN: INTRAVENOUS | Status: DC

## 2021-10-16 MED ORDER — NALOXONE HCL 0.4 MG/ML IJ SOLN
0.2000 mg | Freq: Once | INTRAMUSCULAR | Status: AC
  Administered 2021-10-16: 0.2 mg via INTRAVENOUS
  Filled 2021-10-16: qty 1

## 2021-10-16 MED ORDER — INSULIN ASPART 100 UNIT/ML IV SOLN
5.0000 [IU] | Freq: Once | INTRAVENOUS | Status: AC
  Administered 2021-10-16: 5 [IU] via INTRAVENOUS
  Filled 2021-10-16: qty 0.05

## 2021-10-16 MED ORDER — SODIUM CHLORIDE 0.9 % IV BOLUS
500.0000 mL | Freq: Once | INTRAVENOUS | Status: AC
  Administered 2021-10-16: 500 mL via INTRAVENOUS

## 2021-10-16 MED ORDER — CALCIUM GLUCONATE-NACL 1-0.675 GM/50ML-% IV SOLN
1.0000 g | Freq: Once | INTRAVENOUS | Status: AC
  Administered 2021-10-16: 1000 mg via INTRAVENOUS
  Filled 2021-10-16: qty 50

## 2021-10-16 MED ORDER — DEXTROSE 50 % IV SOLN
1.0000 | Freq: Once | INTRAVENOUS | Status: AC
  Administered 2021-10-16: 50 mL via INTRAVENOUS
  Filled 2021-10-16: qty 50

## 2021-10-16 MED ORDER — FUROSEMIDE 10 MG/ML IJ SOLN
20.0000 mg | Freq: Once | INTRAMUSCULAR | Status: AC
  Administered 2021-10-16: 20 mg via INTRAVENOUS
  Filled 2021-10-16: qty 4

## 2021-10-16 MED ORDER — NALOXONE HCL 4 MG/10ML IJ SOLN
0.5000 mg/h | INTRAVENOUS | Status: DC
  Administered 2021-10-16: 0.25 mg/h via INTRAVENOUS
  Administered 2021-10-17 – 2021-10-18 (×3): 0.5 mg/h via INTRAVENOUS
  Filled 2021-10-16 (×5): qty 10

## 2021-10-16 NOTE — ED Notes (Signed)
Pt gone to CT 

## 2021-10-16 NOTE — ED Triage Notes (Signed)
Per EMS- Patient speaks Swahili only.  Patient's family reports that the patient has heart cancer and started Morphine approx 3 days ago. Patient's family reports that the patient has been acting different for the past 3-4 days and today, the found the patient in the closet standing and not speaking.

## 2021-10-16 NOTE — Telephone Encounter (Signed)
Social work Theatre manager left [employer] with assistance from [employer], attempting to schedule appointment for pt to meet with [employer] and interpreter at [facility] later this week.

## 2021-10-16 NOTE — ED Provider Notes (Signed)
[facility] DEPT Provider Note   CSN: [id number] Arrival date & time: 10/16/21  1823     History  Chief Complaint  Patient presents with   Altered Mental Status    Patrick Tran is a 51 y.o. male.  The history is provided by the patient. The history is limited by a language barrier. A language interpreter was used.  Patient presents for altered mental status.  He has been diagnosed with hepatocellular carcinoma.  He is followed by oncology, Dr. Burr Medico.  He began immunotherapy 2 weeks ago.  Last paracentesis was 12 days ago.  He was scheduled for 1 today.  He underwent EGD on 2/3 which showed esophageal varices.  These were banded.  Bilirubin 3 days ago was 6.  He was started on Lasix, morphine, and spironolactone.  Patient is unable to provide any history due to somnolence.    History per acquaintance: Patient was ambulatory and conversant yesterday.  He began to experience current symptoms last night.  These symptoms continued throughout the day.    Home Medications Prior to Admission medications   Medication Sig Start Date End Date Taking? Authorizing Provider  Amoxicill-Rifabutin-Omeprazole (TALICIA) 716-96.7-89 MG CPDR Take 4 capsules by mouth every 8 (eight) hours. 10/03/21   Pyrtle, Lajuan Lines, MD  furosemide (LASIX) 20 MG tablet Take 1 tablet (20 mg total) by mouth daily. 10/13/21   Truitt Merle, MD  morphine (MSIR) 15 MG tablet Take 0.5 tablets (7.5 mg total) by mouth every 4 (four) hours as needed for severe pain. 10/13/21   Volanda Napoleon, MD  omeprazole (PRILOSEC) 40 MG capsule Take 1 capsule (40 mg total) by mouth 2 (two) times daily before a meal. 09/29/21   Mansouraty, Telford Nab., MD  ondansetron (ZOFRAN) 4 MG tablet Take 1 tablet (4 mg total) by mouth every 8 (eight) hours as needed for nausea or vomiting. 09/29/21   Mansouraty, Telford Nab., MD  spironolactone (ALDACTONE) 100 MG tablet Take 1 tablet (100 mg total) by mouth daily. 09/11/21   Rai,  Vernelle Emerald, MD  spironolactone (ALDACTONE) 50 MG tablet Take 1 tablet (50 mg total) by mouth daily. 10/13/21   Truitt Merle, MD  traMADol (ULTRAM) 50 MG tablet Take 1 tablet (50 mg total) by mouth every 6 (six) hours as needed for moderate pain or severe pain. 09/10/21 09/10/22  Mendel Corning, MD      Allergies    Patient has no known allergies.    Review of Systems   Review of Systems  Unable to perform ROS: Mental status change   Physical Exam Updated Vital Signs BP 125/77    Pulse (!) 105    Temp (!) 97.5 F (36.4 C) (Axillary)    Resp 10    SpO2 98%  Physical Exam Constitutional:      Appearance: He is ill-appearing. He is not toxic-appearing or diaphoretic.  HENT:     Right Ear: External ear normal.     Left Ear: External ear normal.     Nose: Nose normal.     Mouth/Throat:     Mouth: Mucous membranes are dry.     Pharynx: Oropharynx is clear.  Eyes:     General: Scleral icterus present.     Extraocular Movements: Extraocular movements intact.  Cardiovascular:     Rate and Rhythm: Regular rhythm. Tachycardia present.  Pulmonary:     Effort: Pulmonary effort is normal. No respiratory distress.     Breath sounds: Normal breath sounds. No  wheezing.  Chest:     Chest wall: No tenderness.  Abdominal:     General: There is distension.     Palpations: Abdomen is soft.     Tenderness: There is no abdominal tenderness.  Musculoskeletal:     Cervical back: Neck supple. No rigidity.     Right lower leg: Edema present.     Left lower leg: Edema present.  Skin:    General: Skin is warm and dry.  Neurological:     General: No focal deficit present.     Mental Status: He is lethargic and disoriented.     GCS: GCS eye subscore is 3. GCS verbal subscore is 3. GCS motor subscore is 6.     Cranial Nerves: No facial asymmetry.     Motor: No abnormal muscle tone.    ED Results / Procedures / Treatments   Labs (all labs ordered are listed, but only abnormal results are  displayed) Labs Reviewed  RESP PANEL BY RT-PCR (FLU A&B, COVID) ARPGX2 - Abnormal; Notable for the following components:      Result Value   SARS Coronavirus 2 by RT PCR POSITIVE (*)    All other components within normal limits  AMMONIA - Abnormal; Notable for the following components:   Ammonia 52 (*)    All other components within normal limits  CBC WITH DIFFERENTIAL/PLATELET - Abnormal; Notable for the following components:   RBC 3.34 (*)    Hemoglobin 11.4 (*)    HCT 33.8 (*)    MCV 101.2 (*)    MCH 34.1 (*)    RDW 18.5 (*)    Platelets 146 (*)    nRBC 0.3 (*)    Abs Immature Granulocytes 0.24 (*)    All other components within normal limits  COMPREHENSIVE METABOLIC PANEL - Abnormal; Notable for the following components:   Sodium 129 (*)    Potassium 6.6 (*)    BUN 30 (*)    Creatinine, Ser 0.60 (*)    Calcium 8.4 (*)    Albumin 1.7 (*)    AST 583 (*)    ALT 270 (*)    Alkaline Phosphatase 246 (*)    Total Bilirubin 7.9 (*)    Anion gap 3 (*)    All other components within normal limits  URINALYSIS, ROUTINE W REFLEX MICROSCOPIC - Abnormal; Notable for the following components:   Color, Urine AMBER (*)    Ketones, ur 5 (*)    All other components within normal limits  LACTIC ACID, PLASMA - Abnormal; Notable for the following components:   Lactic Acid, Venous 2.2 (*)    All other components within normal limits  LACTIC ACID, PLASMA - Abnormal; Notable for the following components:   Lactic Acid, Venous 2.5 (*)    All other components within normal limits  RAPID URINE DRUG SCREEN, HOSP PERFORMED - Abnormal; Notable for the following components:   Opiates POSITIVE (*)    All other components within normal limits  BLOOD GAS, VENOUS - Abnormal; Notable for the following components:   pH, Ven 7.44 (*)    pO2, Ven 46 (*)    Bicarbonate 29.9 (*)    Acid-Base Excess 5.0 (*)    All other components within normal limits  MAGNESIUM - Abnormal; Notable for the following  components:   Magnesium 2.7 (*)    All other components within normal limits  I-STAT CHEM 8, ED - Abnormal; Notable for the following components:   Potassium 6.5 (*)  BUN 28 (*)    Hemoglobin 12.2 (*)    HCT 36.0 (*)    All other components within normal limits  CULTURE, BLOOD (ROUTINE X 2)  CULTURE, BLOOD (ROUTINE X 2)  URINE CULTURE  CBG MONITORING, ED    EKG EKG Interpretation  Date/Time:  Monday October 16 2021 19:32:07 EST Ventricular Rate:  97 PR Interval:  157 QRS Duration: 97 QT Interval:  369 QTC Calculation: 469 R Axis:   95 Text Interpretation: Sinus rhythm Borderline right axis deviation Borderline T wave abnormalities Confirmed by Godfrey Pick [phone]) on 10/16/2021 7:54:45 PM  Radiology CT HEAD WO CONTRAST  Result Date: 10/16/2021 CLINICAL DATA:  Mental status change EXAM: CT HEAD WITHOUT CONTRAST TECHNIQUE: Contiguous axial images were obtained from the base of the skull through the vertex without intravenous contrast. RADIATION DOSE REDUCTION: This exam was performed according to the departmental dose-optimization program which includes automated exposure control, adjustment of the mA and/or kV according to patient size and/or use of iterative reconstruction technique. COMPARISON:  CT brain report 08/19/2009 FINDINGS: Brain: No evidence of acute infarction, hemorrhage, hydrocephalus, extra-axial collection or mass lesion/mass effect. Vascular: No hyperdense vessel or unexpected calcification. Skull: Normal. Negative for fracture or focal lesion. Sinuses/Orbits: No acute finding. Other: None IMPRESSION: Negative non contrasted CT appearance of the brain. Electronically Signed   By: Donavan Foil M.D.   On: 10/16/2021 20:40   DG Chest Port 1 View  Result Date: 10/16/2021 CLINICAL DATA:  Altered mental status. EXAM: PORTABLE CHEST 1 VIEW COMPARISON:  None. FINDINGS: Mild, stable right-sided volume loss is seen with mild, stable atelectatic changes noted within the right  lung base. There is no evidence of an acute infiltrate, pleural effusion or pneumothorax. The heart size and mediastinal contours are within normal limits. The visualized skeletal structures are unremarkable. IMPRESSION: Stable right-sided volume loss with mild, stable right basilar atelectasis. Electronically Signed   By: Virgina Norfolk M.D.   On: 10/16/2021 19:38    Procedures Procedures    Medications Ordered in ED Medications  naloxone HCl (NARCAN) 4 mg in dextrose 5 % 250 mL infusion (0.25 mg/hr Intravenous New Bag/Given 10/16/21 2357)  lactated ringers infusion ( Intravenous New Bag/Given 10/16/21 2356)  naloxone [facility]) injection 0.2 mg (0.2 mg Intravenous Given 10/16/21 1931)  lidocaine-EPINEPHrine (XYLOCAINE W/EPI) 2 %-1:200000 (PF) injection 10 mL (10 mLs Intradermal Given 10/16/21 2211)  sodium chloride 0.9 % bolus 500 mL (0 mLs Intravenous Stopped 10/16/21 2334)  furosemide (LASIX) injection 20 mg (20 mg Intravenous Given 10/16/21 2205)  insulin aspart (novoLOG) injection 5 Units (5 Units Intravenous Given 10/16/21 2206)    And  dextrose 50 % solution 50 mL (50 mLs Intravenous Given 10/16/21 2206)  calcium gluconate 1 g/ 50 mL sodium chloride IVPB (0 mg Intravenous Stopped 10/16/21 2334)  naloxone (NARCAN) injection 0.2 mg (0.2 mg Intravenous Given 10/16/21 2216)    ED Course/ Medical Decision Making/ A&P                           Medical Decision Making Amount and/or Complexity of Data Reviewed Labs: ordered. Radiology: ordered.  Risk OTC drugs. Prescription drug management.   This patient presents to the ED for concern of altered mental status, this involves an extensive number of treatment options, and is a complaint that carries with it a high risk of complications and morbidity.  The differential diagnosis includes hepatic encephalopathy, CVA, polypharmacy, opiate intoxication, sepsis   Co  morbidities that complicate the patient evaluation  Hepatocellular carcinoma,  hepatitis C, cirrhosis   Additional history obtained:  Additional history obtained from EMS External records from outside source obtained and reviewed including EMR   Lab Tests:  I Ordered, and personally interpreted labs.  The pertinent results include: Hyperkalemia, lactic acidosis, COVID-19 positive, increasing hepatobiliary enzymes, no leukocytosis, baseline anemia   Imaging Studies ordered:  I ordered imaging studies including CT head, chest x-ray I independently visualized and interpreted imaging which showed no acute findings I agree with the radiologist interpretation   Cardiac Monitoring:  The patient was maintained on a cardiac monitor.  I personally viewed and interpreted the cardiac monitored which showed an underlying rhythm of: Sinus   Medicines ordered and prescription drug management:  I ordered medication including Narcan for opiate narcosis; calcium gluconate, NS bolus, Lasix, dextrose/insulin for temporization of hyperkalemia Reevaluation of the patient after these medicines showed that the patient improved I have reviewed the patients home medicines and have made adjustments as needed   Test Considered:  Diagnostic paracentesis, deferred due to the patient's soft and nontender abdomen   Critical Interventions:  Temporization of hyperkalemia, Narcan for opiate narcosis and hypopnea   Problem List / ED Course:  Patient is a 51 year old male who is currently undergoing treatment for hepatocellular carcinoma.  This treatment is palliative in nature.  This is an incurable disease for him.  He was started on immunotherapy this month.  He has undergone esophageal varices banding and therapeutic paracenteses.  He presents today by EMS for altered mental status.  He was recently prescribed Lasix, spironolactone, and morphine.  On arrival, patient is somnolent and unable to participate in any history taking.  He is able to follow some commands.  Initial GCS was  12.  He is tachycardic but maintaining normal blood pressure.  His abdomen is distended but soft.  It does not appear tender to palpation.  A 0.2 mg dose of Narcan was trialed and patient did have subsequent decreased somnolence.  I suspect that his altered mental state is, at least in part, due to opiate intoxication.  Laboratory work-up was initiated to further identify any underlying causes.  Lab work showed hyperkalemia.  Patient was given temporizing medication: IV fluids, Lasix, calcium gluconate, insulin/dextrose.  Patient's ammonia was found to be 52.  This may also be contributing to his somnolence.  No lactulose was given due to aspiration risk.  Patient was also found to be COVID-19 positive.  While in the ED, patient had recurrence of somnolence.  He was given an additional 0.2 mg of Narcan and this did have good effect in making him more awake and alert.  Given his liver failure, I do suspect that he had accumulation of morphine, which has been recently prescribed to him.  He did have recurrence of somnolence and was ultimately started on Narcan drip.  Patient to be admitted to hospitalist for further management.   Reevaluation:  After the interventions noted above, I reevaluated the patient and found that they have :improved   Social Determinants of Health:  Hepatocellular carcinoma, non-English-speaking   Dispostion:  After consideration of the diagnostic results and the patients response to treatment, I feel that the patent would benefit from admission.    CRITICAL CARE Performed by: Godfrey Pick   Total critical care time: 35 minutes  Critical care time was exclusive of separately billable procedures and treating other patients.  Critical care was necessary to treat or prevent imminent or  life-threatening deterioration.  Critical care was time spent personally by me on the following activities: development of treatment plan with patient and/or surrogate as well as nursing,  discussions with consultants, evaluation of patient's response to treatment, examination of patient, obtaining history from patient or surrogate, ordering and performing treatments and interventions, ordering and review of laboratory studies, ordering and review of radiographic studies, pulse oximetry and re-evaluation of patient's condition.         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

## 2021-10-16 NOTE — Progress Notes (Signed)
PA approved for morphine sulfate 15 mg through [facility] from 10/16/21-10/16/22.

## 2021-10-17 ENCOUNTER — Telehealth: Payer: Self-pay

## 2021-10-17 DIAGNOSIS — R188 Other ascites: Secondary | ICD-10-CM | POA: Diagnosis present

## 2021-10-17 DIAGNOSIS — K7469 Other cirrhosis of liver: Secondary | ICD-10-CM | POA: Diagnosis present

## 2021-10-17 DIAGNOSIS — R180 Malignant ascites: Secondary | ICD-10-CM | POA: Diagnosis not present

## 2021-10-17 DIAGNOSIS — G9341 Metabolic encephalopathy: Secondary | ICD-10-CM | POA: Diagnosis not present

## 2021-10-17 DIAGNOSIS — E875 Hyperkalemia: Secondary | ICD-10-CM

## 2021-10-17 DIAGNOSIS — Z66 Do not resuscitate: Secondary | ICD-10-CM | POA: Diagnosis present

## 2021-10-17 DIAGNOSIS — U071 COVID-19: Secondary | ICD-10-CM

## 2021-10-17 DIAGNOSIS — C220 Liver cell carcinoma: Secondary | ICD-10-CM | POA: Diagnosis present

## 2021-10-17 DIAGNOSIS — T402X5A Adverse effect of other opioids, initial encounter: Secondary | ICD-10-CM | POA: Diagnosis present

## 2021-10-17 DIAGNOSIS — T40604A Poisoning by unspecified narcotics, undetermined, initial encounter: Secondary | ICD-10-CM | POA: Diagnosis not present

## 2021-10-17 DIAGNOSIS — Z79899 Other long term (current) drug therapy: Secondary | ICD-10-CM | POA: Diagnosis not present

## 2021-10-17 DIAGNOSIS — E162 Hypoglycemia, unspecified: Secondary | ICD-10-CM | POA: Diagnosis not present

## 2021-10-17 DIAGNOSIS — R4182 Altered mental status, unspecified: Secondary | ICD-10-CM | POA: Diagnosis present

## 2021-10-17 DIAGNOSIS — T40601A Poisoning by unspecified narcotics, accidental (unintentional), initial encounter: Secondary | ICD-10-CM

## 2021-10-17 DIAGNOSIS — B182 Chronic viral hepatitis C: Secondary | ICD-10-CM | POA: Diagnosis present

## 2021-10-17 DIAGNOSIS — E8779 Other fluid overload: Secondary | ICD-10-CM | POA: Diagnosis present

## 2021-10-17 DIAGNOSIS — J9811 Atelectasis: Secondary | ICD-10-CM | POA: Diagnosis present

## 2021-10-17 DIAGNOSIS — I8510 Secondary esophageal varices without bleeding: Secondary | ICD-10-CM | POA: Diagnosis present

## 2021-10-17 DIAGNOSIS — Z515 Encounter for palliative care: Secondary | ICD-10-CM | POA: Diagnosis not present

## 2021-10-17 DIAGNOSIS — K7682 Hepatic encephalopathy: Secondary | ICD-10-CM | POA: Diagnosis present

## 2021-10-17 DIAGNOSIS — E871 Hypo-osmolality and hyponatremia: Secondary | ICD-10-CM | POA: Diagnosis not present

## 2021-10-17 DIAGNOSIS — G928 Other toxic encephalopathy: Secondary | ICD-10-CM | POA: Diagnosis present

## 2021-10-17 DIAGNOSIS — F1190 Opioid use, unspecified, uncomplicated: Secondary | ICD-10-CM | POA: Insufficient documentation

## 2021-10-17 LAB — CBC WITH DIFFERENTIAL/PLATELET
Abs Immature Granulocytes: 0.1 10*3/uL — ABNORMAL HIGH (ref 0.00–0.07)
Basophils Absolute: 0 10*3/uL (ref 0.0–0.1)
Basophils Relative: 0 %
Eosinophils Absolute: 0.1 10*3/uL (ref 0.0–0.5)
Eosinophils Relative: 1 %
HCT: 28.7 % — ABNORMAL LOW (ref 39.0–52.0)
Hemoglobin: 9.9 g/dL — ABNORMAL LOW (ref 13.0–17.0)
Immature Granulocytes: 2 %
Lymphocytes Relative: 29 %
Lymphs Abs: 1.8 10*3/uL (ref 0.7–4.0)
MCH: 34.6 pg — ABNORMAL HIGH (ref 26.0–34.0)
MCHC: 34.5 g/dL (ref 30.0–36.0)
MCV: 100.3 fL — ABNORMAL HIGH (ref 80.0–100.0)
Monocytes Absolute: 0.7 10*3/uL (ref 0.1–1.0)
Monocytes Relative: 11 %
Neutro Abs: 3.6 10*3/uL (ref 1.7–7.7)
Neutrophils Relative %: 57 %
Platelets: 120 10*3/uL — ABNORMAL LOW (ref 150–400)
RBC: 2.86 MIL/uL — ABNORMAL LOW (ref 4.22–5.81)
RDW: 18.3 % — ABNORMAL HIGH (ref 11.5–15.5)
WBC: 6.2 10*3/uL (ref 4.0–10.5)
nRBC: 0.3 % — ABNORMAL HIGH (ref 0.0–0.2)

## 2021-10-17 LAB — COMPREHENSIVE METABOLIC PANEL
ALT: 237 U/L — ABNORMAL HIGH (ref 0–44)
AST: 506 U/L — ABNORMAL HIGH (ref 15–41)
Albumin: <1.5 g/dL — ABNORMAL LOW (ref 3.5–5.0)
Alkaline Phosphatase: 197 U/L — ABNORMAL HIGH (ref 38–126)
Anion gap: 3 — ABNORMAL LOW (ref 5–15)
BUN: 26 mg/dL — ABNORMAL HIGH (ref 6–20)
CO2: 26 mmol/L (ref 22–32)
Calcium: 7.7 mg/dL — ABNORMAL LOW (ref 8.9–10.3)
Chloride: 101 mmol/L (ref 98–111)
Creatinine, Ser: 0.64 mg/dL (ref 0.61–1.24)
GFR, Estimated: >60 mL/min (ref >60)
Glucose, Bld: 220 mg/dL — ABNORMAL HIGH (ref 70–99)
Potassium: 5.3 mmol/L — ABNORMAL HIGH (ref 3.5–5.1)
Sodium: 130 mmol/L — ABNORMAL LOW (ref 135–145)
Total Bilirubin: 5.8 mg/dL — ABNORMAL HIGH (ref 0.3–1.2)
Total Protein: 6.5 g/dL (ref 6.5–8.1)

## 2021-10-17 LAB — GLUCOSE, CAPILLARY
Glucose-Capillary: 69 mg/dL — ABNORMAL LOW (ref 70–99)
Glucose-Capillary: 164 mg/dL — ABNORMAL HIGH (ref 70–99)
Glucose-Capillary: 147 mg/dL — ABNORMAL HIGH (ref 70–99)
Glucose-Capillary: 150 mg/dL — ABNORMAL HIGH (ref 70–99)
Glucose-Capillary: 150 mg/dL — ABNORMAL HIGH (ref 70–99)
Glucose-Capillary: 159 mg/dL — ABNORMAL HIGH (ref 70–99)
Glucose-Capillary: 135 mg/dL — ABNORMAL HIGH (ref 70–99)

## 2021-10-17 LAB — BASIC METABOLIC PANEL
Anion gap: 4 — ABNORMAL LOW (ref 5–15)
Anion gap: 3 — ABNORMAL LOW (ref 5–15)
BUN: 29 mg/dL — ABNORMAL HIGH (ref 6–20)
BUN: 26 mg/dL — ABNORMAL HIGH (ref 6–20)
CO2: 27 mmol/L (ref 22–32)
CO2: 27 mmol/L (ref 22–32)
Calcium: 8.2 mg/dL — ABNORMAL LOW (ref 8.9–10.3)
Calcium: 7.9 mg/dL — ABNORMAL LOW (ref 8.9–10.3)
Chloride: 100 mmol/L (ref 98–111)
Chloride: 101 mmol/L (ref 98–111)
Creatinine, Ser: 0.51 mg/dL — ABNORMAL LOW (ref 0.61–1.24)
Creatinine, Ser: 0.68 mg/dL (ref 0.61–1.24)
GFR, Estimated: >60 mL/min (ref >60)
GFR, Estimated: >60 mL/min (ref >60)
Glucose, Bld: 63 mg/dL — ABNORMAL LOW (ref 70–99)
Glucose, Bld: 200 mg/dL — ABNORMAL HIGH (ref 70–99)
Potassium: 6 mmol/L — ABNORMAL HIGH (ref 3.5–5.1)
Potassium: 5.2 mmol/L — ABNORMAL HIGH (ref 3.5–5.1)
Sodium: 131 mmol/L — ABNORMAL LOW (ref 135–145)
Sodium: 131 mmol/L — ABNORMAL LOW (ref 135–145)

## 2021-10-17 LAB — MAGNESIUM: Magnesium: 2.2 mg/dL (ref 1.7–2.4)

## 2021-10-17 LAB — AMMONIA: Ammonia: 49 umol/L — ABNORMAL HIGH (ref 9–35)

## 2021-10-17 LAB — PROTIME-INR
INR: 1.8 — ABNORMAL HIGH (ref 0.8–1.2)
Prothrombin Time: 20.4 seconds — ABNORMAL HIGH (ref 11.4–15.2)

## 2021-10-17 LAB — MRSA NEXT GEN BY PCR, NASAL: MRSA by PCR Next Gen: NOT DETECTED

## 2021-10-17 MED ORDER — DEXTROSE-NACL 5-0.9 % IV SOLN: INTRAVENOUS | Status: DC

## 2021-10-17 MED ORDER — ORAL CARE MOUTH RINSE
15.0000 mL | Freq: Two times a day (BID) | OROMUCOSAL | Status: DC
  Administered 2021-10-17 – 2021-10-20 (×6): 15 mL via OROMUCOSAL

## 2021-10-17 MED ORDER — DEXTROSE 10 % IV SOLN: INTRAVENOUS | Status: DC

## 2021-10-17 MED ORDER — SODIUM CHLORIDE 0.9 % IV SOLN: INTRAVENOUS | Status: DC

## 2021-10-17 MED ORDER — LACTULOSE ENEMA
300.0000 mL | Freq: Once | ORAL | Status: AC
  Administered 2021-10-17: 300 mL via RECTAL
  Filled 2021-10-17: qty 300

## 2021-10-17 MED ORDER — CHLORHEXIDINE GLUCONATE CLOTH 2 % EX PADS
6.0000 | MEDICATED_PAD | Freq: Every day | CUTANEOUS | Status: DC
  Administered 2021-10-17 – 2021-10-19 (×3): 6 via TOPICAL

## 2021-10-17 MED ORDER — DEXTROSE 50 % IV SOLN
1.0000 | Freq: Once | INTRAVENOUS | Status: AC
  Administered 2021-10-17: 50 mL via INTRAVENOUS
  Filled 2021-10-17: qty 50

## 2021-10-17 MED ORDER — ONDANSETRON HCL 4 MG/2ML IJ SOLN: 4.0000 mg | Freq: Four times a day (QID) | INTRAMUSCULAR | Status: DC | PRN

## 2021-10-17 NOTE — Subjective & Objective (Signed)
CC: AMS HPI: 51 year old African male with a history of stage III liver cancer, ascites who presents to the ER today with altered mental status.  There is no family members or friends with him at this time.  Someone did bring in bed with the EDP.  Apparently patient started chemotherapy for his liver cancer this past week.  He was also started on morphine around 17 February.  He has been increasingly somnolent over the last 2 to 3 days.  On arrival, patient was unresponsive.  Temperature 97.5, heart rate 100, blood pressure 138/81, satting 97% on room air.  Complete respond to intermittent doses of Narcan.  Narcan drip started by EDP.  Laboratory evaluation:  Positive for COVID-19 UDS positive for opiates Urinalysis negative Chemistry sodium 139, potassium 6.6, BUN of 30, creatinine 0.6  AST 583, ALT 270, alk phos 246  Total bili 7.9  White count 8.9, hemoglobin 1.4, platelets 146  [facility] hospitalist contacted for admission.

## 2021-10-17 NOTE — H&P (Signed)
History and Physical    Patrick Tran YJE:[id number] DOB: [birth year] DOA: 10/16/2021  DOS: the patient was seen and examined on 10/16/2021  PCP: Patient, No Pcp Per (Inactive)   Patient coming from: Home  I have personally briefly reviewed patient's old medical records in [place]  CC: AMS HPI: 51 year old African male with a history of stage III liver cancer, ascites who presents to the ER today with altered mental status.  There is no family members or friends with him at this time.  Someone did bring in bed with the EDP.  Apparently patient started chemotherapy for his liver cancer this past week.  He was also started on morphine around 17 February.  He has been increasingly somnolent over the last 2 to 3 days.  On arrival, patient was unresponsive.  Temperature 97.5, heart rate 100, blood pressure 138/81, satting 97% on room air.  Complete respond to intermittent doses of Narcan.  Narcan drip started by EDP.  Laboratory evaluation:  Positive for COVID-19 UDS positive for opiates Urinalysis negative Chemistry sodium 139, potassium 6.6, BUN of 30, creatinine 0.6  AST 583, ALT 270, alk phos 246  Total bili 7.9  White count 8.9, hemoglobin 1.4, platelets 146  [facility] contacted for admission.     ED Course: hyperkalemic. Treated with IV insulin, dextrose, IV calcium. Given multiple doses of IV narcan.  Review of Systems:  Review of Systems  Unable to perform ROS: Patient unresponsive   Past Medical History:  Diagnosis Date   CAP (community acquired pneumonia)    Cirrhosis ([place])    Hepatitis C virus infection    Hepatocellular carcinoma ([place])     Past Surgical History:  Procedure Laterality Date   BIOPSY  09/29/2021   Procedure: BIOPSY;  Surgeon: Rush Landmark Telford Nab., MD;  Location: [facility];  Service: Gastroenterology;;   ESOPHAGEAL BANDING  09/29/2021   Procedure: ESOPHAGEAL BANDING;  Surgeon: Irving Copas., MD;  Location:  [facility];  Service: Gastroenterology;;   ESOPHAGOGASTRODUODENOSCOPY (EGD) WITH PROPOFOL N/A 09/29/2021   Procedure: ESOPHAGOGASTRODUODENOSCOPY (EGD) WITH PROPOFOL;  Surgeon: Irving Copas., MD;  Location: [facility];  Service: Gastroenterology;  Laterality: N/A;   IR PARACENTESIS  09/08/2021   IR PARACENTESIS  10/04/2021     reports that he has never smoked. He does not have any smokeless tobacco history on file. He reports that he does not currently use alcohol. He reports that he does not use drugs.  No Known Allergies  Family History  Family history unknown: Yes    Prior to Admission medications   Medication Sig Start Date End Date Taking? Authorizing Provider  Amoxicill-Rifabutin-Omeprazole (TALICIA) 637-85.8-85 MG CPDR Take 4 capsules by mouth every 8 (eight) hours. 10/03/21   Pyrtle, Lajuan Lines, MD  furosemide (LASIX) 20 MG tablet Take 1 tablet (20 mg total) by mouth daily. 10/13/21   Truitt Merle, MD  morphine (MSIR) 15 MG tablet Take 0.5 tablets (7.5 mg total) by mouth every 4 (four) hours as needed for severe pain. 10/13/21   Volanda Napoleon, MD  omeprazole (PRILOSEC) 40 MG capsule Take 1 capsule (40 mg total) by mouth 2 (two) times daily before a meal. 09/29/21   Mansouraty, Telford Nab., MD  ondansetron (ZOFRAN) 4 MG tablet Take 1 tablet (4 mg total) by mouth every 8 (eight) hours as needed for nausea or vomiting. 09/29/21   Mansouraty, Telford Nab., MD  spironolactone (ALDACTONE) 100 MG tablet Take 1 tablet (100 mg total) by mouth daily. 09/11/21  Rai, Vernelle Emerald, MD  spironolactone (ALDACTONE) 50 MG tablet Take 1 tablet (50 mg total) by mouth daily. 10/13/21   Truitt Merle, MD  traMADol (ULTRAM) 50 MG tablet Take 1 tablet (50 mg total) by mouth every 6 (six) hours as needed for moderate pain or severe pain. 09/10/21 09/10/22  Mendel Corning, MD    Physical Exam: Vitals:   10/16/21 2330 10/16/21 2345 10/17/21 0015 10/17/21 0030  BP: 136/82 125/77 134/71 (!) 108/58  Pulse: (!) 106 (!)  105 (!) 106 (!) 107  Resp: 12 10 14 14   Temp:      TempSrc:      SpO2: 94% 98% 98% 98%    Physical Exam Vitals and nursing note reviewed.  Constitutional:      Comments: Thin African male laying on his side  HENT:     Head: Normocephalic.  Cardiovascular:     Rate and Rhythm: Regular rhythm. Tachycardia present.  Pulmonary:     Effort: No respiratory distress.  Abdominal:     General: There is distension.     Palpations: There is fluid wave and hepatomegaly.  Musculoskeletal:     Right lower leg: Edema present.     Left lower leg: Edema present.  Neurological:     Comments: Unresponsive. Maintaining his airway     Labs on Admission: I have personally reviewed following labs and imaging studies  CBC: Recent Labs  Lab 10/13/21 1139 10/16/21 1900 10/16/21 1951  WBC 6.9 8.9  --   NEUTROABS 4.4 5.6  --   HGB 11.7* 11.4* 12.2*  HCT 34.2* 33.8* 36.0*  MCV 98.0 101.2*  --   PLT 112* 146*  --    Basic Metabolic Panel: Recent Labs  Lab 10/13/21 1139 10/16/21 1900 10/16/21 1910 10/16/21 1951  NA 132* 129*  --  135  K 4.4 6.6*  --  6.5*  CL 104 99  --  100  CO2 25 27  --   --   GLUCOSE 100* 98  --  90  BUN 14 30*  --  28*  CREATININE 0.58* 0.60*  --  0.70  CALCIUM 8.0* 8.4*  --   --   MG  --   --  2.7*  --    GFR: Estimated Creatinine Clearance: 98.2 mL/min (by C-G formula based on SCr of 0.7 mg/dL). Liver Function Tests: Recent Labs  Lab 10/13/21 1139 10/16/21 1900  AST 262* 583*  ALT 136* 270*  ALKPHOS 263* 246*  BILITOT 6.0* 7.9*  PROT 8.2* 7.8  ALBUMIN 2.1* 1.7*   No results for input(s): LIPASE, AMYLASE in the last 168 hours. Recent Labs  Lab 10/16/21 1902  AMMONIA 52*   Coagulation Profile: No results for input(s): INR, PROTIME in the last 168 hours. Cardiac Enzymes: No results for input(s): CKTOTAL, CKMB, CKMBINDEX, TROPONINI in the last 168 hours. BNP (last 3 results) No results for input(s): PROBNP in the last 8760 hours. HbA1C: No  results for input(s): HGBA1C in the last 72 hours. CBG: Recent Labs  Lab 10/16/21 2001  GLUCAP 79   Lipid Profile: No results for input(s): CHOL, HDL, LDLCALC, TRIG, CHOLHDL, LDLDIRECT in the last 72 hours. Thyroid Function Tests: No results for input(s): TSH, T4TOTAL, FREET4, T3FREE, THYROIDAB in the last 72 hours. Anemia Panel: No results for input(s): VITAMINB12, FOLATE, FERRITIN, TIBC, IRON, RETICCTPCT in the last 72 hours. Urine analysis:    Component Value Date/Time   COLORURINE AMBER (A) 10/16/2021 2236   APPEARANCEUR CLEAR 10/16/2021  2236   LABSPEC 1.020 10/16/2021 2236   PHURINE 6.0 10/16/2021 2236   GLUCOSEU NEGATIVE 10/16/2021 2236   HGBUR NEGATIVE 10/16/2021 2236   BILIRUBINUR NEGATIVE 10/16/2021 2236   KETONESUR 5 (A) 10/16/2021 2236   PROTEINUR NEGATIVE 10/16/2021 2236   UROBILINOGEN 1.0 12/24/2013 1344   NITRITE NEGATIVE 10/16/2021 2236   LEUKOCYTESUR NEGATIVE 10/16/2021 2236    Radiological Exams on Admission: I have personally reviewed images CT HEAD WO CONTRAST  Result Date: 10/16/2021 CLINICAL DATA:  Mental status change EXAM: CT HEAD WITHOUT CONTRAST TECHNIQUE: Contiguous axial images were obtained from the base of the skull through the vertex without intravenous contrast. RADIATION DOSE REDUCTION: This exam was performed according to the departmental dose-optimization program which includes automated exposure control, adjustment of the mA and/or kV according to patient size and/or use of iterative reconstruction technique. COMPARISON:  CT brain report 08/19/2009 FINDINGS: Brain: No evidence of acute infarction, hemorrhage, hydrocephalus, extra-axial collection or mass lesion/mass effect. Vascular: No hyperdense vessel or unexpected calcification. Skull: Normal. Negative for fracture or focal lesion. Sinuses/Orbits: No acute finding. Other: None IMPRESSION: Negative non contrasted CT appearance of the brain. Electronically Signed   By: Donavan Foil M.D.   On:  10/16/2021 20:40   DG Chest Port 1 View  Result Date: 10/16/2021 CLINICAL DATA:  Altered mental status. EXAM: PORTABLE CHEST 1 VIEW COMPARISON:  None. FINDINGS: Mild, stable right-sided volume loss is seen with mild, stable atelectatic changes noted within the right lung base. There is no evidence of an acute infiltrate, pleural effusion or pneumothorax. The heart size and mediastinal contours are within normal limits. The visualized skeletal structures are unremarkable. IMPRESSION: Stable right-sided volume loss with mild, stable right basilar atelectasis. Electronically Signed   By: Virgina Norfolk M.D.   On: 10/16/2021 19:38    EKG: I have personally reviewed EKG: NSR    Assessment/Plan Principal Problem:   Opiate overdose (HCC) Active Problems:   Hyperkalemia   COVID-19 virus infection   Ascites   Hepatocellular carcinoma (HCC)   DNR (do not resuscitate)/DNI(Do Not Intubate)    Assessment and Plan: * Opiate overdose ([place]) Admit to SDU. EDP has started narcan gtts. Appears to have started morphine 4 days ago  COVID-19 virus infection On RA. Hold off on remdesivir and decadron.  Hyperkalemia Treated with IV insulin, dextrose, calcium. No renal failure. Pt is taking aldcatone, presumably for his ascites. Stop aldactone. Continue IVF.  DNR (do not resuscitate)/DNI(Do Not Intubate) Reviewed pt's DNR form that was his in folder that was brought with him.     Hepatocellular carcinoma ([place])- (present on admission) Started treatment recently with oncology.  Ascites- (present on admission) Last paracentesis 10-04-2021. abd is firm but not tense.   DVT prophylaxis: SCDs Code Status: DNR/DNI(Do NOT Intubate). Reviewed pt's DNR form Family Communication: no family at bedside  Disposition Plan: unknown  Consults called: none  Admission status: Inpatient, Step Down Unit   Kristopher Oppenheim, DO [facility] 10/17/2021, 12:53 AM

## 2021-10-17 NOTE — Telephone Encounter (Signed)
We can tentatively reschedule his PV and keep the 11/01/2021 appointment as scheduled, pending his current hospital course.  If prolonged course, might need to reassess the need for EGD, and possibly complete while inpatient.  If it is a short hospital course though, can plan for PV and proceed with EGD with banding on 3/8.

## 2021-10-17 NOTE — Progress Notes (Addendum)
Patient seen and examined this morning in the ED and then again at around 1 PM on the unit  I agree with the plan of care as per Dr. Bridgett Larsson  We increased the patient's Narcan GTT to 0.5 mg/H-he has not really responded he does pull away according to nursing staff when (he has had 1 stool today-he remains unintelligible-[name] his friend came by to visit although I was not available to see or talk to him at the time  Long discussion with Dr. Domingo Cocking of palliative care-going through the list of surrogate decision makers he has no family care in this area-his friends would probably be the next "people to act in good faith"-it is unknown what type of friendship or relationship they have with him The implication of this means that after all avenues are exhausted, it would be up to physicians to make the best estimation of the next steps in care plan  Discussion tomorrow will be predicated on whether his friends have good working knowledge of his wishes-as patient is already a DNR, suspect that if he does not respond to lactulose enema (ammonia was 49) and if he does not awaken we may be switching Narcan gtt. to possibly comfort related measures including Dilaudid gtt. under the guidance of palliative care  I believe discussion with Dr. Burr Medico will be integral to get to the nitty-gritty of what was discussed in the outpatient setting with the patient as he is unable to tell us-I appreciate Dr. Domingo Cocking taking the charge on that   If we have done this in due diligence, then he should be made full comfort in the next 24 to 48 hours and expect he would have a hospital death.  Verneita Griffes, MD [facility] 4:41 PM

## 2021-10-17 NOTE — Assessment & Plan Note (Addendum)
Apparently initially responded to narcan and overdose could have been component of encephalopathy or precipitate of hepatic encephalopathy, but with continued obtundation on narcan gtt, suspect this was limited component and hepatic encephalopathy driving process Off narcan Hold narcotics at discharge Caution with reinitiation

## 2021-10-17 NOTE — Assessment & Plan Note (Addendum)
Started treatment recently with oncology Last saw Dr. Burr Medico 10/13/2021 Follow with Dr. Burr Medico after discharge, message sent

## 2021-10-17 NOTE — ED Notes (Signed)
Pt found sitting on side of bed pulling at foley catheter; pt assisted back to bed and IV of left arm accidentally pulled out; IV restarted

## 2021-10-17 NOTE — Assessment & Plan Note (Addendum)
Ascites fluid not c/w SBP Cytology (no malignant cells) and culture NGx2 Therapeutic paracentesis with 4.8 L off on 2/24 Follow repeat cell count (not c/w SBP) and culture (pending) Lasix 40, spironolactone 100 at discharge

## 2021-10-17 NOTE — Assessment & Plan Note (Addendum)
On RA Possibly contributing to his encephalopathy No need for steroids as doing well on RA Isolate until after 3/2

## 2021-10-17 NOTE — Assessment & Plan Note (Addendum)
resolved 

## 2021-10-17 NOTE — Telephone Encounter (Signed)
Dr. Bryan Lemma, This patient is scheduled for a [facility] EGD 11/01/21 for gastritis. He was scheduled for a PV today. Patient was admitted to [facility] yesturday for narcotic overdose. Patient started chemo last week. PV was cancelled today due to hospitalization. Patient needs interpreter. Please advise if 3/8  [facility] EGD needs to be rescheduled or if we need to reschedule his PV and keep his 3/8/ appointment.

## 2021-10-17 NOTE — Assessment & Plan Note (Addendum)
Reviewed pt's DNR form that was his in folder that was brought with him.

## 2021-10-17 NOTE — Telephone Encounter (Signed)
Agree with attempt to keep previsit.  If he can't make previsit please let me know We want to try to make EGD happen if at all possible If previsit can't occur as scheduled perhaps one of our RNs on the office side can help (if it comes to that) Thanks JMP

## 2021-10-18 ENCOUNTER — Encounter: Payer: Self-pay | Admitting: Hematology

## 2021-10-18 ENCOUNTER — Inpatient Hospital Stay (HOSPITAL_COMMUNITY): Payer: BC Managed Care – PPO

## 2021-10-18 DIAGNOSIS — G9341 Metabolic encephalopathy: Secondary | ICD-10-CM

## 2021-10-18 DIAGNOSIS — K7682 Hepatic encephalopathy: Secondary | ICD-10-CM | POA: Insufficient documentation

## 2021-10-18 DIAGNOSIS — B182 Chronic viral hepatitis C: Secondary | ICD-10-CM

## 2021-10-18 DIAGNOSIS — Z7189 Other specified counseling: Secondary | ICD-10-CM

## 2021-10-18 DIAGNOSIS — K7460 Unspecified cirrhosis of liver: Secondary | ICD-10-CM

## 2021-10-18 DIAGNOSIS — E8770 Fluid overload, unspecified: Secondary | ICD-10-CM

## 2021-10-18 DIAGNOSIS — E871 Hypo-osmolality and hyponatremia: Secondary | ICD-10-CM

## 2021-10-18 LAB — GLUCOSE, CAPILLARY
Glucose-Capillary: 114 mg/dL — ABNORMAL HIGH (ref 70–99)
Glucose-Capillary: 115 mg/dL — ABNORMAL HIGH (ref 70–99)
Glucose-Capillary: 140 mg/dL — ABNORMAL HIGH (ref 70–99)
Glucose-Capillary: 141 mg/dL — ABNORMAL HIGH (ref 70–99)
Glucose-Capillary: 88 mg/dL (ref 70–99)
Glucose-Capillary: 97 mg/dL (ref 70–99)

## 2021-10-18 LAB — BASIC METABOLIC PANEL
Anion gap: 4 — ABNORMAL LOW (ref 5–15)
BUN: 12 mg/dL (ref 6–20)
CO2: 24 mmol/L (ref 22–32)
Calcium: 7.7 mg/dL — ABNORMAL LOW (ref 8.9–10.3)
Chloride: 100 mmol/L (ref 98–111)
Creatinine, Ser: <0.3 mg/dL — ABNORMAL LOW (ref 0.61–1.24)
Glucose, Bld: 92 mg/dL (ref 70–99)
Potassium: 5.5 mmol/L — ABNORMAL HIGH (ref 3.5–5.1)
Sodium: 128 mmol/L — ABNORMAL LOW (ref 135–145)

## 2021-10-18 LAB — URINALYSIS, COMPLETE (UACMP) WITH MICROSCOPIC
Glucose, UA: NEGATIVE mg/dL
Ketones, ur: NEGATIVE mg/dL
Nitrite: POSITIVE — AB
Protein, ur: NEGATIVE mg/dL
Specific Gravity, Urine: 1.013 (ref 1.005–1.030)
pH: 7 (ref 5.0–8.0)

## 2021-10-18 LAB — COMPREHENSIVE METABOLIC PANEL WITH GFR
ALT: 262 U/L — ABNORMAL HIGH (ref 0–44)
AST: 540 U/L — ABNORMAL HIGH (ref 15–41)
Albumin: <1.5 g/dL — ABNORMAL LOW (ref 3.5–5.0)
Alkaline Phosphatase: 196 U/L — ABNORMAL HIGH (ref 38–126)
Anion gap: 5 (ref 5–15)
BUN: 16 mg/dL (ref 6–20)
CO2: 24 mmol/L (ref 22–32)
Calcium: 8 mg/dL — ABNORMAL LOW (ref 8.9–10.3)
Chloride: 99 mmol/L (ref 98–111)
Creatinine, Ser: 0.45 mg/dL — ABNORMAL LOW (ref 0.61–1.24)
GFR, Estimated: >60 mL/min
Glucose, Bld: 127 mg/dL — ABNORMAL HIGH (ref 70–99)
Potassium: 4.2 mmol/L (ref 3.5–5.1)
Sodium: 128 mmol/L — ABNORMAL LOW (ref 135–145)
Total Bilirubin: 6.4 mg/dL — ABNORMAL HIGH (ref 0.3–1.2)
Total Protein: 6.8 g/dL (ref 6.5–8.1)

## 2021-10-18 LAB — BLOOD GAS, VENOUS
Acid-Base Excess: 3.6 mmol/L — ABNORMAL HIGH (ref 0.0–2.0)
Bicarbonate: 26.3 mmol/L (ref 20.0–28.0)
O2 Saturation: 99.5 %
Patient temperature: 36.3
pCO2, Ven: 32 mmHg — ABNORMAL LOW (ref 44–60)
pH, Ven: 7.52 — ABNORMAL HIGH (ref 7.25–7.43)
pO2, Ven: 119 mmHg — ABNORMAL HIGH (ref 32–45)

## 2021-10-18 LAB — CBC WITH DIFFERENTIAL/PLATELET
Abs Immature Granulocytes: 0.12 10*3/uL — ABNORMAL HIGH (ref 0.00–0.07)
Basophils Absolute: 0 10*3/uL (ref 0.0–0.1)
Basophils Relative: 0 %
Eosinophils Absolute: 0.1 10*3/uL (ref 0.0–0.5)
Eosinophils Relative: 2 %
HCT: 31.2 % — ABNORMAL LOW (ref 39.0–52.0)
Hemoglobin: 10.8 g/dL — ABNORMAL LOW (ref 13.0–17.0)
Immature Granulocytes: 2 %
Lymphocytes Relative: 30 %
Lymphs Abs: 2.2 10*3/uL (ref 0.7–4.0)
MCH: 34.5 pg — ABNORMAL HIGH (ref 26.0–34.0)
MCHC: 34.6 g/dL (ref 30.0–36.0)
MCV: 99.7 fL (ref 80.0–100.0)
Monocytes Absolute: 1.1 10*3/uL — ABNORMAL HIGH (ref 0.1–1.0)
Monocytes Relative: 15 %
Neutro Abs: 3.7 10*3/uL (ref 1.7–7.7)
Neutrophils Relative %: 51 %
Platelets: 125 10*3/uL — ABNORMAL LOW (ref 150–400)
RBC: 3.13 MIL/uL — ABNORMAL LOW (ref 4.22–5.81)
RDW: 17.9 % — ABNORMAL HIGH (ref 11.5–15.5)
WBC: 7.2 10*3/uL (ref 4.0–10.5)
nRBC: 0.8 % — ABNORMAL HIGH (ref 0.0–0.2)

## 2021-10-18 LAB — URINE CULTURE: Culture: NO GROWTH

## 2021-10-18 LAB — LACTATE DEHYDROGENASE, PLEURAL OR PERITONEAL FLUID: LD, Fluid: 52 U/L — ABNORMAL HIGH (ref 3–23)

## 2021-10-18 LAB — BODY FLUID CELL COUNT WITH DIFFERENTIAL
Eos, Fluid: 0 %
Lymphs, Fluid: 40 %
Monocyte-Macrophage-Serous Fluid: 26 % — ABNORMAL LOW (ref 50–90)
Neutrophil Count, Fluid: 34 % — ABNORMAL HIGH (ref 0–25)
Total Nucleated Cell Count, Fluid: 123 cu mm (ref 0–1000)

## 2021-10-18 LAB — AMMONIA
Ammonia: 77 umol/L — ABNORMAL HIGH (ref 9–35)
Ammonia: 128 umol/L — ABNORMAL HIGH (ref 9–35)

## 2021-10-18 LAB — FOLATE: Folate: 8.9 ng/mL (ref >5.9)

## 2021-10-18 LAB — VITAMIN B12: Vitamin B-12: 6764 pg/mL — ABNORMAL HIGH (ref 180–914)

## 2021-10-18 MED ORDER — THIAMINE HCL 100 MG/ML IJ SOLN
100.0000 mg | Freq: Every day | INTRAMUSCULAR | Status: DC
  Administered 2021-10-18 – 2021-10-21 (×4): 100 mg via INTRAVENOUS
  Filled 2021-10-18 (×4): qty 2

## 2021-10-18 MED ORDER — LACTULOSE ENEMA
300.0000 mL | Freq: Two times a day (BID) | ORAL | Status: DC
  Administered 2021-10-18: 300 mL via RECTAL
  Filled 2021-10-18 (×3): qty 300

## 2021-10-18 MED ORDER — FUROSEMIDE 10 MG/ML IJ SOLN
20.0000 mg | Freq: Once | INTRAMUSCULAR | Status: AC
  Administered 2021-10-18: 20 mg via INTRAVENOUS
  Filled 2021-10-18: qty 2

## 2021-10-18 MED ORDER — LIDOCAINE HCL 1 % IJ SOLN
INTRAMUSCULAR | Status: AC
  Administered 2021-10-18: 15 mL
  Filled 2021-10-18: qty 20

## 2021-10-18 MED ORDER — LACTULOSE ENEMA
300.0000 mL | ORAL | Status: DC
  Administered 2021-10-18 – 2021-10-19 (×2): 300 mL via RECTAL
  Filled 2021-10-18 (×4): qty 300

## 2021-10-18 MED ORDER — LACTULOSE ENEMA
300.0000 mL | Freq: Three times a day (TID) | ORAL | Status: DC
  Filled 2021-10-18: qty 300

## 2021-10-18 MED ORDER — SODIUM CHLORIDE 0.9 % IV SOLN
2.0000 g | INTRAVENOUS | Status: DC
  Administered 2021-10-18: 2 g via INTRAVENOUS
  Filled 2021-10-18: qty 20

## 2021-10-18 MED ORDER — NALOXONE HCL 0.4 MG/ML IJ SOLN
0.8000 mg | Freq: Once | INTRAMUSCULAR | Status: AC
  Administered 2021-10-18: 0.8 mg via INTRAVENOUS
  Filled 2021-10-18: qty 2

## 2021-10-18 NOTE — Progress Notes (Signed)
Applied on behalf of patient for copay assistance for Tecentriq through [place].  Patient approved for $25,000 10/18/21 - 08/26/22 leaving him with a $5 copay after insurance pays their portion.  The approval and debit card information shared with [place] for billing/claim submissions.  Information updated in [facility] as well.  The patient will receive a copy of the approval letter for his records only.  Attempted to apply for Zirabev copay assistance through [place], however it states diagnosis not covered. Will review if any other foundation assistance is available for diagnosis.

## 2021-10-18 NOTE — Progress Notes (Signed)
Daily Progress Note   Patient Name: Genie Mirabal       Date: 10/18/2021 DOB: December 05, 1970  Age: 51 y.o. MRN#: [id number] Attending Physician: Elodia Florence., * Primary Care Physician: Patient, No Pcp Per (Inactive) Admit Date: 10/16/2021  Reason for Consultation/Follow-up: Establishing goals of care  Subjective: Mr. Mccuiston is more awake today, but still not able to participate in conversation regarding his condition or care plan.  Discussed with Dr. Burr Medico and Dr. Florene Glen today regarding plan for continuation of current plan to see how he continues to do over the next couple of days with current care.  I attempted to reach out to friends today listed on chart but did not reach them.  Length of Stay: 1  Current Medications: Scheduled Meds:   Chlorhexidine Gluconate Cloth  6 each Topical Daily   lactulose  300 mL Rectal BID   mouth rinse  15 mL Mouth Rinse BID   thiamine injection  100 mg Intravenous Daily    Continuous Infusions:  dextrose 30 mL/hr at 10/18/21 1300    PRN Meds: ondansetron (ZOFRAN) IV  Physical Exam       General: Alert but does not participate in exam or communicate Heart: Regular rate and rhythm. Lungs: unlabored Ext: No significant edema  Vital Signs: BP (!) 145/91 (BP Location: Right Arm)    Pulse (!) 103    Temp (!) 97.4 F (36.3 C) (Axillary)    Resp 17    Ht 5\' 5"  (1.651 m)    Wt 63.2 kg    SpO2 99%    BMI 23.19 kg/m  SpO2: SpO2: 99 % O2 Device: O2 Device: Room Air O2 Flow Rate:    Intake/output summary:  Intake/Output Summary (Last 24 hours) at 10/18/2021 1346 Last data filed at 10/18/2021 1300 Gross per 24 hour  Intake 2971.23 ml  Output 650 ml  Net 2321.23 ml   LBM: Last BM Date : 10/17/21 Baseline Weight: Weight: 63.2  kg Most recent weight: Weight: 63.2 kg       Palliative Assessment/Data:      Patient Active Problem List   Diagnosis Date Noted   Acute metabolic encephalopathy 27/01/2375   Volume overload 10/18/2021   Complex care coordination 10/18/2021   Hyponatremia 10/18/2021   Hyperkalemia 10/17/2021   Opiate overdose ([place]) 10/17/2021  COVID-19 virus infection 10/17/2021   DNR (do not resuscitate)/DNI(Do Not Intubate) 10/17/2021   Hepatocellular carcinoma ([place])    Hepatic cirrhosis due to chronic hepatitis C infection ([place]) 09/08/2021   Ascites 09/08/2021   Abdominal pain 09/08/2021   Liver lesion 09/08/2021   Pancytopenia ([place]) 09/08/2021   DENTAL CARIES 11/25/2007    Palliative Care Assessment & Plan   Patient Profile: 51 y.o. male  with past medical history of hepatitis C, liver cirrhosis, ascites and recent diagnosis of liver cancer with esophageal varices and gastritis admitted on 10/16/2021 with altered mental status.  He was noted to have recently started on morphine for pain management and on arrival to ED was unresponsive.  He is documented to have had some response to Narcan infusion in ED, however, he remains very confused on the floor and is not able to participate in a conversation regarding goals of care.  He is also COVID-positive.  Recommendations/Plan: - DNR- This was established at outpatient appointment on 10/13/2021 - Currently encephalopathic and unable to discuss goals of care.  Plan to continue current care and reassess his capacity daily.   - If he is still unable to speak on his own behalf, will plan to discuss further with his friends to establish if he has available surrogate.   - Palliative to follow daily  Code Status:    Code Status Orders  (From admission, onward)           Start     Ordered   10/17/21 0802  Do not attempt resuscitation (DNR)  Continuous       Question Answer Comment  In the event of cardiac or respiratory ARREST Do not call a  code blue   In the event of cardiac or respiratory ARREST Do not perform Intubation, CPR, defibrillation or ACLS   In the event of cardiac or respiratory ARREST Use medication by any route, position, wound care, and other measures to relive pain and suffering. May use oxygen, suction and manual treatment of airway obstruction as needed for comfort.   Comments verified DNR form      10/17/21 0801           Code Status History     Date Active Date Inactive Code Status Order ID Comments User Context   09/08/2021 0353 09/10/2021 2232 Full Code [id number]  Rhetta Mura, DO ED       Prognosis:  Unable to determine  Discharge Planning: To Be Determined  Care plan was discussed with Dr. Florene Glen, Dr. Burr Medico  Thank you for allowing the Palliative Medicine Team to assist in the care of this patient.  Micheline Rough, MD  Please contact Palliative Medicine Team phone at [phone] for questions and concerns.

## 2021-10-18 NOTE — Progress Notes (Signed)
Initial Nutrition Assessment RD working remotely.   DOCUMENTATION CODES:   Not applicable  INTERVENTION:  - diet advancement as medically feasible.    NUTRITION DIAGNOSIS:   Increased nutrient needs related to cancer and cancer related treatments, acute illness, catabolic illness ([id number] infection) as evidenced by estimated needs.  GOAL:   Patient will meet greater than or equal to 90% of their needs  MONITOR:   Diet advancement, Labs, Weight trends  REASON FOR ASSESSMENT:   Malnutrition Screening Tool  ASSESSMENT:   51 year old male with medical history of stage 3 hepatocellular carcinoma recently started on chemo, hepatitis C, and cirrhosis with associated ascites. He presented to the ED with AMS and was noted to be unresponsive on arrival. He was given intermittent doses of Narcan and started on Narcan drip in the ED. In the ED he was found to be COVID-19 positive. UDS was positive for opiates. He was admitted with dx of opiate overdose.  Patient has been NPO since admission. He is noted to need interpreter for [place], [place]. RD is working remotely.   Patient was assessed by Palliative Care yesterday with plan for follow-up today. Note indicates patient was confused and that a friend who speaks patient's native language indicated patient was not making sense.  A RD at the [facility] assessed patient on 10/02/21. Interpreter services were used at that time. Note from that date indicates patient had abdominal varices banded ~1 week prior. He was on a soft diet and eating mostly vegetables and fruits, limited meat intake, and he was eating fish. He had tried a protein shake that he was unsure of the name of. RD talked with patient at that time about high protein, high calorie foods, provided him with samples of Ensure, and encouraged him to drink 2 bottles of Ensure (or equivalent)/day if feasible.  Weight yesterday was 139 lb and weight on 10/13/21 was 142 lb. Weight  was mainly stable (123-128 lb) from 09/18/21-10/02/21. Moderate pitting edema to BLE documented in the edema section of flow sheet and likely contributing to weight increase.    Labs reviewed; CBGs: 140, 141, 114 mg/dl, Na: 128 mmol/l, creatinine: 0.45 mg/dl, Ca: 8 mg/dl, Alk Phos elevated, LFTs elevated, ammonia: 77 umol/l.  Medications reviewed; 300 ml rectal lactulose BID, 100 mg IV thiamine/day.   IVF; D10 @ 50 ml/hr (408 kcal/24 hrs).    NUTRITION - FOCUSED PHYSICAL EXAM:  RD working remotely.  Diet Order:   Diet Order             Diet NPO time specified  Diet effective now                   EDUCATION NEEDS:   Not appropriate for education at this time  Skin:  Skin Assessment: Reviewed RN Assessment  Last BM:  PTA/unknown  Height:   Ht Readings from Last 1 Encounters:  10/17/21 5' 5"  (1.651 m)    Weight:   Wt Readings from Last 1 Encounters:  10/17/21 63.2 kg     BMI:  Body mass index is 23.19 kg/m.  Estimated Nutritional Needs:  Kcal:  2250-2500 kcal Protein:  115-130 grams Fluid:  >/= 2.2 L/day      Jarome Matin, MS, RD, LDN Inpatient Clinical Dietitian RD pager # available in [place]  After hours/weekend pager # available in [facility]

## 2021-10-18 NOTE — Telephone Encounter (Signed)
Vaughan Basta and Mickel Baas,  Please follow up the end of the week if this pt is discharged from hospital or still in patient and assist with rescheduling his [place] for his [facility] 11-01-21 or cancelling his procedure all together and RS both if he has a prolonged hospital stay  Thanks,Marie PV

## 2021-10-18 NOTE — Assessment & Plan Note (Addendum)
Due to volume Follow with diuresis

## 2021-10-18 NOTE — Consult Note (Signed)
Consultation Note Date: 10/18/2021   Patient Name: Patrick Tran  DOB: 1971-06-30  MRN: [id number]  Age / Sex: 51 y.o., male  PCP: Patient, No Pcp Per (Inactive) Referring Physician: Elodia Tran., *  Reason for Consultation: Establishing goals of care  HPI/Patient Profile: 51 y.o. male  with past medical history of hepatitis C, liver cirrhosis, ascites and recent diagnosis of liver cancer with esophageal varices and gastritis admitted on 10/16/2021 with altered mental status.  He was noted to have recently started on morphine for pain management and on arrival to ED was unresponsive.  He is documented to have had some response to Narcan infusion in ED, however, he remains very confused on the floor and is not able to participate in a conversation regarding goals of care.  He is also COVID-positive.  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  He left [place] in 1992 and moved to [place] in 2008.  He lives with a friend and has no children or blood relatives in the [place].  I saw and examined Patrick Tran.  He is confused.  Friends were in to visit earlier and he was not making sense or communicating with them when they spoke with him in his native language either.  I discussed his case in detail with Dr. Verlon Tran.  We reviewed his clinical course and also discussed surrogate decision making on his behalf.  He does not have a legal guardian, has no known named HCPOA, is not currently married, and he does not have available children, parents, brothers or sisters.  Therefore, "an individual who has an established relationship with (him), who is acting in good faith and who can convey (his) wishes" would be if next in line for surrogacy.  We discussed plan to see if his mental status improved to the point where he could  participate in goals of care conversation tomorrow.  If not, will reach out to Dr. Burr Tran to see if he had indicated any further wishes to her and will reach out to his friends to see if they have had any previous conversations with him or if their relationship is such that they would be appropriate to act on his behalf.  He has previously established DNR as an outpatient and this will be honored as limit of his care.  Discussed that there is potential this will be a case where we will need to act in his best interest as a medical team if there is not appropriate surrogate decision maker to act on his behalf.   SUMMARY OF RECOMMENDATIONS   - DNR- This was established at outpatient appointment on 10/13/2021 - Currently encephalopathic and unable to discuss goals of care.  Plan to continue current care and reassess his capacity tomorrow.  If he is still unable to speak on his own behalf, will plan to discuss further with his friends tomorrow to try to establish if he has available surrogate.   -  Palliative to follow-up tomorrow.  Time Total: 65 minutes  Signed by: Patrick Rough, MD   Please contact Palliative Medicine Team phone at [phone] for questions and concerns.  For individual provider: See Patrick Tran

## 2021-10-18 NOTE — Progress Notes (Addendum)
PROGRESS NOTE    Patrick Tran  TZG:[id number] DOB: 1970-11-24 DOA: 10/16/2021 PCP: Patient, No Pcp Per (Inactive)  Chief Complaint  Patient presents with   Altered Mental Status    Brief Narrative:  51 yo who came from [place] to [place] in 1992 (without children or blood relatives here in [place]), hx cirrhosis 2/2 hepatitis C, hepatocellular carcinoma, ascites who presented to the hospital with acute metabolic encephalopathy initially with concern for opiate overdose as well as incidentally covid positive.    Assessment & Plan:   Principal Problem:   Acute metabolic encephalopathy Active Problems:   Ascites   COVID-19 virus infection   Hepatocellular carcinoma (HCC)   Hyperkalemia   Volume overload   Hepatic cirrhosis due to chronic hepatitis C infection ([place])   Opiate overdose (HCC)   Hyponatremia   DNR (do not resuscitate)/DNI(Do Not Intubate)   Complex care coordination   Assessment and Plan: * Acute metabolic encephalopathy He's obtunded Unclear at this point Likely related to COVID vs hepatic encephalopathy vs related to malignancy or other causes (favor hepatic encephalopathy at this point with elevated ammonia?) Continue lactulose enema (received x1 so far 2/21) No great options for covid treatment or convincing evidence treatment indicated (no hypoxia, LFT's >10x normal, precluding use of remdesivir) Follow VBG, b12, folate, TSH (wnl 2/17), urinalysis, repeat chest x ray (2/20 with right basilar atelectasis), paracentesis to eval for SBP Head CT without acute abnormalities Follow EEG No meaningful response to narcan gtt, will try Patrick Tran bolus then discontinue this if no effect Low threshold for empiric abx - no clear/convincing evidence of infection at this point    COVID-19 virus infection On RA Possibly contributing to his encephalopathy No need for steroids as doing well on RA LFT's >10 x normal, not candidate for remdesivir  Ascites- (present on  admission) Diagnostic US paracentesis to r/o SBP  Hepatocellular carcinoma ([place])- (present on admission) Started treatment recently with oncology Last saw Dr. Burr Tran 10/13/2021 Will discuss his care with Dr. Burr Tran  Volume overload 2/2 cirrhosis Currently unable to take PO Limit fluids, consider lasix prn  Hyperkalemia Resolved  Hyponatremia 2/2 volume overload and D10W being administered Decreased D10 (continuing for now with hypoglycemia noted earlier) BMP this PM Planning for paracentesis which should help Patrick Tran bit with volume Will likely need diuresis  Opiate overdose ([place]) Hasn't responded to narcan, doubt this is cause of his encephalopathy Will d/c narcan gtt as above  Hepatic cirrhosis due to chronic hepatitis C infection ([place]) Follows with GI outpatient S/p banding on 2/3 of esophageal varices All oral meds on hold with encephalopathy Bili ~6.4, relatively stable with recent labs   DNR (do not resuscitate)/DNI(Do Not Intubate) Reviewed pt's DNR form that was his in folder that was brought with him.     Complex care coordination Appreciate Dr. Verlon Au and Dr. Kirstie Mirza input [name], unable to make any decisions at this point.  He has no family here, will need to establish surrogate decision maker.   Will see if Dr. Burr Tran can help with care as well.    Addendum, planning for paracentesis, Patrick Tran is encephalopathic and unable to consent.  No family is present, no clearly identified surrogate decision maker at this point  Will proceed with paracentesis on emergent basis in setting of his encephalopathy, I believe potential benefits outweigh risks (he's had this procedure 2x before with IR).  I discussed with his friend Patrick Tran and made him aware of our plans for Patrick Tran procedure today.  DVT prophylaxis: SCD Code Status: dnr Family Communication: none Disposition:   Status is: Inpatient Remains inpatient appropriate because: continued encephalopathy,  need for continued workup   Consultants:  Palliative care IR Oncology  Procedures:  none  Antimicrobials:  Anti-infectives (From admission, onward)    None       Subjective: obtunded  Objective: Vitals:   10/18/21 0200 10/18/21 0300 10/18/21 0400 10/18/21 0800  BP: (!) 145/84  (!) 147/79 (!) 145/91  Pulse: (!) 102  (!) 101 (!) 103  Resp: 15  18 17   Temp:  97.9 F (36.6 C)  (!) 97.4 F (36.3 C)  TempSrc:  Oral  Axillary  SpO2: 99%  100% 99%  Weight:      Height:        Intake/Output Summary (Last 24 hours) at 10/18/2021 1010 Last data filed at 10/18/2021 0800 Gross per 24 hour  Intake 2434.28 ml  Output 810 ml  Net 1624.28 ml   Filed Weights   10/17/21 0800  Weight: 63.2 kg    Examination:  General exam: obtunded, laying on L side Respiratory system: unlabored Cardiovascular system: RRR Gastrointestinal system: distended, tense abdomen - not clearly TTP Central nervous system: obtunded, only moans and withdraws from irritative stimuli, moves all extremities - PERRL  Extremities: bilateral LE edema  Data Reviewed: I have personally reviewed following labs and imaging studies  CBC: Recent Labs  Lab 10/13/21 1139 10/16/21 1900 10/16/21 1951 10/17/21 0836 10/18/21 0311  WBC 6.9 8.9  --  6.2 7.2  NEUTROABS 4.4 5.6  --  3.6 3.7  HGB 11.7* 11.4* 12.2* 9.9* 10.8*  HCT 34.2* 33.8* 36.0* 28.7* 31.2*  MCV 98.0 101.2*  --  100.3* 99.7  PLT 112* 146*  --  120* 125*    Basic Metabolic Panel: Recent Labs  Lab 10/13/21 1139 10/16/21 1900 10/16/21 1910 10/16/21 1951 10/17/21 0107 10/17/21 0836 10/18/21 0311  NA 132* 129*  --  135 131* 130*   131* 128*  K 4.4 6.6*  --  6.5* 6.0* 5.3*   5.2* 4.2  CL 104 99  --  100 100 101   101 99  CO2 25 27  --   --  27 26   27 24   GLUCOSE 100* 98  --  90 63* 220*   200* 127*  BUN 14 30*  --  28* 29* 26*   26* 16  CREATININE 0.58* 0.60*  --  0.70 0.51* 0.64   0.68 0.45*  CALCIUM 8.0* 8.4*  --   --  8.2* 7.7*    7.9* 8.0*  MG  --   --  2.7*  --   --  2.2  --     GFR: Estimated Creatinine Clearance: 98.2 mL/min (Patrick Tran) (by C-G formula based on SCr of 0.45 mg/dL (L)).  Liver Function Tests: Recent Labs  Lab 10/13/21 1139 10/16/21 1900 10/17/21 0836 10/18/21 0311  AST 262* 583* 506* 540*  ALT 136* 270* 237* 262*  ALKPHOS 263* 246* 197* 196*  BILITOT 6.0* 7.9* 5.8* 6.4*  PROT 8.2* 7.8 6.5 6.8  ALBUMIN 2.1* 1.7* <1.5* <1.5*    CBG: Recent Labs  Lab 10/17/21 1503 10/17/21 1925 10/18/21 0018 10/18/21 0357 10/18/21 0816  GLUCAP 159* 135* 140* 141* 114*     Recent Results (from the past 240 hour(s))  Resp Panel by RT-PCR (Flu Braeton Wolgamott&B, Covid) Nasopharyngeal Swab     Status: Abnormal   Collection Time: 10/16/21  7:10 PM   Specimen: Nasopharyngeal Swab; Nasopharyngeal(NP)  swabs in vial transport medium  Result Value Ref Range Status   SARS Coronavirus 2 by RT PCR POSITIVE (Yitzchok Carriger) NEGATIVE Final    Comment: (NOTE) SARS-CoV-2 target nucleic acids are DETECTED.  The SARS-CoV-2 RNA is generally detectable in upper respiratory specimens during the acute phase of infection. Positive results are indicative of the presence of the identified virus, but do not rule out bacterial infection or co-infection with other pathogens not detected by the test. Clinical correlation with patient history and other diagnostic information is necessary to determine patient infection status. The expected result is Negative.  Fact Sheet for Patients: EntrepreneurPulse.com.au  Fact Sheet for Healthcare Providers: IncredibleEmployment.be  This test is not yet approved or cleared by the [place] FDA and  has been authorized for detection and/or diagnosis of SARS-CoV-2 by FDA under an Emergency Use Authorization (EUA).  This EUA will remain in effect (meaning this test can be used) for the duration of  the COVID-19 declaration under Section 564(b)(1) of the [place], 21 U.S.C.  section 360bbb-3(b)(1), unless the authorization is terminated or revoked sooner.     Influenza Curstin Schmale by PCR NEGATIVE NEGATIVE Final   Influenza B by PCR NEGATIVE NEGATIVE Final    Comment: (NOTE) The Xpert Xpress SARS-CoV-2/FLU/RSV plus assay is intended as an aid in the diagnosis of influenza from Nasopharyngeal swab specimens and should not be used as Joniya Boberg sole basis for treatment. Nasal washings and aspirates are unacceptable for Xpert Xpress SARS-CoV-2/FLU/RSV testing.  Fact Sheet for Patients: EntrepreneurPulse.com.au  Fact Sheet for Healthcare Providers: IncredibleEmployment.be  This test is not yet approved or cleared by the [place] FDA and has been authorized for detection and/or diagnosis of SARS-CoV-2 by FDA under an Emergency Use Authorization (EUA). This EUA will remain in effect (meaning this test can be used) for the duration of the COVID-19 declaration under Section 564(b)(1) of the Act, 21 U.S.C. section 360bbb-3(b)(1), unless the authorization is terminated or revoked.  Performed at [facility], [place] [address], [place], [place] [address]   Blood Cultures (routine x 2)     Status: None (Preliminary result)   Collection Time: 10/16/21  7:10 PM   Specimen: BLOOD  Result Value Ref Range Status   Specimen Description   Final    BLOOD BLOOD RIGHT HAND Performed at [place] [address], [place], [place] [address]    Special Requests   Final    BOTTLES DRAWN AEROBIC AND ANAEROBIC Blood Culture results may not be optimal due to an inadequate volume of blood received in culture bottles Performed at [facility] [address], [place], [place] [address]    Culture   Final    NO GROWTH 1 DAY Performed at [facility], [place] [address], [place], [place] [address]    Report Status PENDING  Incomplete  Blood Cultures (routine x 2)     Status: None (Preliminary  result)   Collection Time: 10/16/21  7:15 PM   Specimen: BLOOD  Result Value Ref Range Status   Specimen Description   Final    BLOOD LEFT ANTECUBITAL Performed at [facility] [address], [place], [place] [address]    Special Requests   Final    BOTTLES DRAWN AEROBIC AND ANAEROBIC Blood Culture results may not be optimal due to an inadequate volume of blood received in culture bottles Performed at [place] [address] [place],  [address]  Culture   Final    NO GROWTH 1 DAY Performed at [facility], [place] [address] [place], [place] [address]    Report Status PENDING  Incomplete  Urine Culture     Status: None   Collection Time: 10/16/21 10:36 PM   Specimen: Urine, Clean Catch  Result Value Ref Range Status   Specimen Description   Final    URINE, CLEAN CATCH Performed at [facility], [place] [address] [place], [place] [address]    Special Requests   Final    NONE Performed at [facility], [place] [address], [place], [place] [address]    Culture   Final    NO GROWTH Performed at [facility], [place] [address], [place], [place] [address]    Report Status 10/18/2021 FINAL  Final  MRSA Next Gen by PCR, Nasal     Status: None   Collection Time: 10/17/21  8:01 AM   Specimen: Nasal Mucosa; Nasal Swab  Result Value Ref Range Status   MRSA by PCR Next Gen NOT DETECTED NOT DETECTED Final    Comment: (NOTE) The GeneXpert MRSA Assay (FDA approved for NASAL specimens only), is one component of Crissy Mccreadie comprehensive MRSA colonization surveillance program. It is not intended to diagnose MRSA infection nor to guide or monitor treatment for MRSA infections. Test performance is not FDA approved in patients less than 76 years old. Performed at [facility], [place] [address] [place],  [address]          Radiology Studies: CT HEAD WO CONTRAST  Result Date:  10/16/2021 CLINICAL DATA:  Mental status change EXAM: CT HEAD WITHOUT CONTRAST TECHNIQUE: Contiguous axial images were obtained from the base of the skull through the vertex without intravenous contrast. RADIATION DOSE REDUCTION: This exam was performed according to the departmental dose-optimization program which includes automated exposure control, adjustment of the mA and/or kV according to patient size and/or use of iterative reconstruction technique. COMPARISON:  CT brain report 08/19/2009 FINDINGS: Brain: No evidence of acute infarction, hemorrhage, hydrocephalus, extra-axial collection or mass lesion/mass effect. Vascular: No hyperdense vessel or unexpected calcification. Skull: Normal. Negative for fracture or focal lesion. Sinuses/Orbits: No acute finding. Other: None IMPRESSION: Negative non contrasted CT appearance of the brain. Electronically Signed   By: Donavan Foil M.D.   On: 10/16/2021 20:40   DG Chest Port 1 View  Result Date: 10/16/2021 CLINICAL DATA:  Altered mental status. EXAM: PORTABLE CHEST 1 VIEW COMPARISON:  None. FINDINGS: Mild, stable right-sided volume loss is seen with mild, stable atelectatic changes noted within the right lung base. There is no evidence of an acute infiltrate, pleural effusion or pneumothorax. The heart size and mediastinal contours are within normal limits. The visualized skeletal structures are unremarkable. IMPRESSION: Stable right-sided volume loss with mild, stable right basilar atelectasis. Electronically Signed   By: Virgina Norfolk M.D.   On: 10/16/2021 19:38        Scheduled Meds:  Chlorhexidine Gluconate Cloth  6 each Topical Daily   lactulose  300 mL Rectal BID   mouth rinse  15 mL Mouth Rinse BID   thiamine injection  100 mg Intravenous Daily   Continuous Infusions:  dextrose 50 mL/hr at 10/18/21 0954     LOS: 1 day    Time spent: over 30 min    Fayrene Helper, MD [facility]   To contact the attending provider  between 7A-7P or the covering provider during after hours  7P-7A, please log into the web site [email or web address] and access using universal [place] password for that web site. If you do not have the password, please call the hospital operator.  10/18/2021, 10:10 AM

## 2021-10-18 NOTE — Assessment & Plan Note (Addendum)
2/2 cirrhosis LE edema improving Still with notable ascites - adjust diuretics as tolerated outpatient

## 2021-10-18 NOTE — Assessment & Plan Note (Addendum)
He names Patrick Tran as the person he'd like to help make decisions if he's unable Will see if chaplain around today to help with medical power of attorney paperwork, if not, will recommend this is done at his oncology follow up

## 2021-10-18 NOTE — Assessment & Plan Note (Addendum)
Follows with GI outpatient S/p banding on 2/3 of esophageal varices Bili ~7.1, relatively stable with recent labs Hold on beta blocker with hyponatremia.  Continue lasix and spironolactone. Follow up with gastroenterology outpatient, message sent

## 2021-10-18 NOTE — Assessment & Plan Note (Addendum)
Resolved Likely hepatic encephalopathy with improvement with lactulose - suspect this was precipitated by opiates with his partial response to narcan at presentation, covid may have precipitated as well (covid or other causes maybe contributing) Will discharge with lactulose TID with goal of 3 BM's daily, titrate for this Avoid narcotics at discharge, caution if initiating outpatient  No great options for covid treatment or convincing evidence treatment indicated (no hypoxia, LFT's >10x normal, precluding use of remdesivir) Follow VBG (pCO2 wnl), b12 (wnl), folate (wnl), TSH (wnl), urinalysis (UA wnl on 2/20 and UA on 2/22 nitrite positive - but asymptomatic and urine cx 2/20 ng), repeat chest x ray (left basilar opacities may represent airspace opacities or overlying cardiac shadow given pt positioning) S/p para not c/w sbp Head CT without acute abnormalities EEG with moderate diffuse encephalopathy, nonsepecific Narcan gtt d/c'd D/c ceftriaxone without evidence of SBP

## 2021-10-18 NOTE — Progress Notes (Signed)
Attempted EEG. Patient would not follow requests nor acknowledge I was speaking to him or in the room. Will attempt tomorrow if still needed.

## 2021-10-18 NOTE — Procedures (Signed)
PROCEDURE SUMMARY:  Successful US guided diagnostic and therapeutic  paracentesis from [facility].  Yielded 2 L of clear, yellow fluid.  No immediate complications.  Pt tolerated well.   Specimen were sent for labs.  EBL < 1 mL  Tyson Alias, AGNP 10/18/2021 3:36 PM

## 2021-10-18 NOTE — Telephone Encounter (Signed)
Linda Please cancel hospital EGD for this patient currently scheduled with [facility] Recall not needed at this time Thanks

## 2021-10-18 NOTE — Hospital Course (Addendum)
51 yo who came from [place] to [place] in 1992 (without children or blood relatives here in [place]), hx cirrhosis 2/2 hepatitis C, hepatocellular carcinoma, ascites who presented to the hospital with acute metabolic encephalopathy initially with concern for opiate overdose as well as incidentally covid positive.  He's now been found to have hepatic encephalopathy which has improved with lactulose.  Stable for discharge on 2/25.  See below for additional details

## 2021-10-18 NOTE — TOC Progression Note (Signed)
Transition of Care [facility]) - Progression Note    Patient Details  Name: Patrick Tran MRN: [id number] Date of Birth: Aug 03, 1971  Transition of [facility]) CM/SW Contact  Purcell Mouton, RN Phone Number: 10/18/2021, 9:44 AM  Clinical Narrative:     Transition of Care (TOC) Screening Note   Patient Details  Name: Patrick Tran Date of Birth: [birth year]   Transition of Care [facility]) CM/SW Contact:    Purcell Mouton, RN Phone Number: 10/18/2021, 9:44 AM    Transition of Care Department [facility]) has reviewed patient and no TOC needs have been identified at this time. We will continue to monitor patient advancement through interdisciplinary progression rounds. If new patient transition needs arise, please place a TOC consult.          Expected Discharge Plan and Services                                                 Social Determinants of Health (SDOH) Interventions    Readmission Risk Interventions No flowsheet data found.

## 2021-10-19 DIAGNOSIS — K7682 Hepatic encephalopathy: Principal | ICD-10-CM

## 2021-10-19 LAB — COMPREHENSIVE METABOLIC PANEL
ALT: 244 U/L — ABNORMAL HIGH (ref 0–44)
AST: 488 U/L — ABNORMAL HIGH (ref 15–41)
Albumin: <1.5 g/dL — ABNORMAL LOW (ref 3.5–5.0)
Alkaline Phosphatase: 191 U/L — ABNORMAL HIGH (ref 38–126)
Anion gap: 5 (ref 5–15)
BUN: 13 mg/dL (ref 6–20)
CO2: 24 mmol/L (ref 22–32)
Calcium: 7.8 mg/dL — ABNORMAL LOW (ref 8.9–10.3)
Chloride: 100 mmol/L (ref 98–111)
Creatinine, Ser: 0.45 mg/dL — ABNORMAL LOW (ref 0.61–1.24)
GFR, Estimated: >60 mL/min (ref >60)
Glucose, Bld: 94 mg/dL (ref 70–99)
Potassium: 3.5 mmol/L (ref 3.5–5.1)
Sodium: 129 mmol/L — ABNORMAL LOW (ref 135–145)
Total Bilirubin: 6.9 mg/dL — ABNORMAL HIGH (ref 0.3–1.2)
Total Protein: 6.9 g/dL (ref 6.5–8.1)

## 2021-10-19 LAB — CBC WITH DIFFERENTIAL/PLATELET
Abs Immature Granulocytes: 0.1 10*3/uL — ABNORMAL HIGH (ref 0.00–0.07)
Basophils Absolute: 0 10*3/uL (ref 0.0–0.1)
Basophils Relative: 1 %
Eosinophils Absolute: 0.1 10*3/uL (ref 0.0–0.5)
Eosinophils Relative: 1 %
HCT: 34.4 % — ABNORMAL LOW (ref 39.0–52.0)
Hemoglobin: 11.9 g/dL — ABNORMAL LOW (ref 13.0–17.0)
Immature Granulocytes: 2 %
Lymphocytes Relative: 28 %
Lymphs Abs: 1.7 10*3/uL (ref 0.7–4.0)
MCH: 34.3 pg — ABNORMAL HIGH (ref 26.0–34.0)
MCHC: 34.6 g/dL (ref 30.0–36.0)
MCV: 99.1 fL (ref 80.0–100.0)
Monocytes Absolute: 0.7 10*3/uL (ref 0.1–1.0)
Monocytes Relative: 12 %
Neutro Abs: 3.4 10*3/uL (ref 1.7–7.7)
Neutrophils Relative %: 56 %
Platelets: 122 10*3/uL — ABNORMAL LOW (ref 150–400)
RBC: 3.47 MIL/uL — ABNORMAL LOW (ref 4.22–5.81)
RDW: 18.2 % — ABNORMAL HIGH (ref 11.5–15.5)
WBC: 6 10*3/uL (ref 4.0–10.5)
nRBC: 0.3 % — ABNORMAL HIGH (ref 0.0–0.2)

## 2021-10-19 LAB — GLUCOSE, CAPILLARY
Glucose-Capillary: 78 mg/dL (ref 70–99)
Glucose-Capillary: 85 mg/dL (ref 70–99)
Glucose-Capillary: 116 mg/dL — ABNORMAL HIGH (ref 70–99)
Glucose-Capillary: 96 mg/dL (ref 70–99)
Glucose-Capillary: 113 mg/dL — ABNORMAL HIGH (ref 70–99)

## 2021-10-19 LAB — MAGNESIUM: Magnesium: 1.7 mg/dL (ref 1.7–2.4)

## 2021-10-19 LAB — PHOSPHORUS: Phosphorus: 3.5 mg/dL (ref 2.5–4.6)

## 2021-10-19 LAB — AMMONIA: Ammonia: 72 umol/L — ABNORMAL HIGH (ref 9–35)

## 2021-10-19 LAB — TSH: TSH: 3.876 u[IU]/mL (ref 0.350–4.500)

## 2021-10-19 MED ORDER — FUROSEMIDE 40 MG PO TABS
40.0000 mg | ORAL_TABLET | Freq: Every day | ORAL | Status: DC
  Administered 2021-10-19 – 2021-10-21 (×3): 40 mg via ORAL
  Filled 2021-10-19 (×3): qty 1

## 2021-10-19 MED ORDER — MORPHINE SULFATE (PF) 2 MG/ML IV SOLN
0.5000 mg | INTRAVENOUS | Status: DC | PRN
  Administered 2021-10-19 – 2021-10-20 (×3): 0.5 mg via INTRAVENOUS
  Filled 2021-10-19 (×3): qty 1

## 2021-10-19 MED ORDER — LACTULOSE 10 GM/15ML PO SOLN
30.0000 g | Freq: Three times a day (TID) | ORAL | Status: DC
  Administered 2021-10-19 – 2021-10-20 (×5): 30 g via ORAL
  Filled 2021-10-19: qty 45
  Filled 2021-10-19 (×4): qty 60

## 2021-10-19 NOTE — Progress Notes (Addendum)
PROGRESS NOTE    Patrick Tran  VCB:[id number] DOB: 1970-09-14 DOA: 10/16/2021 PCP: Patient, No Pcp Per (Inactive)  Chief Complaint  Patient presents with   Altered Mental Status    Brief Narrative:  51 yo who came from [place] to [place] in 1992 (without children or blood relatives here in [place]), hx cirrhosis 2/2 hepatitis C, hepatocellular carcinoma, ascites who presented to the hospital with acute metabolic encephalopathy initially with concern for opiate overdose as well as incidentally covid positive.    Assessment & Plan:   Principal Problem:   Acute hepatic encephalopathy Active Problems:   Ascites   COVID-19 virus infection   Hepatocellular carcinoma (HCC)   Hyperkalemia   Volume overload   Hepatic cirrhosis due to chronic hepatitis C infection (HCC)   Opiate use, concern for overdose   Hyponatremia   DNR (do not resuscitate)/DNI(Do Not Intubate)   Complex care coordination   Assessment and Plan: * Acute hepatic encephalopathy Much improved, he's Patrick Tran&Ox2 (self, hospital - not month or situation)  Likely hepatic encephalopathy with improvement with lactulose (covid or other causes maybe contributing) Lactulose TID PO if able to tolerate SLP eval No great options for covid treatment or convincing evidence treatment indicated (no hypoxia, LFT's >10x normal, precluding use of remdesivir) Follow VBG (pCO2 wnl), b12 (wnl), folate (wnl), TSH (wnl), urinalysis (UA wnl on 2/20 and UA on 2/22 nitrite positive), repeat chest x ray (left basilar opacities may represent airspace opacities or overlying cardiac shadow given pt positioning) S/p para not c/w sbp Head CT without acute abnormalities Will cancel EEG Narcan gtt d/c'd D/c ceftriaxone without evidence of SBP    COVID-19 virus infection On RA Possibly contributing to his encephalopathy No need for steroids as doing well on RA  Ascites- (present on admission) Ascites fluid not c/w SBP Cytology and culture  pending   Hepatocellular carcinoma ([place])- (present on admission) Started treatment recently with oncology Last saw Dr. Burr Medico 10/13/2021 Will continue to discuss his care with Dr. Burr Medico  Volume overload 2/2 cirrhosis Start lasix PO when able to tolerate PO (hopefully today) Hold spironolactone for now with recent hyperkalemia  Hyperkalemia Improved with lasix, continue PO  Hyponatremia 2/2 volume overload and D10W being administered Hold D10, diurese with PO lasix trend  Opiate use, concern for overdose Apparently initially responded to narcan and overdose could have been component of encephalopathy, but with continued obtundation on narcan gtt, suspect this was limited component and hepatic encephalopathy driving process Will d/c narcan gtt as above  Hepatic cirrhosis due to chronic hepatitis C infection ([place]) Follows with GI outpatient S/p banding on 2/3 of esophageal varices All oral meds on hold with encephalopathy Bili ~6.9, relatively stable with recent labs   DNR (do not resuscitate)/DNI(Do Not Intubate) Reviewed pt's DNR form that was his in folder that was brought with him.     Complex care coordination Appreciate Dr. Verlon Au and Dr. Kirstie Mirza input Now more alert and I suspect he'll be able to participate in decision making Will see if Dr. Burr Medico can help with care as well.    DVT prophylaxis: SCD Code Status: dnr Family Communication: none Disposition:   Status is: Inpatient Remains inpatient appropriate because: continued encephalopathy, need for continued workup   Consultants:  Palliative care IR Oncology  Procedures:  none  Antimicrobials:  Anti-infectives (From admission, onward)    Start     Dose/Rate Route Frequency Ordered Stop   10/18/21 1700  cefTRIAXone (ROCEPHIN) 2 g in sodium chloride 0.9 %  100 mL IVPB  Status:  Discontinued        2 g 200 mL/hr over 30 Minutes Intravenous Every 24 hours 10/18/21 1556 10/19/21 0722        [name] No new complaints, denies pain or discomfort.  Asking for blanket and toothbrush. Interpreter used. Friend at bedside as well.   Objective: Vitals:   10/19/21 0200 10/19/21 0300 10/19/21 0400 10/19/21 0630  BP: (!) 146/102  (!) 150/93 128/76  Pulse: 94 95 94   Resp: 11 14 12    Temp:   (!) 97.3 F (36.3 C) (!) 97.4 F (36.3 C)  TempSrc:   Axillary Oral  SpO2: 99% 99% 99%   Weight:      Height:        Intake/Output Summary (Last 24 hours) at 10/19/2021 0914 Last data filed at 10/19/2021 0400 Gross per 24 hour  Intake 1028.35 ml  Output 960 ml  Net 68.35 ml   Filed Weights   10/17/21 0800  Weight: 63.2 kg    Examination:  General: No acute distress. Cardiovascular: RRR Lungs: unlabored Abdomen: distended, nontender Neurological: Alert and oriented 2. Moves all extremities 4. Cranial nerves II through XII grossly intact. + asterixis Skin: Warm and dry. No rashes or lesions. Extremities: LE edema   Data Reviewed: I have personally reviewed following labs and imaging studies  CBC: Recent Labs  Lab 10/13/21 1139 10/16/21 1900 10/16/21 1951 10/17/21 0836 10/18/21 0311 10/19/21 0258  WBC 6.9 8.9  --  6.2 7.2 6.0  NEUTROABS 4.4 5.6  --  3.6 3.7 3.4  HGB 11.7* 11.4* 12.2* 9.9* 10.8* 11.9*  HCT 34.2* 33.8* 36.0* 28.7* 31.2* 34.4*  MCV 98.0 101.2*  --  100.3* 99.7 99.1  PLT 112* 146*  --  120* 125* 122*    Basic Metabolic Panel: Recent Labs  Lab 10/16/21 1910 10/16/21 1951 10/17/21 0107 10/17/21 0836 10/18/21 0311 10/18/21 2034 10/19/21 0258  NA  --    < > 131* 130*   131* 128* 128* 129*  K  --    < > 6.0* 5.3*   5.2* 4.2 5.5* 3.5  CL  --    < > 100 101   101 99 100 100  CO2  --   --  27 26   27 24 24 24   GLUCOSE  --    < > 63* 220*   200* 127* 92 94  BUN  --    < > 29* 26*   26* 16 12 13   CREATININE  --    < > 0.51* 0.64   0.68 0.45* <0.30* 0.45*  CALCIUM  --   --  8.2* 7.7*   7.9* 8.0* 7.7* 7.8*  MG 2.7*  --   --  2.2  --    --  1.7  PHOS  --   --   --   --   --   --  3.5   < > = values in this interval not displayed.    GFR: Estimated Creatinine Clearance: 98.2 mL/min (Jonnathan Birman) (by C-G formula based on SCr of 0.45 mg/dL (L)).  Liver Function Tests: Recent Labs  Lab 10/13/21 1139 10/16/21 1900 10/17/21 0836 10/18/21 0311 10/19/21 0258  AST 262* 583* 506* 540* 488*  ALT 136* 270* 237* 262* 244*  ALKPHOS 263* 246* 197* 196* 191*  BILITOT 6.0* 7.9* 5.8* 6.4* 6.9*  PROT 8.2* 7.8 6.5 6.8 6.9  ALBUMIN 2.1* 1.7* <1.5* <1.5* <1.5*    CBG: Recent  Labs  Lab 10/18/21 1149 10/18/21 1548 10/18/21 1950 10/19/21 0408 10/19/21 0856  GLUCAP 115* 88 97 78 85     Recent Results (from the past 240 hour(s))  Resp Panel by RT-PCR (Flu Desia Saban&B, Covid) Nasopharyngeal Swab     Status: Abnormal   Collection Time: 10/16/21  7:10 PM   Specimen: Nasopharyngeal Swab; Nasopharyngeal(NP) swabs in vial transport medium  Result Value Ref Range Status   SARS Coronavirus 2 by RT PCR POSITIVE (Mayte Diers) NEGATIVE Final    Comment: (NOTE) SARS-CoV-2 target nucleic acids are DETECTED.  The SARS-CoV-2 RNA is generally detectable in upper respiratory specimens during the acute phase of infection. Positive results are indicative of the presence of the identified virus, but do not rule out bacterial infection or co-infection with other pathogens not detected by the test. Clinical correlation with patient history and other diagnostic information is necessary to determine patient infection status. The expected result is Negative.  Fact Sheet for Patients: EntrepreneurPulse.com.au  Fact Sheet for Healthcare Providers: IncredibleEmployment.be  This test is not yet approved or cleared by the [place] FDA and  has been authorized for detection and/or diagnosis of SARS-CoV-2 by FDA under an Emergency Use Authorization (EUA).  This EUA will remain in effect (meaning this test can be used) for the  duration of  the COVID-19 declaration under Section 564(b)(1) of the Glorianne Proctor ct, 21 U.S.C. section 360bbb-3(b)(1), unless the authorization is terminated or revoked sooner.     Influenza Mar [place]mer by PCR NEGATIVE NEGATIVE Final   Influenza B by PCR NEGATIVE NEGATIVE Final    Comment: (NOTE) The Xpert Xpress SARS-CoV-2/FLU/RSV plus assay is intended as an aid in the diagnosis of influenza from Nasopharyngeal swab specimens and should not be used as Emina Ribaudo sole basis for treatment. Nasal washings and aspirates are unacceptable for Xpert Xpress SARS-CoV-2/FLU/RSV testing.  Fact Sheet for Patients: EntrepreneurPulse.com.au  Fact Sheet for Healthcare Providers: IncredibleEmployment.be  This test is not yet approved or cleared by the [place] FDA and has been authorized for detection and/or diagnosis of SARS-CoV-2 by FDA under an Emergency Use Authorization (EUA). This EUA will remain in effect (meaning this test can be used) for the duration of the COVID-19 declaration under Section 564(b)(1) of the Act, 21 U.S.C. section 360bbb-3(b)(1), unless the authorization is terminated or revoked.  Performed at [facility], [place] [address], [place], [place] [address]   Blood Cultures (routine x 2)     Status: None (Preliminary result)   Collection Time: 10/16/21  7:10 PM   Specimen: BLOOD  Result Value Ref Range Status   Specimen Description   Final    BLOOD BLOOD RIGHT HAND Performed at [facility] [address], [place], [place] [address]    Special Requests   Final    BOTTLES DRAWN AEROBIC AND ANAEROBIC Blood Culture results may not be optimal due to an inadequate volume of blood received in culture bottles Performed at [facility] [address] [place], [place] [address]    Culture   Final    NO GROWTH 2 DAYS Performed at [facility] [address] [place] [address] [place], [place] [address]    Report  Status PENDING  Incomplete  Blood Cultures (routine x 2)     Status: None (Preliminary result)   Collection Time: 10/16/21  7:15 PM   Specimen: BLOOD  Result Value Ref Range Status   Specimen Description   Final    BLOOD LEFT ANTECUBITAL Performed at  [facility], [place] [address], [place], [place] [address]    Special Requests   Final    BOTTLES DRAWN AEROBIC AND ANAEROBIC Blood Culture results may not be optimal due to an inadequate volume of blood received in culture bottles Performed at [facility] [address], [place], [place] [address]    Culture   Final    NO GROWTH 2 DAYS Performed at [facility] [address], [place], [place] [address]    Report Status PENDING  Incomplete  Urine Culture     Status: None   Collection Time: 10/16/21 10:36 PM   Specimen: Urine, Clean Catch  Result Value Ref Range Status   Specimen Description   Final    URINE, CLEAN CATCH Performed at [facility], [place] [address] [place], [place] [address]    Special Requests   Final    NONE Performed at [facility], [place] [address], [place], [place] [address]    Culture   Final    NO GROWTH Performed at [facility], [place] [address], [place], [place] [address]    Report Status 10/18/2021 FINAL  Final  MRSA Next Gen by PCR, Nasal     Status: None   Collection Time: 10/17/21  8:01 AM   Specimen: Nasal Mucosa; Nasal Swab  Result Value Ref Range Status   MRSA by PCR Next Gen NOT DETECTED NOT DETECTED Final    Comment: (NOTE) The GeneXpert MRSA Assay (FDA approved for NASAL specimens only), is one component of Reyden Smith comprehensive MRSA colonization surveillance program. It is not intended to diagnose MRSA infection nor to guide or monitor treatment for MRSA infections. Test performance is not FDA approved in patients less than 1 years old. Performed at [facility], [place] [address], [place], [place] [address]   Body fluid culture w Gram Stain     Status: None (Preliminary result)   Collection Time: 10/18/21  3:48 PM   Specimen: PATH Cytology Peritoneal fluid  Result Value Ref Range Status   Specimen Description   Final    PERITONEAL Performed at [facility] [address] [place], [place] [address]    Special Requests   Final    NONE Performed at [facility], [place] [address] [place], [place] [address]    Gram Stain   Final    NO SQUAMOUS EPITHELIAL CELLS SEEN FEW WBC SEEN NO ORGANISMS SEEN Performed at [facility], [place] [address] [place],  [address]    Culture PENDING  Incomplete   Report Status PENDING  Incomplete         Radiology Studies: US Paracentesis  Result Date: 10/18/2021 INDICATION: Patient with history of hepatocellular carcinoma with recurrent ascites. Request for IR to perform diagnostic and therapeutic paracentesis with Rosalio Catterton 2 L max. EXAM: ULTRASOUND GUIDED DIAGNOSTIC AND THERAPEUTIC LEFT LOWER QUADRANT PARACENTESIS MEDICATIONS: 10 mL 1 % lidocaine COMPLICATIONS: None immediate. PROCEDURE: Informed written consent was obtained from the patient after Ernest Popowski discussion of the risks, benefits and alternatives to treatment. Inioluwa Boulay timeout was performed prior to the initiation of the procedure. Initial ultrasound scanning demonstrates Florene Brill large amount of ascites within the left lower abdominal quadrant. The left lower abdomen was prepped and draped in the usual sterile fashion. 1% lidocaine was used for local anesthesia. Following this, Ketrina Boateng 19 gauge, 7-cm, Yueh catheter was introduced. An ultrasound image was saved for documentation purposes. The  paracentesis was performed. The catheter was removed and Jessel Gettinger dressing was applied. The patient tolerated the procedure well without immediate post procedural complication. FINDINGS: Mauri Temkin total of approximately 2 L of clear, yellow fluid was removed. Samples were sent to the  laboratory as requested by the clinical team. IMPRESSION: Successful ultrasound-guided paracentesis yielding 2 liters of peritoneal fluid. Read by: Narda Rutherford, AGNP-BC Electronically Signed   By: Corrie Mckusick D.O.   On: 10/18/2021 16:45   DG CHEST PORT 1 VIEW  Result Date: 10/18/2021 CLINICAL DATA:  Altered mental status with acute metabolic encephalopathy. EXAM: PORTABLE CHEST 1 VIEW COMPARISON:  Chest radiograph dated 10/16/2021. FINDINGS: The patient is rotated to the left which limits the sensitivity of the exam. Left basilar opacities may represent airspace opacities or overlying cardiac shadow. The right lung is clear. There is no pneumothorax or pleural effusion. The osseous structures appear intact. IMPRESSION: Left basilar opacities may represent airspace opacities or overlying cardiac shadow given patient positioning. Electronically Signed   By: Zerita Boers M.D.   On: 10/18/2021 10:21        Scheduled Meds:  Chlorhexidine Gluconate Cloth  6 each Topical Daily   furosemide  40 mg Oral Daily   lactulose  30 g Oral TID   mouth rinse  15 mL Mouth Rinse BID   thiamine injection  100 mg Intravenous Daily   Continuous Infusions:  dextrose 30 mL/hr at 10/18/21 1846     LOS: 2 days    Time spent: over 30 min    Fayrene Helper, MD [facility]   To contact the attending provider between 7A-7P or the covering provider during after hours 7P-7A, please log into the web site [email or web address] and access using universal [place] password for that web site. If you do not have the password, please call the hospital operator.  10/19/2021, 9:14 AM

## 2021-10-19 NOTE — Progress Notes (Signed)
Daily Progress Note   Patient Name: Lennie Vasco       Date: 10/19/2021 DOB: 05-12-1971  Age: 51 y.o. MRN#: [id number] Attending Physician: Elodia Florence., * Primary Care Physician: Patient, No Pcp Per (Inactive) Admit Date: 10/16/2021  Reason for Consultation/Follow-up: Establishing goals of care  Subjective: Discussed case with Dr. Florene Glen.  Mr. Donaway is more awake and interactive today.  Discussed plan to continue current care.    I saw and examined Mr. Burkard this morning.  He was awake and lying in bed in no distress.  He opened his eyes and said a few words but he was very quiet in speech.  I attempted to use translator to speak with him but translator was unable to hear what he was whispering and he fell asleep during encounter.  We did briefly talk about 1 major need being to determine who his surrogate would be if he cannot make his own medical decisions.  I asked for translator to ask him to think about this.  I am not sure if he really understood this, however, as he was sleepy at that point.  As he was much more awake and interactive earlier today, I told him I would come back tomorrow to check in and we will see if he is more able to participate in conversation at that time.  Length of Stay: 2  Current Medications: Scheduled Meds:   Chlorhexidine Gluconate Cloth  6 each Topical Daily   furosemide  40 mg Oral Daily   lactulose  30 g Oral TID   mouth rinse  15 mL Mouth Rinse BID   thiamine injection  100 mg Intravenous Daily    Continuous Infusions:  dextrose 30 mL/hr at 10/18/21 1846    PRN Meds: ondansetron (ZOFRAN) IV  Physical Exam       General: Alert but weak and tired, does acknowledge and speak with me today, but it is difficult to  see or exactly what he is saying.  He was noted to be more awake with stronger voice earlier today. Heart: Regular rate and rhythm. Lungs: unlabored Ext: No significant edema  Vital Signs: BP (!) 150/99    Pulse 89    Temp (!) 97.4 F (36.3 C) (Oral)    Resp 18    Ht 5\' 5"  (1.651  m)    Wt 63.2 kg    SpO2 100%    BMI 23.19 kg/m  SpO2: SpO2: 100 % O2 Device: O2 Device: Room Air O2 Flow Rate:    Intake/output summary:  Intake/Output Summary (Last 24 hours) at 10/19/2021 1105 Last data filed at 10/19/2021 0900 Gross per 24 hour  Intake 1028.35 ml  Output 990 ml  Net 38.35 ml    LBM: Last BM Date : 10/19/21 Baseline Weight: Weight: 63.2 kg Most recent weight: Weight: 63.2 kg       Palliative Assessment/Data:      Patient Active Problem List   Diagnosis Date Noted   Acute hepatic encephalopathy 10/18/2021   Volume overload 10/18/2021   Complex care coordination 10/18/2021   Hyponatremia 10/18/2021   Hyperkalemia 10/17/2021   Opiate use, concern for overdose 10/17/2021   COVID-19 virus infection 10/17/2021   DNR (do not resuscitate)/DNI(Do Not Intubate) 10/17/2021   Hepatocellular carcinoma ([place])    Hepatic cirrhosis due to chronic hepatitis C infection ([place]) 09/08/2021   Ascites 09/08/2021   Abdominal pain 09/08/2021   Liver lesion 09/08/2021   Pancytopenia ([place]) 09/08/2021   DENTAL CARIES 11/25/2007    Palliative Care Assessment & Plan   Patient Profile: 51 y.o. male  with past medical history of hepatitis C, liver cirrhosis, ascites and recent diagnosis of liver cancer with esophageal varices and gastritis admitted on 10/16/2021 with altered mental status.  He was noted to have recently started on morphine for pain management and on arrival to ED was unresponsive.  He is documented to have had some response to Narcan infusion in ED, however, he remains very confused on the floor and is not able to participate in a conversation regarding goals of care.  He  is also COVID-positive.  Recommendations/Plan: - DNR- This was established at outpatient appointment on 10/13/2021 - Encephalopathy appears improved and he has been speaking with providers today.  At the time of my encounter, however, he was very weak and fell asleep and the translator was not able to understand him well enough to fully engage. - We will plan to follow-up again tomorrow.  He is improving clinically and plan remains to continue with current care with established limit of DNR.  My main goal in continuing to engage with him is to help determine who would be most appropriate surrogate decision maker to act on his behalf if he reaches a point he cannot make his own decisions again.  Code Status:    Code Status Orders  (From admission, onward)           Start     Ordered   10/17/21 0802  Do not attempt resuscitation (DNR)  Continuous       Question Answer Comment  In the event of cardiac or respiratory ARREST Do not call a code blue   In the event of cardiac or respiratory ARREST Do not perform Intubation, CPR, defibrillation or ACLS   In the event of cardiac or respiratory ARREST Use medication by any route, position, wound care, and other measures to relive pain and suffering. May use oxygen, suction and manual treatment of airway obstruction as needed for comfort.   Comments verified DNR form      10/17/21 0801           Code Status History     Date Active Date Inactive Code Status Order ID Comments User Context   09/08/2021 0353 09/10/2021 2232 Full Code [id number]  Howerter, Ethelda Chick,  DO ED       Prognosis:  Unable to determine  Discharge Planning: To Be Determined  Care plan was discussed with Dr. Florene Glen  Thank you for allowing the Palliative Medicine Team to assist in the care of this patient.  Micheline Rough, MD  Please contact Palliative Medicine Team phone at [phone] for questions and concerns.

## 2021-10-19 NOTE — Progress Notes (Signed)
Patient reports urinating several times since Foley catheter was removed, when having a BM.  RN unable to distinguish urine from stool.  Patient resting in bed.  Denies pain, nausea.  Dinner at bedside.  Cardiac monitoring begun. Patient oriented to unit.  Angie Fava, RN

## 2021-10-19 NOTE — Progress Notes (Signed)
Pt bladder scanned per order. Showed 30 mL in bladder. Pt states he has been urinating with bowel movements. MD notified.

## 2021-10-19 NOTE — Progress Notes (Signed)
EEG complete - results pending 

## 2021-10-19 NOTE — Telephone Encounter (Signed)
Pt's procedure cancelled.  

## 2021-10-19 NOTE — Evaluation (Signed)
Clinical/Bedside Swallow Evaluation Patient Details  Name: Patrick Tran MRN: [id number] Date of Birth: [birth year]  Today's Date: 10/19/2021 Time: SLP Start Time (ACUTE ONLY): 1126 SLP Stop Time (ACUTE ONLY): 1157 SLP Time Calculation (min) (ACUTE ONLY): 31 min  Past Medical History:  Past Medical History:  Diagnosis Date   CAP (community acquired pneumonia)    Cirrhosis ([place])    Hepatitis C virus infection    Hepatocellular carcinoma ([place])    Past Surgical History:  Past Surgical History:  Procedure Laterality Date   BIOPSY  09/29/2021   Procedure: BIOPSY;  Surgeon: Irving Copas., MD;  Location: [facility];  Service: Gastroenterology;;   ESOPHAGEAL BANDING  09/29/2021   Procedure: ESOPHAGEAL BANDING;  Surgeon: Irving Copas., MD;  Location: [facility];  Service: Gastroenterology;;   ESOPHAGOGASTRODUODENOSCOPY (EGD) WITH PROPOFOL N/A 09/29/2021   Procedure: ESOPHAGOGASTRODUODENOSCOPY (EGD) WITH PROPOFOL;  Surgeon: Irving Copas., MD;  Location: [facility];  Service: Gastroenterology;  Laterality: N/A;   IR PARACENTESIS  09/08/2021   IR PARACENTESIS  10/04/2021   HPI:  51 yo hepatocellular carcinoma with recurrent ascites.  s/p paracentesis, pt no cooperative with EKG, etc   Per notes, encephalopathic, CXR 2/22 Left basilar opacities may represent airspace opacities or overlying cardiac shadow given patient positioning. Brain CT 2/22 negative   Pt is receiving lactulose enemas - rn desired pt to take po if tolerated.  Swallow eval ordered.    Assessment / Plan / Recommendation  Clinical Impression  Patient awake in bed, repositioned himself and wiling to participate in evaluation.  SlP used interpreter Ange [id number] via [place] during entire session.  Pt is xerostomic with erythemic oral cavity.  After oral care, pt presented with normal oropharyngeal swallow ability based on clinical swallow evaluation. No CN deficits apparent - Pt observed with consumption  of crackers, applesauce, juice and water. He was mildly weak with self feeding but no indication of aspiration with all po.  Recommend regular/thin diet depending on MD desire due to pt's asctites, etc- no SLP follow up indicated. Advised pt to eat slowly to assure tolerance. Thanks. SLP Visit Diagnosis: Dysphagia, unspecified (R13.10)    Aspiration Risk  No limitations    Diet Recommendation Regular;Thin liquid   Liquid Administration via: Cup;Straw Medication Administration: Whole meds with liquid Supervision: Patient able to self feed Compensations: Slow rate;Small sips/bites Postural Changes: Seated upright at 90 degrees;Remain upright for at least 30 minutes after po intake    Other  Recommendations Oral Care Recommendations: Oral care BID    Recommendations for follow up therapy are one component of a multi-disciplinary discharge planning process, led by the attending physician.  Recommendations may be updated based on patient status, additional functional criteria and insurance authorization.  Follow up Recommendations No SLP follow up      Assistance Recommended at Discharge None  Functional Status Assessment Patient has not had a recent decline in their functional status  Frequency and Duration     N/a       Prognosis   N/a     Swallow Study   General Date of Onset: 10/19/21 HPI: 51 yo hepatocellular carcinoma with recurrent ascites.  s/p paracentesis, pt no cooperative with EKG, etc   Per notes, encephalopathic, CXR 2/22 Left basilar opacities may represent airspace opacities or overlying cardiac shadow given patient positioning. Brain CT 2/22 negative   Pt is receiving lactulose enemas - rn desired pt to take po if tolerated.  Swallow eval ordered. Type of Study: Bedside Swallow  Evaluation Diet Prior to this Study: NPO Temperature Spikes Noted: No Respiratory Status: Room air History of Recent Intubation: No Behavior/Cognition: Alert;Cooperative;Pleasant mood Oral  Cavity Assessment: Dry;Erythema Oral Care Completed by SLP: Yes Oral Cavity - Dentition: Adequate natural dentition Vision: Functional for self-feeding Self-Feeding Abilities: Able to feed self Patient Positioning: Upright in bed Baseline Vocal Quality: Normal Volitional Cough: Strong Volitional Swallow: Able to elicit    Oral/Motor/Sensory Function Overall Oral Motor/Sensory Function: Within functional limits   Ice Chips Ice chips: Within functional limits Presentation: Spoon   Thin Liquid Thin Liquid: Within functional limits Presentation: Cup;Straw;Self Fed    Nectar Thick Nectar Thick Liquid: Not tested   Honey Thick Honey Thick Liquid: Not tested   Puree Puree: Within functional limits Presentation: Self Fed;Spoon   Solid     Solid: Within functional limits Presentation: Self Fredirick Lathe 10/19/2021,12:31 PM  Kathleen Lime, MS [facility] SLP [place] Office [phone] Pager [phone]

## 2021-10-19 NOTE — Progress Notes (Signed)
Patient voided a small unmeasurable amount of clear amber urine.  Angie Fava, RN

## 2021-10-20 ENCOUNTER — Inpatient Hospital Stay (HOSPITAL_COMMUNITY): Payer: BC Managed Care – PPO

## 2021-10-20 LAB — CBC WITH DIFFERENTIAL/PLATELET
Abs Immature Granulocytes: 0.23 10*3/uL — ABNORMAL HIGH (ref 0.00–0.07)
Basophils Absolute: 0.1 10*3/uL (ref 0.0–0.1)
Basophils Relative: 1 %
Eosinophils Absolute: 0.1 10*3/uL (ref 0.0–0.5)
Eosinophils Relative: 2 %
HCT: 34.1 % — ABNORMAL LOW (ref 39.0–52.0)
Hemoglobin: 12.3 g/dL — ABNORMAL LOW (ref 13.0–17.0)
Immature Granulocytes: 3 %
Lymphocytes Relative: 32 %
Lymphs Abs: 2.4 10*3/uL (ref 0.7–4.0)
MCH: 34.9 pg — ABNORMAL HIGH (ref 26.0–34.0)
MCHC: 36.1 g/dL — ABNORMAL HIGH (ref 30.0–36.0)
MCV: 96.9 fL (ref 80.0–100.0)
Monocytes Absolute: 0.9 10*3/uL (ref 0.1–1.0)
Monocytes Relative: 13 %
Neutro Abs: 3.7 10*3/uL (ref 1.7–7.7)
Neutrophils Relative %: 49 %
Platelets: 128 10*3/uL — ABNORMAL LOW (ref 150–400)
RBC: 3.52 MIL/uL — ABNORMAL LOW (ref 4.22–5.81)
RDW: 18.4 % — ABNORMAL HIGH (ref 11.5–15.5)
WBC: 7.4 10*3/uL (ref 4.0–10.5)
nRBC: 0 % (ref 0.0–0.2)

## 2021-10-20 LAB — COMPREHENSIVE METABOLIC PANEL
ALT: 219 U/L — ABNORMAL HIGH (ref 0–44)
AST: 434 U/L — ABNORMAL HIGH (ref 15–41)
Albumin: <1.5 g/dL — ABNORMAL LOW (ref 3.5–5.0)
Alkaline Phosphatase: 193 U/L — ABNORMAL HIGH (ref 38–126)
Anion gap: 8 (ref 5–15)
BUN: 19 mg/dL (ref 6–20)
CO2: 21 mmol/L — ABNORMAL LOW (ref 22–32)
Calcium: 7.8 mg/dL — ABNORMAL LOW (ref 8.9–10.3)
Chloride: 102 mmol/L (ref 98–111)
Creatinine, Ser: 0.66 mg/dL (ref 0.61–1.24)
GFR, Estimated: >60 mL/min (ref >60)
Glucose, Bld: 111 mg/dL — ABNORMAL HIGH (ref 70–99)
Potassium: 3.4 mmol/L — ABNORMAL LOW (ref 3.5–5.1)
Sodium: 131 mmol/L — ABNORMAL LOW (ref 135–145)
Total Bilirubin: 7.4 mg/dL — ABNORMAL HIGH (ref 0.3–1.2)
Total Protein: 7.1 g/dL (ref 6.5–8.1)

## 2021-10-20 LAB — BODY FLUID CELL COUNT WITH DIFFERENTIAL
Eos, Fluid: 1 %
Lymphs, Fluid: 29 %
Monocyte-Macrophage-Serous Fluid: 43 % — ABNORMAL LOW (ref 50–90)
Neutrophil Count, Fluid: 27 % — ABNORMAL HIGH (ref 0–25)
Total Nucleated Cell Count, Fluid: 174 cu mm (ref 0–1000)

## 2021-10-20 LAB — GLUCOSE, CAPILLARY
Glucose-Capillary: 105 mg/dL — ABNORMAL HIGH (ref 70–99)
Glucose-Capillary: 110 mg/dL — ABNORMAL HIGH (ref 70–99)
Glucose-Capillary: 113 mg/dL — ABNORMAL HIGH (ref 70–99)
Glucose-Capillary: 170 mg/dL — ABNORMAL HIGH (ref 70–99)
Glucose-Capillary: 80 mg/dL (ref 70–99)
Glucose-Capillary: 92 mg/dL (ref 70–99)
Glucose-Capillary: 97 mg/dL (ref 70–99)

## 2021-10-20 LAB — PHOSPHORUS: Phosphorus: 3.9 mg/dL (ref 2.5–4.6)

## 2021-10-20 LAB — AMMONIA: Ammonia: 35 umol/L (ref 9–35)

## 2021-10-20 LAB — MAGNESIUM: Magnesium: 1.8 mg/dL (ref 1.7–2.4)

## 2021-10-20 LAB — CYTOLOGY - NON PAP

## 2021-10-20 MED ORDER — SPIRONOLACTONE 25 MG PO TABS
100.0000 mg | ORAL_TABLET | Freq: Every day | ORAL | Status: DC
  Administered 2021-10-21: 100 mg via ORAL
  Filled 2021-10-20: qty 4

## 2021-10-20 MED ORDER — OXYCODONE HCL 5 MG PO TABS
2.5000 mg | ORAL_TABLET | Freq: Four times a day (QID) | ORAL | Status: DC | PRN
  Administered 2021-10-20 – 2021-10-21 (×2): 2.5 mg via ORAL
  Filled 2021-10-20 (×3): qty 1

## 2021-10-20 MED ORDER — LIDOCAINE HCL 1 % IJ SOLN
INTRAMUSCULAR | Status: AC
  Filled 2021-10-20: qty 20

## 2021-10-20 MED ORDER — SPIRONOLACTONE 25 MG PO TABS
50.0000 mg | ORAL_TABLET | Freq: Once | ORAL | Status: AC
  Administered 2021-10-20: 50 mg via ORAL
  Filled 2021-10-20: qty 2

## 2021-10-20 MED ORDER — SPIRONOLACTONE 25 MG PO TABS
50.0000 mg | ORAL_TABLET | Freq: Every day | ORAL | Status: DC
  Administered 2021-10-20: 50 mg via ORAL
  Filled 2021-10-20: qty 2

## 2021-10-20 NOTE — Procedures (Signed)
Patient Name: Patrick Tran  MRN: [id number]  Epilepsy Attending: Lora Havens  Referring Physician/Provider: Elodia Florence., MD Date: 10/19/2021  Duration: 22.14 mins  Patient history: 51 year old male with altered mental status.  EEG to evaluate for seizure.  Level of alertness: Awake  AEDs during EEG study: None  Technical aspects: This EEG study was done with scalp electrodes positioned according to the 10-20 International system of electrode placement. Electrical activity was acquired at a sampling rate of 500Hz  and reviewed with a high frequency filter of 70Hz  and a low frequency filter of 1Hz . EEG data were recorded continuously and digitally stored.   Description: No clear posterior dominant rhythm was seen.  EEG showed continuous generalized 3 to 5 Hz theta- delta slowing.  Intermittent generalized triphasic waves were noted.  Hyperventilation and photic stimulation were not performed.     ABNORMALITY - Continuous slow, generalized -Triphasic wave, generalized  IMPRESSION: This study is suggestive of moderate diffuse encephalopathy, nonspecific etiology but most likely secondary to toxic-metabolic causes.  No seizures or definite epileptiform discharges were seen throughout the recording.  Patrick Tran [name]

## 2021-10-20 NOTE — Procedures (Signed)
Ultrasound-guided therapeutic paracentesis performed yielding 4.8 liters of straw colored fluid.  No immediate complications. EBL is none.

## 2021-10-20 NOTE — Progress Notes (Signed)
PROGRESS NOTE    Patrick Tran  OFB:[id number] DOB: [birth year] DOA: 10/16/2021 PCP: Patient, No Pcp Per (Inactive)  Chief Complaint  Patient presents with   Altered Mental Status    Brief Narrative:  51 yo who came from [place] to [place] in 1992 (without children or blood relatives here in [place]), hx cirrhosis 2/2 hepatitis C, hepatocellular carcinoma, ascites who presented to the hospital with acute metabolic encephalopathy initially with concern for opiate overdose as well as incidentally covid positive.  He's now been found to have hepatic encephalopathy which has improved with lactulose.  Consider discharge 2/25.  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[place])   Opiate use, concern for overdose   Hyponatremia   DNR (do not resuscitate)/DNI(Do Not Intubate)   Complex care coordination   Hyperkalemia   Assessment and Plan: * Acute hepatic encephalopathy Marked improvement up until this point Likely hepatic encephalopathy with improvement with lactulose - suspect this was precipitated by opiates with his partial response to narcan at presentation, covid may have precipitated as well (covid or other causes maybe contributing) Lactulose TID PO if able to tolerate, goal 3 BM's daily No great options for covid treatment or convincing evidence treatment indicated (no hypoxia, LFT's >10x normal, precluding use of remdesivir) Follow VBG (pCO2 wnl), b12 (wnl), folate (wnl), TSH (wnl), urinalysis (UA wnl on 2/20 and UA on 2/22 nitrite positive), repeat chest x ray (left basilar opacities may represent airspace opacities or overlying cardiac shadow given pt positioning) S/p para not c/w sbp Head CT without acute abnormalities EEG with moderate diffuse  encephalopathy, nonsepecific Narcan gtt d/c'd D/c ceftriaxone without evidence of SBP    COVID-19 virus infection On RA Possibly contributing to his encephalopathy No need for steroids as doing well on RA  Ascites- (present on admission) Ascites fluid not c/w SBP Cytology (pending) and culture NGx2 Therapeutic paracentesis with 4.8 L off today Follow repeat cell count and culture   Hepatocellular carcinoma ([place])- (present on admission) Started treatment recently with oncology Last saw Dr. Burr Medico 10/13/2021 Will continue to discuss his care with Dr. Burr Medico as needed  Volume overload 2/2 cirrhosis Continue lasix.  Add spironolactone with tense ascites. LE edema seems to be improving.  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     Complex care coordination Appreciate Dr. Verlon Au and Dr. Kirstie Mirza input Now more alert and I suspect he'll be able to participate in decision making Will see if Dr. Burr Medico can help with care as well.   Hyperkalemia resolved   DVT prophylaxis: SCD Code Status: dnr Family Communication: none Disposition:   Status is: Inpatient Remains inpatient appropriate because: continued encephalopathy, need for continued workup  Consultants:  Palliative care IR Oncology  Procedures:   none  Antimicrobials:  Anti-infectives (From admission, onward)    Start     Dose/Rate Route Frequency Ordered Stop   10/18/21 1700  cefTRIAXone (ROCEPHIN) 2 g in sodium chloride 0.9 % 100 mL IVPB  Status:  Discontinued        2 g 200 mL/hr over 30 Minutes Intravenous Every 24 hours 10/18/21 1556 10/19/21 0722       Subjective: C/o abdominal discomfort with ascites Interpreter used  Objective: Vitals:   10/20/21 1233 10/20/21 1527 10/20/21 1619 10/20/21 1644  BP: (!) 159/103 (!) 158/99 (!) 139/96 (!) 153/89  Pulse:    (!) 111  Resp:    20  Temp:    97.7 F (36.5 C)  TempSrc:    Oral  SpO2:    99%  Weight:      Height:        Intake/Output Summary (Last 24 hours) at 10/20/2021 1712 Last data filed at 10/20/2021 1600 Gross per 24 hour  Intake 1800 ml  Output 300 ml  Net 1500 ml   Filed Weights   10/17/21 0800 10/20/21 0438  Weight: 63.2 kg 63.9 kg    Examination:  General: No acute distress. Cardiovascular: RRR Lungs: unlabored Abdomen: tense ascites Neurological: Alert and oriented 3. Moves all extremities 4. Cranial nerves II through XII grossly intact. Skin: Warm and dry. No rashes or lesions. Extremities: improving LE edema   Data Reviewed: I have personally reviewed following labs and imaging studies  CBC: Recent Labs  Lab 10/16/21 1900 10/16/21 1951 10/17/21 0836 10/18/21 0311 10/19/21 0258 10/20/21 0344  WBC 8.9  --  6.2 7.2 6.0 7.4  NEUTROABS 5.6  --  3.6 3.7 3.4 3.7  HGB 11.4* 12.2* 9.9* 10.8* 11.9* 12.3*  HCT 33.8* 36.0* 28.7* 31.2* 34.4* 34.1*  MCV 101.2*  --  100.3* 99.7 99.1 96.9  PLT 146*  --  120* 125* 122* 128*    Basic Metabolic Panel: Recent Labs  Lab 10/16/21 1910 10/16/21 1951 10/17/21 0836 10/18/21 0311 10/18/21 2034 10/19/21 0258 10/20/21 0344  NA  --    < > 130*   131* 128* 128* 129* 131*  K  --    < > 5.3*   5.2* 4.2 5.5* 3.5 3.4*  CL  --    < > 101   101 99 100 100 102  CO2  --    < > 26   27 24 24 24  21*   GLUCOSE  --    < > 220*   200* 127* 92 94 111*  BUN  --    < > 26*   26* 16 12 13 19   CREATININE  --    < > 0.64   0.68 0.45* <0.30* 0.45* 0.66  CALCIUM  --    < > 7.7*   7.9* 8.0* 7.7* 7.8* 7.8*  MG 2.7*  --  2.2  --   --  1.7 1.8  PHOS  --   --   --   --   --  3.5 3.9   < > = values in this interval not displayed.    GFR: Estimated Creatinine Clearance: 98.2 mL/min (by C-G formula based on SCr of 0.66 mg/dL).  Liver Function Tests: Recent Labs  Lab 10/16/21 1900 10/17/21 0836 10/18/21 0311 10/19/21 0258 10/20/21 0344  AST 583* 506* 540* 488* 434*  ALT 270* 237* 262* 244* 219*  ALKPHOS 246* 197* 196* 191* 193*  BILITOT 7.9* 5.8* 6.4* 6.9* 7.4*  PROT 7.8 6.5 6.8 6.9 7.1  ALBUMIN 1.7* <1.5* <1.5* <1.5* <1.5*    CBG: Recent Labs  Lab 10/20/21 0005 10/20/21 0355 10/20/21 0807 10/20/21 1232 10/20/21 1641  GLUCAP 105* 110* 80 113* 170*     Recent Results (from the past 240 hour(s))  Resp Panel by RT-PCR (Flu Patrick Tran&B, Covid) Nasopharyngeal Swab     Status: Abnormal   Collection Time: 10/16/21  7:10 PM   Specimen: Nasopharyngeal Swab; Nasopharyngeal(NP) swabs in vial transport medium  Result Value Ref Range Status   SARS Coronavirus 2 by RT PCR POSITIVE (Patrick Tran) NEGATIVE Final    Comment: (NOTE) SARS-CoV-2 target nucleic acids are DETECTED.  The SARS-CoV-2 RNA is generally detectable in upper respiratory specimens during the acute phase of infection. Positive results are indicative of the presence of the identified virus, but do not rule out bacterial infection or co-infection with other pathogens not detected by the test. Clinical correlation with patient history and other diagnostic information is necessary to determine patient infection status. The expected result is Negative.  Fact Sheet for Patients: Patrick Tran  Fact Sheet for Healthcare Providers: Patrick Tran  This test is not yet approved or cleared  by the [place] FDA and  has been authorized for detection and/or diagnosis of SARS-CoV-2 by FDA under an Emergency Use Authorization (EUA).  This EUA will remain in effect (meaning this test can be used) for the duration of  the COVID-19 declaration under Section 564(b)(1) of the [place] ct, 21 U.S.C. section 360bbb-3(b)(1), unless the authorization is terminated or revoked sooner.     Influenza Patrick Tran by PCR NEGATIVE NEGATIVE Final   Influenza B by PCR NEGATIVE NEGATIVE Final    Comment: (NOTE) The Xpert Xpress SARS-CoV-2/FLU/RSV plus assay is intended as an aid in the diagnosis of influenza from Nasopharyngeal swab specimens and should not be used as Aliviyah Malanga sole basis for treatment. Nasal washings and aspirates are unacceptable for Xpert Xpress SARS-CoV-2/FLU/RSV testing.  Fact Sheet for Patients: Patrick Tran  Fact Sheet for Healthcare Providers: Patrick Tran  This test is not yet approved or cleared by the [place] FDA and has been authorized for detection and/or diagnosis of SARS-CoV-2 by FDA under an Emergency Use Authorization (EUA). This EUA will remain in effect (meaning this test can be used) for the duration of the COVID-19 declaration under Section 564(b)(1) of the Act, 21 U.S.C. section 360bbb-3(b)(1), unless the authorization is terminated or revoked.  Performed at [facility], [place] [address] [place], [place] [address]   Blood Cultures (routine x 2)     Status: None (Preliminary result)   Collection Time: 10/16/21  7:10 PM   Specimen: BLOOD  Result Value Ref Range Status   Specimen Description   Final    BLOOD BLOOD RIGHT HAND Performed at [place] [address] [place], [place] [address]    Special Requests   Final    BOTTLES DRAWN AEROBIC AND ANAEROBIC Blood Culture results may not be optimal due to an inadequate volume of blood received in culture  bottles Performed at [place] [address], [place], [place] [address]    Culture   Final    NO GROWTH 3 DAYS Performed at [facility], [place] [address] [place], [place] [address]    Report Status PENDING  Incomplete  Blood Cultures (routine x 2)     Status: None (Preliminary result)   Collection Time: 10/16/21  7:15 PM  Specimen: BLOOD  Result Value Ref Range Status   Specimen Description   Final    BLOOD LEFT ANTECUBITAL Performed at [facility] [address], [place], [place] [address]    Special Requests   Final    BOTTLES DRAWN AEROBIC AND ANAEROBIC Blood Culture results may not be optimal due to an inadequate volume of blood received in culture bottles Performed at [facility] [address] [place], [place] [address]    Culture   Final    NO GROWTH 3 DAYS Performed at [facility], [place] [address] [place], [place] [address]    Report Status PENDING  Incomplete  Urine Culture     Status: None   Collection Time: 10/16/21 10:36 PM   Specimen: Urine, Clean Catch  Result Value Ref Range Status   Specimen Description   Final    URINE, CLEAN CATCH Performed at [facility], [place] [address], [place], [place] [address]    Special Requests   Final    NONE Performed at [facility], [place] [address] [place], [place] [address]    Culture   Final    NO GROWTH Performed at [facility], [place] [address] [place], [place] [address]    Report Status 10/18/2021 FINAL  Final  MRSA Next Gen by PCR, Nasal     Status: None   Collection Time: 10/17/21  8:01 AM   Specimen: Nasal Mucosa; Nasal Swab  Result Value Ref Range Status   MRSA by PCR Next Gen NOT DETECTED NOT DETECTED Final    Comment: (NOTE) The GeneXpert MRSA Assay (FDA approved for NASAL specimens only), is one component of Patrick Tran [place] comprehensive MRSA colonization surveillance program. It is not intended to  diagnose MRSA infection nor to guide or monitor treatment for MRSA infections. Test performance is not FDA approved in patients less than 67 years old. Performed at [facility], [place] [address], [place], [place] [address]   Body fluid culture w Gram Stain     Status: None (Preliminary result)   Collection Time: 10/18/21  3:48 PM   Specimen: PATH Cytology Peritoneal fluid  Result Value Ref Range Status   Specimen Description   Final    PERITONEAL Performed at [place] [address] [place], [place] [address]    Special Requests   Final    NONE Performed at [facility], [place] [address], [place], [place] [address]    Gram Stain   Final    NO SQUAMOUS EPITHELIAL CELLS SEEN FEW WBC SEEN NO ORGANISMS SEEN    Culture   Final    NO GROWTH 2 DAYS Performed at [facility], [place] [address], [place], [place] [address]    Report Status PENDING  Incomplete         Radiology Studies: US Paracentesis  Result Date: 10/20/2021 INDICATION: Patient with history hepatocellular carcinoma with recurrent ascites. Request is for therapeutic paracentesis. EXAM: ULTRASOUND GUIDED THERAPEUTIC  PARACENTESIS MEDICATIONS: Lidocaine 1% 10 mL COMPLICATIONS: None immediate. PROCEDURE: Informed written consent was obtained from the patient after Patrick Tran discussion of the risks, benefits and alternatives to treatment. Patrick Tran timeout was performed prior to the initiation of the procedure. Initial ultrasound scanning demonstrates Patrick Tran moderate amount of ascites within the right lower abdominal quadrant. The right lower abdomen was prepped and draped in the usual sterile fashion. 1% lidocaine was used for local anesthesia. Following this, Patrick Tran  19 gauge, 7-cm, Yueh catheter was introduced. An ultrasound image was saved for documentation purposes. The paracentesis was performed. The catheter was removed and Patrick Tran dressing was applied. The patient tolerated the procedure  well without immediate post procedural complication. FINDINGS: Lashauna Arpin total of approximately 4.8 L of straw colored fluid was removed. IMPRESSION: Successful ultrasound-guided paracentesis yielding 4.8 liters of peritoneal fluid. Read by: Patrick Nyhan, NP Electronically Signed   By: Patrick Tran M.D.   On: 10/20/2021 16:35   EEG adult  Result Date: 10/20/2021 Patrick Havens, MD     10/20/2021 11:12 AM Patient Name: Patrick Tran MRN: [id number] Epilepsy Attending: Lora Tran Referring Physician/Provider: Elodia Florence., MD Date: 10/19/2021 Duration: 22.14 mins Patient history: 51 year old male with altered mental status.  EEG to evaluate for seizure. Level of alertness: Awake AEDs during EEG study: None Technical aspects: This EEG study was done with scalp electrodes positioned according to the 10-20 International system of electrode placement. Electrical activity was acquired at Markeith Jue sampling rate of 500Hz  and reviewed with Jazsmine Macari high frequency filter of 70Hz  and Tara Rud low frequency filter of 1Hz . EEG data were recorded continuously and digitally stored. Description: No clear posterior dominant rhythm was seen.  EEG showed continuous generalized 3 to 5 Hz theta- delta slowing.  Intermittent generalized triphasic waves were noted.  Hyperventilation and photic stimulation were not performed.   ABNORMALITY - Continuous slow, generalized -Triphasic wave, generalized IMPRESSION: This study is suggestive of moderate diffuse encephalopathy, nonspecific etiology but most likely secondary to toxic-metabolic causes.  No seizures or definite epileptiform discharges were seen throughout the recording. Patrick Tran        Scheduled Meds:  Chlorhexidine Gluconate Cloth  6 each Topical Daily   furosemide  40 mg Oral Daily   lactulose  30 g Oral TID   lidocaine       mouth rinse  15 mL Mouth Rinse BID   [START ON 10/21/2021] spironolactone  100 mg Oral Daily   thiamine injection  100 mg Intravenous  Daily   Continuous Infusions:  dextrose Stopped (10/19/21 1503)     LOS: 3 days    Time spent: over 30 min    Patrick Helper, MD [facility]   To contact the attending provider between 7A-7P or the covering provider during after hours 7P-7A, please log into the web site [email or web address] and access using universal [place] password for that web site. If you do not have the password, please call the hospital operator.  10/20/2021, 5:12 PM

## 2021-10-21 LAB — COMPREHENSIVE METABOLIC PANEL
ALT: 207 U/L — ABNORMAL HIGH (ref 0–44)
AST: 409 U/L — ABNORMAL HIGH (ref 15–41)
Albumin: <1.5 g/dL — ABNORMAL LOW (ref 3.5–5.0)
Alkaline Phosphatase: 203 U/L — ABNORMAL HIGH (ref 38–126)
Anion gap: 7 (ref 5–15)
BUN: 18 mg/dL (ref 6–20)
CO2: 22 mmol/L (ref 22–32)
Calcium: 7.5 mg/dL — ABNORMAL LOW (ref 8.9–10.3)
Chloride: 100 mmol/L (ref 98–111)
Creatinine, Ser: 0.69 mg/dL (ref 0.61–1.24)
GFR, Estimated: >60 mL/min (ref >60)
Glucose, Bld: 118 mg/dL — ABNORMAL HIGH (ref 70–99)
Potassium: 3.2 mmol/L — ABNORMAL LOW (ref 3.5–5.1)
Sodium: 129 mmol/L — ABNORMAL LOW (ref 135–145)
Total Bilirubin: 7.1 mg/dL — ABNORMAL HIGH (ref 0.3–1.2)
Total Protein: 7.4 g/dL (ref 6.5–8.1)

## 2021-10-21 LAB — CBC WITH DIFFERENTIAL/PLATELET
Abs Immature Granulocytes: 0.39 10*3/uL — ABNORMAL HIGH (ref 0.00–0.07)
Basophils Absolute: 0.1 10*3/uL (ref 0.0–0.1)
Basophils Relative: 1 %
Eosinophils Absolute: 0.1 10*3/uL (ref 0.0–0.5)
Eosinophils Relative: 1 %
HCT: 33.3 % — ABNORMAL LOW (ref 39.0–52.0)
Hemoglobin: 11.5 g/dL — ABNORMAL LOW (ref 13.0–17.0)
Immature Granulocytes: 5 %
Lymphocytes Relative: 25 %
Lymphs Abs: 2.1 10*3/uL (ref 0.7–4.0)
MCH: 34.5 pg — ABNORMAL HIGH (ref 26.0–34.0)
MCHC: 34.5 g/dL (ref 30.0–36.0)
MCV: 100 fL (ref 80.0–100.0)
Monocytes Absolute: 1.2 10*3/uL — ABNORMAL HIGH (ref 0.1–1.0)
Monocytes Relative: 14 %
Neutro Abs: 4.8 10*3/uL (ref 1.7–7.7)
Neutrophils Relative %: 54 %
Platelets: 135 10*3/uL — ABNORMAL LOW (ref 150–400)
RBC: 3.33 MIL/uL — ABNORMAL LOW (ref 4.22–5.81)
RDW: 17.9 % — ABNORMAL HIGH (ref 11.5–15.5)
WBC: 8.7 10*3/uL (ref 4.0–10.5)
nRBC: 0 % (ref 0.0–0.2)

## 2021-10-21 LAB — MAGNESIUM: Magnesium: 1.9 mg/dL (ref 1.7–2.4)

## 2021-10-21 LAB — GLUCOSE, CAPILLARY
Glucose-Capillary: 135 mg/dL — ABNORMAL HIGH (ref 70–99)
Glucose-Capillary: 96 mg/dL (ref 70–99)

## 2021-10-21 LAB — PHOSPHORUS: Phosphorus: 2.5 mg/dL (ref 2.5–4.6)

## 2021-10-21 MED ORDER — SPIRONOLACTONE 100 MG PO TABS: 100.0000 mg | ORAL_TABLET | Freq: Every day | ORAL | 0 refills | Status: AC

## 2021-10-21 MED ORDER — LACTULOSE 10 GM/15ML PO SOLN: 20.0000 g | Freq: Three times a day (TID) | ORAL | 0 refills | Status: DC

## 2021-10-21 MED ORDER — LACTULOSE 10 GM/15ML PO SOLN: 20.0000 g | Freq: Three times a day (TID) | ORAL | 0 refills | Status: AC

## 2021-10-21 MED ORDER — SPIRONOLACTONE 100 MG PO TABS
100.0000 mg | ORAL_TABLET | Freq: Every day | ORAL | 0 refills | Status: DC
Start: 2021-10-21 — End: 2021-10-21

## 2021-10-21 MED ORDER — POTASSIUM CHLORIDE CRYS ER 20 MEQ PO TBCR
40.0000 meq | EXTENDED_RELEASE_TABLET | Freq: Once | ORAL | Status: AC
  Administered 2021-10-21: 40 meq via ORAL
  Filled 2021-10-21: qty 2

## 2021-10-21 MED ORDER — FUROSEMIDE 20 MG PO TABS: 40.0000 mg | ORAL_TABLET | Freq: Every day | ORAL | 0 refills | Status: DC

## 2021-10-21 NOTE — Plan of Care (Signed)
PIV removed. [place] paperwork reviewed with pt via interpreter. Questions answered. Advanced directive materials given to pt. Reported missing a pre-paid credit card even via interpreter unable to discern issue. Checked chart but no belongings were in it or in ICU. It seems card is issued by cancer center. Advised pt to speak with cancer center staff on Monday at his appointment. Pt walked to lobby. After discovering an issue with transportation was lost in translation pt able to contact a friend to come pick him. Verbally confirmed with English speaking friend that friend was on his way to pick up pt. Left pt in [place] lobby. Showed pt that he could see incoming cars through window.   Problem: Education: Goal: Knowledge of General Education information will improve Description: Including pain rating scale, medication(s)/side effects and non-pharmacologic comfort measures Outcome: Progressing   Problem: Health Behavior/Discharge Planning: Goal: Ability to manage health-related needs will improve Outcome: Progressing   Problem: Clinical Measurements: Goal: Ability to maintain clinical measurements within normal limits will improve Outcome: Progressing Goal: Will remain free from infection Outcome: Progressing Goal: Diagnostic test results will improve Outcome: Progressing Goal: Respiratory complications will improve Outcome: Progressing Goal: Cardiovascular complication will be avoided Outcome: Progressing   Problem: Activity: Goal: Risk for activity intolerance will decrease Outcome: Progressing   Problem: Nutrition: Goal: Adequate nutrition will be maintained Outcome: Progressing   Problem: Coping: Goal: Level of anxiety will decrease Outcome: Progressing   Problem: Elimination: Goal: Will not experience complications related to bowel motility Outcome: Progressing Goal: Will not experience complications related to urinary retention Outcome: Progressing   Problem: Pain  Managment: Goal: General experience of comfort will improve Outcome: Progressing   Problem: Safety: Goal: Ability to remain free from injury will improve Outcome: Progressing   Problem: Skin Integrity: Goal: Risk for impaired skin integrity will decrease Outcome: Progressing

## 2021-10-21 NOTE — Discharge Summary (Addendum)
Physician Discharge Summary  Patrick Tran HCW:[id number] DOB: 1970-12-28 DOA: 10/16/2021  PCP: Patient, No Pcp Per (Inactive)  Admit date: 10/16/2021 Discharge date: 10/21/2021  Time spent: 40 minutes  Recommendations for Outpatient Follow-up:  Follow outpatient CBC/CMP Follow with oncology outpatient - needs surrogate decision maker, he mentions Patrick Tran as the person he'd want, follow up advanced care planning with oncology Follow up with gastroenterology outpatient Follow pending ascites fluid culture Follow hepatic encephalopathy with lactulose, tolerance of lactulose He's holding narcotics at discharge, would resume with caution  Covid, isolate until after March 2   Discharge Diagnoses:  Principal Problem:   Acute hepatic encephalopathy Active Problems:   Ascites   COVID-19 virus infection   Hepatocellular carcinoma (HCC)   Volume overload   Hepatic cirrhosis due to chronic hepatitis C infection ([place])   Opiate use, concern for overdose   Hyponatremia   DNR (do not resuscitate)/DNI(Do Not Intubate)   Counseling regarding advance directives and goals of care   Hyperkalemia   Discharge Condition: stable  Diet recommendation: low sodium  Filed Weights   10/17/21 0800 10/20/21 0438 10/21/21 0438  Weight: 63.2 kg 63.9 kg 64.2 kg    History of present illness:  51 yo who came from [place] to [place] in 1992 (without children or blood relatives here in [place]), hx cirrhosis 2/2 hepatitis C, hepatocellular carcinoma, ascites who presented to the hospital with acute metabolic encephalopathy initially with concern for opiate overdose as well as incidentally covid positive.  He's now been found to have hepatic encephalopathy which has improved with lactulose.  Consider discharge 2/25.  See below for additional details  Hospital Course:  Assessment and Plan: * Acute hepatic encephalopathy Resolved Likely hepatic encephalopathy with improvement with lactulose - suspect this was  precipitated by opiates with his partial response to narcan at presentation, covid may have precipitated as well (covid or other causes maybe contributing) Will discharge with lactulose TID with goal of 3 BM's daily, titrate for this Avoid narcotics at discharge, caution if initiating outpatient  No great options for covid treatment or convincing evidence treatment indicated (no hypoxia, LFT's >10x normal, precluding use of remdesivir) Follow VBG (pCO2 wnl), b12 (wnl), folate (wnl), TSH (wnl), urinalysis (UA wnl on 2/20 and UA on 2/22 nitrite positive - but asymptomatic and urine cx 2/20 ng), repeat chest x ray (left basilar opacities may represent airspace opacities or overlying cardiac shadow given pt positioning) S/p para not c/w sbp Head CT without acute abnormalities EEG with moderate diffuse encephalopathy, nonsepecific Narcan gtt d/c'd D/c ceftriaxone without evidence of SBP    COVID-19 virus infection On RA Possibly contributing to his encephalopathy No need for steroids as doing well on RA Isolate until after 3/2  Ascites- (present on admission) Ascites fluid not c/w SBP Cytology (no malignant cells) and culture NGx2 Therapeutic paracentesis with 4.8 L off on 2/24 Follow repeat cell count (not c/w SBP) and culture (pending) Lasix 40, spironolactone 100 at discharge   Hepatocellular carcinoma ([place])- (present on admission) Started treatment recently with oncology Last saw Dr. Burr Medico 10/13/2021 Follow with Dr. Burr Medico after discharge, message sent   Volume overload 2/2 cirrhosis LE edema improving Still with notable ascites - adjust diuretics as tolerated outpatient  Hyponatremia Due to volume Follow with diuresis  Opiate use, concern for overdose Apparently initially responded to narcan and overdose could have been component of encephalopathy or precipitate of hepatic encephalopathy, but with continued obtundation on narcan gtt, suspect this was limited component and  hepatic encephalopathy  driving process Off narcan Hold narcotics at discharge Caution with reinitiation   Hepatic cirrhosis due to chronic hepatitis C infection ([place]) Follows with GI outpatient S/p banding on 2/3 of esophageal varices Bili ~7.1, relatively stable with recent labs Hold on beta blocker with hyponatremia.  Continue lasix and spironolactone. Follow up with gastroenterology outpatient, message sent   DNR (do not resuscitate)/DNI(Do Not Intubate) Reviewed pt's DNR form that was his in folder that was brought with him.     Counseling regarding advance directives and goals of care He names Patrick Tran as the person he'd like to help make decisions if he's unable Will see if chaplain around today to help with medical power of attorney paperwork, if not, will recommend this is done at his oncology follow up  Hyperkalemia resolved   Procedures: EEG IMPRESSION: This study is suggestive of moderate diffuse encephalopathy, nonspecific etiology but most likely secondary to toxic-metabolic causes.  No seizures or definite epileptiform discharges were seen throughout the recording.  Paracentesis x2 on 2/22 and 2/24 by IR   Consultations: Palliative care  Discharge Exam: Vitals:   10/20/21 2032 10/21/21 0430  BP: (!) 150/97 (!) 154/98  Pulse: (!) 101 (!) 104  Resp: 16 20  Temp: (!) 97.3 F (36.3 C) 98.3 F (36.8 C)  SpO2: 97% 100%   Discussed with interpreter Abdominal pain is better He'd like Patrick Tran to help with decision if he's unable Discussed d/c recommendations   General: No acute distress. Cardiovascular:RRR Lungs: unlabored Abdomen: continued abdominal distension  Neurological: Alert and oriented 3. Moves all extremities 4 with equal strength. Cranial nerves II through XII grossly intact.  No asterixis. Skin: Warm and dry. No rashes or lesions. Extremities: No clubbing or cyanosis. No edema.   Discharge Instructions   Discharge Instructions      Call MD for:  difficulty breathing, headache or visual disturbances   Complete by: As directed    Call MD for:  extreme fatigue   Complete by: As directed    Call MD for:  hives   Complete by: As directed    Call MD for:  persistant dizziness or light-headedness   Complete by: As directed    Call MD for:  persistant nausea and vomiting   Complete by: As directed    Call MD for:  redness, tenderness, or signs of infection (pain, swelling, redness, odor or green/yellow discharge around incision site)   Complete by: As directed    Call MD for:  severe uncontrolled pain   Complete by: As directed    Call MD for:  temperature >100.4   Complete by: As directed    Diet - low sodium heart healthy   Complete by: As directed    Discharge instructions   Complete by: As directed    You were seen for hepatic encephalopathy (confusion due to liver disease or cirrhosis).    This has improved with lactulose.  Continue the lactulose, titrate (adjust) this for Tamkia Temples goal of at least 3 bowel movements Leith Hedlund day.    Avoid tramadol and morphine at this point as these medicines can cause confusion.  We've increased your lasix and spironolactone for the fluid on your belly and legs.  Follow up labs within Braylin Xu week or so with Dr. Burr Medico or gastroenterology.   You should follow up with gastroenterology as an outpatient.  You should have Sovereign Ramiro surrogate decision maker.  We spoke about Patrick Tran as the person you would like to help make decisions for you  if you're not able.  The oncology clinic can help you with medical power of attorney paperwork to make this official.   You have covid.  Mask and isolate until after March 2nd.  Seek care if you develop fever or other symptoms.  You have pending labs from your paracentesis (the procedure to remove fluid from your belly).  Follow these results with Dr. Burr Medico or your gastroenterologist.  Return for new, recurrent, or worsening symptoms.  Please ask your PCP to request  records from this hospitalization so they know what was done and what the next steps will be.   Increase activity slowly   Complete by: As directed       Allergies as of 10/21/2021   No Known Allergies      Medication List     STOP taking these medications    morphine 15 MG tablet Commonly known as: MSIR   traMADol 50 MG tablet Commonly known as: Ultram       TAKE these medications    furosemide 20 MG tablet Commonly known as: LASIX Take 2 tablets (40 mg total) by mouth daily. What changed: how much to take   lactulose 10 GM/15ML solution Commonly known as: CHRONULAC Take 30 mLs (20 g total) by mouth 3 (three) times daily. Take at least once daily for Gerhard Rappaport goal of 3 BM's Jazzelle Zhang day.  Titrate for 3 bowel movements Gerry Heaphy day.   omeprazole 40 MG capsule Commonly known as: PRILOSEC Take 1 capsule (40 mg total) by mouth 2 (two) times daily before Zair Borawski meal.   ondansetron 4 MG tablet Commonly known as: Zofran Take 1 tablet (4 mg total) by mouth every 8 (eight) hours as needed for nausea or vomiting.   spironolactone 100 MG tablet Commonly known as: ALDACTONE Take 1 tablet (100 mg total) by mouth daily. What changed: Another medication with the same name was removed. Continue taking this medication, and follow the directions you see here.   Talicia 154-00.8-67 MG Cpdr Generic drug: Amoxicill-Rifabutin-Omeprazole Take 4 capsules by mouth every 8 (eight) hours.       No Known Allergies    The results of significant diagnostics from this hospitalization (including imaging, microbiology, ancillary and laboratory) are listed below for reference.    Significant Diagnostic Studies: CT HEAD WO CONTRAST  Result Date: 10/16/2021 CLINICAL DATA:  Mental status change EXAM: CT HEAD WITHOUT CONTRAST TECHNIQUE: Contiguous axial images were obtained from the base of the skull through the vertex without intravenous contrast. RADIATION DOSE REDUCTION: This exam was performed according to the  departmental dose-optimization program which includes automated exposure control, adjustment of the mA and/or kV according to patient size and/or use of iterative reconstruction technique. COMPARISON:  CT brain report 08/19/2009 FINDINGS: Brain: No evidence of acute infarction, hemorrhage, hydrocephalus, extra-axial collection or mass lesion/mass effect. Vascular: No hyperdense vessel or unexpected calcification. Skull: Normal. Negative for fracture or focal lesion. Sinuses/Orbits: No acute finding. Other: None IMPRESSION: Negative non contrasted CT appearance of the brain. Electronically Signed   By: Donavan Foil M.D.   On: 10/16/2021 20:40   US Paracentesis  Result Date: 10/20/2021 INDICATION: Patient with history hepatocellular carcinoma with recurrent ascites. Request is for therapeutic paracentesis. EXAM: ULTRASOUND GUIDED THERAPEUTIC  PARACENTESIS MEDICATIONS: Lidocaine 1% 10 mL COMPLICATIONS: None immediate. PROCEDURE: Informed written consent was obtained from the patient after Bryley Kovacevic discussion of the risks, benefits and alternatives to treatment. Lilliauna Van timeout was performed prior to the initiation of the procedure. Initial ultrasound scanning demonstrates Jsiah Menta  moderate amount of ascites within the right lower abdominal quadrant. The right lower abdomen was prepped and draped in the usual sterile fashion. 1% lidocaine was used for local anesthesia. Following this, Leydy Worthey 19 gauge, 7-cm, Yueh catheter was introduced. An ultrasound image was saved for documentation purposes. The paracentesis was performed. The catheter was removed and Truong Delcastillo dressing was applied. The patient tolerated the procedure well without immediate post procedural complication. FINDINGS: Trice Aspinall total of approximately 4.8 L of straw colored fluid was removed. IMPRESSION: Successful ultrasound-guided paracentesis yielding 4.8 liters of peritoneal fluid. Read by: Rushie Nyhan, NP Electronically Signed   By: Lucrezia Europe M.D.   On: 10/20/2021 16:35   US  Paracentesis  Result Date: 10/18/2021 INDICATION: Patient with history of hepatocellular carcinoma with recurrent ascites. Request for IR to perform diagnostic and therapeutic paracentesis with Cordarryl Monrreal 2 L max. EXAM: ULTRASOUND GUIDED DIAGNOSTIC AND THERAPEUTIC LEFT LOWER QUADRANT PARACENTESIS MEDICATIONS: 10 mL 1 % lidocaine COMPLICATIONS: None immediate. PROCEDURE: Informed written consent was obtained from the patient after Aunna Snooks discussion of the risks, benefits and alternatives to treatment. Aamya Orellana timeout was performed prior to the initiation of the procedure. Initial ultrasound scanning demonstrates Jermika Olden large amount of ascites within the left lower abdominal quadrant. The left lower abdomen was prepped and draped in the usual sterile fashion. 1% lidocaine was used for local anesthesia. Following this, Gaynor Genco 19 gauge, 7-cm, Yueh catheter was introduced. An ultrasound image was saved for documentation purposes. The paracentesis was performed. The catheter was removed and Zackeriah Kissler dressing was applied. The patient tolerated the procedure well without immediate post procedural complication. FINDINGS: Chelsee Hosie total of approximately 2 L of clear, yellow fluid was removed. Samples were sent to the laboratory as requested by the clinical team. IMPRESSION: Successful ultrasound-guided paracentesis yielding 2 liters of peritoneal fluid. Read by: Narda Rutherford, AGNP-BC Electronically Signed   By: Corrie Mckusick D.O.   On: 10/18/2021 16:45   DG CHEST PORT 1 VIEW  Result Date: 10/18/2021 CLINICAL DATA:  Altered mental status with acute metabolic encephalopathy. EXAM: PORTABLE CHEST 1 VIEW COMPARISON:  Chest radiograph dated 10/16/2021. FINDINGS: The patient is rotated to the left which limits the sensitivity of the exam. Left basilar opacities may represent airspace opacities or overlying cardiac shadow. The right lung is clear. There is no pneumothorax or pleural effusion. The osseous structures appear intact. IMPRESSION: Left basilar opacities may  represent airspace opacities or overlying cardiac shadow given patient positioning. Electronically Signed   By: Zerita Boers M.D.   On: 10/18/2021 10:21   DG Chest Port 1 View  Result Date: 10/16/2021 CLINICAL DATA:  Altered mental status. EXAM: PORTABLE CHEST 1 VIEW COMPARISON:  None. FINDINGS: Mild, stable right-sided volume loss is seen with mild, stable atelectatic changes noted within the right lung base. There is no evidence of an acute infiltrate, pleural effusion or pneumothorax. The heart size and mediastinal contours are within normal limits. The visualized skeletal structures are unremarkable. IMPRESSION: Stable right-sided volume loss with mild, stable right basilar atelectasis. Electronically Signed   By: Virgina Norfolk M.D.   On: 10/16/2021 19:38   EEG adult  Result Date: 10/20/2021 Lora Havens, MD     10/20/2021 11:12 AM Patient Name: Joeangel Jeanpaul MRN: [id number] Epilepsy Attending: Lora Havens Referring Physician/Provider: Elodia Florence., MD Date: 10/19/2021 Duration: 22.14 mins Patient history: 51 year old male with altered mental status.  EEG to evaluate for seizure. Level of alertness: Awake AEDs during EEG study: None Technical aspects: This EEG study  was done with scalp electrodes positioned according to the 10-20 International system of electrode placement. Electrical activity was acquired at Twania Bujak sampling rate of 500Hz  and reviewed with Kamarii Carton high frequency filter of 70Hz  and Suvi Archuletta low frequency filter of 1Hz . EEG data were recorded continuously and digitally stored. Description: No clear posterior dominant rhythm was seen.  EEG showed continuous generalized 3 to 5 Hz theta- delta slowing.  Intermittent generalized triphasic waves were noted.  Hyperventilation and photic stimulation were not performed.   ABNORMALITY - Continuous slow, generalized -Triphasic wave, generalized IMPRESSION: This study is suggestive of moderate diffuse encephalopathy, nonspecific etiology  but most likely secondary to toxic-metabolic causes.  No seizures or definite epileptiform discharges were seen throughout the recording. Priyanka [name]   IR Paracentesis  Result Date: 10/04/2021 INDICATION: Patient with Versia Mignogna history of hepatocellular carcinoma with recurrent ascites presents today for therapeutic paracentesis. EXAM: ULTRASOUND GUIDED PARACENTESIS MEDICATIONS: 1% lidocaine 10 mL COMPLICATIONS: None immediate. PROCEDURE: Informed written consent was obtained from the patient after Ina Scrivens discussion of the risks, benefits and alternatives to treatment. Azzam Mehra timeout was performed prior to the initiation of the procedure. Initial ultrasound scanning demonstrates Joanthan Hlavacek large amount of ascites within the right lower abdominal quadrant. The right lower abdomen was prepped and draped in the usual sterile fashion. 1% lidocaine was used for local anesthesia. Following this, Chrishonda Hesch 19 gauge, 7-cm, Yueh catheter was introduced. An ultrasound image was saved for documentation purposes. The paracentesis was performed. The catheter was removed and Khamani Fairley dressing was applied. The patient tolerated the procedure well without immediate post procedural complication. FINDINGS: Tomislav Micale total of approximately 2.4 L of clear yellow fluid was removed. IMPRESSION: Successful ultrasound-guided paracentesis yielding 2.4 liters of peritoneal fluid. Read by: Soyla Dryer, NP Electronically Signed   By: Corrie Mckusick D.O.   On: 10/04/2021 15:41    Microbiology: Recent Results (from the past 240 hour(s))  Resp Panel by RT-PCR (Flu Mikhaela Zaugg&B, Covid) Nasopharyngeal Swab     Status: Abnormal   Collection Time: 10/16/21  7:10 PM   Specimen: Nasopharyngeal Swab; Nasopharyngeal(NP) swabs in vial transport medium  Result Value Ref Range Status   SARS Coronavirus 2 by RT PCR POSITIVE (Neale Marzette) NEGATIVE Final    Comment: (NOTE) SARS-CoV-2 target nucleic acids are DETECTED.  The SARS-CoV-2 RNA is generally detectable in upper respiratory specimens during the  acute phase of infection. Positive results are indicative of the presence of the identified virus, but do not rule out bacterial infection or co-infection with other pathogens not detected by the test. Clinical correlation with patient history and other diagnostic information is necessary to determine patient infection status. The expected result is Negative.  Fact Sheet for Patients: EntrepreneurPulse.com.au  Fact Sheet for Healthcare Providers: IncredibleEmployment.be  This test is not yet approved or cleared by the [place] FDA and  has been authorized for detection and/or diagnosis of SARS-CoV-2 by FDA under an Emergency Use Authorization (EUA).  This EUA will remain in effect (meaning this test can be used) for the duration of  the COVID-19 declaration under Section 564(b)(1) of the [place]enda Heiner ct, 21 U.S.C. section 360bbb-3(b)(1), unless the authorization is terminated or revoked sooner.     Influenza Shariyah Eland by PCR NEGATIVE NEGATIVE Final   Influenza B by PCR NEGATIVE NEGATIVE Final    Comment: (NOTE) The Xpert Xpress SARS-CoV-2/FLU/RSV plus assay is intended as an aid in the diagnosis of influenza from Nasopharyngeal swab specimens and should not be used as Laker Thompson sole basis for treatment. Nasal washings  and aspirates are unacceptable for Xpert Xpress SARS-CoV-2/FLU/RSV testing.  Fact Sheet for Patients: EntrepreneurPulse.com.au  Fact Sheet for Healthcare Providers: IncredibleEmployment.be  This test is not yet approved or cleared by the [place] FDA and has been authorized for detection and/or diagnosis of SARS-CoV-2 by FDA under an Emergency Use Authorization (EUA). This EUA will remain in effect (meaning this test can be used) for the duration of the COVID-19 declaration under Section 564(b)(1) of the Act, 21 U.S.C. section 360bbb-3(b)(1), unless the authorization is terminated or revoked.  Performed at  [facility], [place] [address], [place], [place] [address]   Blood Cultures (routine x 2)     Status: None (Preliminary result)   Collection Time: 10/16/21  7:10 PM   Specimen: BLOOD  Result Value Ref Range Status   Specimen Description   Final    BLOOD BLOOD RIGHT HAND Performed at [facility] [address], [place], [place] [address]    Special Requests   Final    BOTTLES DRAWN AEROBIC AND ANAEROBIC Blood Culture results may not be optimal due to an inadequate volume of blood received in culture bottles Performed at [facility] [address] [place], [place] [address]    Culture   Final    NO GROWTH 4 DAYS Performed at [facility], [place] [address] [place], [place] [address]    Report Status PENDING  Incomplete  Blood Cultures (routine x 2)     Status: None (Preliminary result)   Collection Time: 10/16/21  7:15 PM   Specimen: BLOOD  Result Value Ref Range Status   Specimen Description   Final    BLOOD LEFT ANTECUBITAL Performed at [facility] [address], [place], [place] [address]    Special Requests   Final    BOTTLES DRAWN AEROBIC AND ANAEROBIC Blood Culture results may not be optimal due to an inadequate volume of blood received in culture bottles Performed at [facility] [address] [place], [place] [address]    Culture   Final    NO GROWTH 4 DAYS Performed at [facility], [place] [address], [place], [place] [address]    Report Status PENDING  Incomplete  Urine Culture     Status: None   Collection Time: 10/16/21 10:36 PM   Specimen: Urine, Clean Catch  Result Value Ref Range Status   Specimen Description   Final    URINE, CLEAN CATCH Performed at [facility], [place] [address] [place], [place] [address]    Special Requests   Final    NONE Performed at [facility], [place] [address], [place], [place]  [address]    Culture   Final    NO GROWTH Performed at [facility], [place] [address], [place],  [address]    Report Status 10/18/2021 FINAL  Final  MRSA Next Gen by PCR, Nasal     Status: None   Collection Time: 10/17/21  8:01 AM   Specimen: Nasal Mucosa; Nasal Swab  Result Value Ref Range Status   MRSA by PCR Next Gen NOT DETECTED NOT DETECTED Final    Comment: (NOTE) The GeneXpert MRSA Assay (FDA approved for NASAL specimens only), is one component of Axxel Gude comprehensive MRSA colonization surveillance program. It is not intended to diagnose MRSA infection nor to guide or monitor treatment for MRSA infections. Test performance is not FDA approved in patients less than 2  years old. Performed at [facility], [place] [address], [place], [place] [address]   Body fluid culture w Gram Stain     Status: None (Preliminary result)   Collection Time: 10/18/21  3:48 PM   Specimen: PATH Cytology Peritoneal fluid  Result Value Ref Range Status   Specimen Description   Final    PERITONEAL Performed at [facility] [address], [place], [place] [address]    Special Requests   Final    NONE Performed at [facility], [place] [address] [place], [place] [address]    Gram Stain   Final    NO SQUAMOUS EPITHELIAL CELLS SEEN FEW WBC SEEN NO ORGANISMS SEEN    Culture   Final    NO GROWTH 3 DAYS Performed at [facility], [place] [address], [place], [place] [address]    Report Status PENDING  Incomplete  Body fluid culture w Gram Stain     Status: None (Preliminary result)   Collection Time: 10/20/21  4:50 PM   Specimen: PATH Cytology Peritoneal fluid  Result Value Ref Range Status   Specimen Description   Final    PERITONEAL Performed at [facility] [address] [place], [place] [address]    Special Requests   Final    NONE Performed at [facility], [place] [address],  [place], [place] [address]    Gram Stain   Final    RARE WBC PRESENT, PREDOMINANTLY MONONUCLEAR NO ORGANISMS SEEN Performed at [facility], [place] [address] [place], [place] [address]    Culture PENDING  Incomplete   Report Status PENDING  Incomplete     Labs: Basic Metabolic Panel: Recent Labs  Lab 10/16/21 1910 10/16/21 1951 10/17/21 0836 10/18/21 0311 10/18/21 2034 10/19/21 0258 10/20/21 0344 10/21/21 0334  NA  --    < > 130*   131* 128* 128* 129* 131* 129*  K  --    < > 5.3*   5.2* 4.2 5.5* 3.5 3.4* 3.2*  CL  --    < > 101   101 99 100 100 102 100  CO2  --    < > 26   27 24 24 24  21* 22  GLUCOSE  --    < > 220*   200* 127* 92 94 111* 118*  BUN  --    < > 26*   26* 16 12 13 19 18   CREATININE  --    < > 0.64   0.68 0.45* <0.30* 0.45* 0.66 0.69  CALCIUM  --    < > 7.7*   7.9* 8.0* 7.7* 7.8* 7.8* 7.5*  MG 2.7*  --  2.2  --   --  1.7 1.8 1.9  PHOS  --   --   --   --   --  3.5 3.9 2.5   < > = values in this interval not displayed.   Liver Function Tests: Recent Labs  Lab 10/17/21 0836 10/18/21 0311 10/19/21 0258 10/20/21 0344 10/21/21 0334  AST 506* 540* 488* 434* 409*  ALT 237* 262* 244* 219* 207*  ALKPHOS 197* 196* 191* 193* 203*  BILITOT 5.8* 6.4* 6.9* 7.4* 7.1*  PROT 6.5 6.8 6.9 7.1 7.4  ALBUMIN <1.5* <1.5* <1.5* <1.5* <1.5*   No results for input(s): LIPASE, AMYLASE in the last 168 hours. Recent Labs  Lab 10/17/21 1458 10/18/21 0753 10/18/21 2034 10/19/21 0258 10/20/21 0344  AMMONIA 49*  77* 128* 72* 35   CBC: Recent Labs  Lab 10/17/21 0836 10/18/21 0311 10/19/21 0258 10/20/21 0344 10/21/21 0334  WBC 6.2 7.2 6.0 7.4 8.7  NEUTROABS 3.6 3.7 3.4 3.7 4.8  HGB 9.9* 10.8* 11.9* 12.3* 11.5*  HCT 28.7* 31.2* 34.4* 34.1* 33.3*  MCV 100.3* 99.7 99.1 96.9 100.0  PLT 120* 125* 122* 128* 135*   Cardiac Enzymes: No results for input(s): CKTOTAL, CKMB, CKMBINDEX, TROPONINI in the last 168 hours. BNP: BNP (last 3 results) No results for input(s): BNP  in the last 8760 hours.  ProBNP (last 3 results) No results for input(s): PROBNP in the last 8760 hours.  CBG: Recent Labs  Lab 10/20/21 1641 10/20/21 2029 10/20/21 2335 10/21/21 0432 10/21/21 0724  GLUCAP 170* 97 92 135* 96       Signed:  Fayrene Helper MD.  [facility] 10/21/2021, 10:11 AM

## 2021-10-22 LAB — CULTURE, BLOOD (ROUTINE X 2)
Culture: NO GROWTH
Culture: NO GROWTH

## 2021-10-22 LAB — BODY FLUID CULTURE W GRAM STAIN
Culture: NO GROWTH
Gram Stain: NONE SEEN

## 2021-10-23 ENCOUNTER — Other Ambulatory Visit: Payer: Self-pay

## 2021-10-23 ENCOUNTER — Inpatient Hospital Stay: Payer: BC Managed Care – PPO

## 2021-10-23 ENCOUNTER — Telehealth: Payer: Self-pay

## 2021-10-23 ENCOUNTER — Inpatient Hospital Stay (HOSPITAL_BASED_OUTPATIENT_CLINIC_OR_DEPARTMENT_OTHER): Payer: BC Managed Care – PPO | Admitting: Nurse Practitioner

## 2021-10-23 ENCOUNTER — Inpatient Hospital Stay (HOSPITAL_BASED_OUTPATIENT_CLINIC_OR_DEPARTMENT_OTHER): Payer: BC Managed Care – PPO | Admitting: Hematology

## 2021-10-23 ENCOUNTER — Encounter: Payer: Self-pay | Admitting: Hematology

## 2021-10-23 ENCOUNTER — Encounter: Payer: Self-pay | Admitting: Nurse Practitioner

## 2021-10-23 DIAGNOSIS — R180 Malignant ascites: Secondary | ICD-10-CM

## 2021-10-23 DIAGNOSIS — Z66 Do not resuscitate: Secondary | ICD-10-CM

## 2021-10-23 DIAGNOSIS — Z8616 Personal history of COVID-19: Secondary | ICD-10-CM

## 2021-10-23 DIAGNOSIS — C220 Liver cell carcinoma: Secondary | ICD-10-CM

## 2021-10-23 DIAGNOSIS — I8500 Esophageal varices without bleeding: Secondary | ICD-10-CM | POA: Diagnosis not present

## 2021-10-23 DIAGNOSIS — Z79899 Other long term (current) drug therapy: Secondary | ICD-10-CM | POA: Diagnosis not present

## 2021-10-23 DIAGNOSIS — G893 Neoplasm related pain (acute) (chronic): Secondary | ICD-10-CM

## 2021-10-23 DIAGNOSIS — R109 Unspecified abdominal pain: Secondary | ICD-10-CM | POA: Diagnosis not present

## 2021-10-23 DIAGNOSIS — K7460 Unspecified cirrhosis of liver: Secondary | ICD-10-CM

## 2021-10-23 DIAGNOSIS — B182 Chronic viral hepatitis C: Secondary | ICD-10-CM

## 2021-10-23 DIAGNOSIS — Z5112 Encounter for antineoplastic immunotherapy: Secondary | ICD-10-CM | POA: Diagnosis not present

## 2021-10-23 DIAGNOSIS — R110 Nausea: Secondary | ICD-10-CM

## 2021-10-23 DIAGNOSIS — Z7189 Other specified counseling: Secondary | ICD-10-CM

## 2021-10-23 DIAGNOSIS — R140 Abdominal distension (gaseous): Secondary | ICD-10-CM | POA: Diagnosis not present

## 2021-10-23 DIAGNOSIS — Z515 Encounter for palliative care: Secondary | ICD-10-CM

## 2021-10-23 LAB — CMP (CANCER CENTER ONLY)
ALT: 204 U/L — ABNORMAL HIGH (ref 0–44)
AST: 370 U/L (ref 15–41)
Albumin: 2 g/dL — ABNORMAL LOW (ref 3.5–5.0)
Alkaline Phosphatase: 260 U/L — ABNORMAL HIGH (ref 38–126)
Anion gap: 4 — ABNORMAL LOW (ref 5–15)
BUN: 21 mg/dL — ABNORMAL HIGH (ref 6–20)
CO2: 22 mmol/L (ref 22–32)
Calcium: 7.9 mg/dL — ABNORMAL LOW (ref 8.9–10.3)
Chloride: 100 mmol/L (ref 98–111)
Creatinine: 0.77 mg/dL (ref 0.61–1.24)
GFR, Estimated: >60 mL/min (ref >60)
Glucose, Bld: 125 mg/dL — ABNORMAL HIGH (ref 70–99)
Potassium: 4.8 mmol/L (ref 3.5–5.1)
Sodium: 126 mmol/L — ABNORMAL LOW (ref 135–145)
Total Bilirubin: 8.7 mg/dL (ref 0.3–1.2)
Total Protein: 8.3 g/dL — ABNORMAL HIGH (ref 6.5–8.1)

## 2021-10-23 LAB — CBC WITH DIFFERENTIAL (CANCER CENTER ONLY)
Abs Immature Granulocytes: 0.59 10*3/uL — ABNORMAL HIGH (ref 0.00–0.07)
Basophils Absolute: 0.1 10*3/uL (ref 0.0–0.1)
Basophils Relative: 1 %
Eosinophils Absolute: 0.1 10*3/uL (ref 0.0–0.5)
Eosinophils Relative: 1 %
HCT: 32.1 % — ABNORMAL LOW (ref 39.0–52.0)
Hemoglobin: 11.4 g/dL — ABNORMAL LOW (ref 13.0–17.0)
Immature Granulocytes: 5 %
Lymphocytes Relative: 20 %
Lymphs Abs: 2.3 10*3/uL (ref 0.7–4.0)
MCH: 34.5 pg — ABNORMAL HIGH (ref 26.0–34.0)
MCHC: 35.5 g/dL (ref 30.0–36.0)
MCV: 97.3 fL (ref 80.0–100.0)
Monocytes Absolute: 1.2 10*3/uL — ABNORMAL HIGH (ref 0.1–1.0)
Monocytes Relative: 11 %
Neutro Abs: 7.5 10*3/uL (ref 1.7–7.7)
Neutrophils Relative %: 62 %
Platelet Count: 124 10*3/uL — ABNORMAL LOW (ref 150–400)
RBC: 3.3 MIL/uL — ABNORMAL LOW (ref 4.22–5.81)
RDW: 17.4 % — ABNORMAL HIGH (ref 11.5–15.5)
Smear Review: NORMAL
WBC Count: 11.8 10*3/uL — ABNORMAL HIGH (ref 4.0–10.5)
nRBC: 0.3 % — ABNORMAL HIGH (ref 0.0–0.2)

## 2021-10-23 LAB — TSH: TSH: 3.289 u[IU]/mL (ref 0.320–4.118)

## 2021-10-23 LAB — TOTAL PROTEIN, URINE DIPSTICK

## 2021-10-23 MED ORDER — ONDANSETRON HCL 4 MG PO TABS: 4.0000 mg | ORAL_TABLET | Freq: Four times a day (QID) | ORAL | 0 refills | Status: DC | PRN

## 2021-10-23 MED ORDER — MORPHINE SULFATE (CONCENTRATE) 10 MG /0.5 ML PO SOLN
10.0000 mg | ORAL | 0 refills | Status: DC | PRN
Start: 2021-10-23 — End: 2021-10-25

## 2021-10-23 NOTE — Telephone Encounter (Signed)
CRITICAL VALUE STICKER  CRITICAL VALUE: Total Bili  8.7 mg/dl,  AST 370 U/L  RECEIVER (on-site recipient of call): [employer] RN  [place] NOTIFIED: [place] (representative from lab): Ulice Dash  MD NOTIFIED:  Dr. Trina Ao RN  TIME OF NOTIFICATION: [id number]  RESPONSE:  OK

## 2021-10-23 NOTE — Telephone Encounter (Signed)
I called Misty, RN with [facility] to notify her of Mr. Justen referral to their hospice services. Report given. Notified of paracentesis and Pleurx being scheduled.

## 2021-10-23 NOTE — Progress Notes (Addendum)
[place]   [phone] Fax:([phone]   Clinic Follow up Note   Patient Care Team: Patient, No Pcp Per (Inactive) as PCP - General ([place]) Truitt Merle, MD as Consulting Physician (Hematology) Alla Feeling, NP as Nurse Practitioner (Nurse Practitioner) Jerene Bears, MD as Consulting Physician (Gastroenterology)  Date of Service:  10/23/2021  CHIEF COMPLAINT: f/u recent hospital discharge   CURRENT THERAPY:  First line tecentriq every 3 weeks  ASSESSMENT & PLAN:  Patrick Tran is a 51 y.o. male with   1. Multifocal HCC,cT3N0M0 stage IIIA  -recently diagnosed untreated hepatitis C, liver cirrhosis, ascites. Child-Pugh score is 11 (class C) -MRI 09/08/21 showed findings c/w HCC including multiple bilateral liver masses: two LR-5 lesions, one centered in segments 7 and 8 measuring 10.7 cm and one within segment 2 measuring 1.7 cm; one LR-4 lesion in segment 4A measuring 1.5 cm.  -AFP markedly elevated to 122,267 ng/mL, biopsy not felt to be necessary. -His overall prognosis is extremely poor due to his liver failure, he is not a candidate for liver targeted therapy or TKI's.  He began immunotherapy atezolizumab (tecentriq) q21 days on 10/02/21, this was denied by his insurance and drug was replaced. We are not able to replace AVastin. He tolerated well, but he continues to have multiple symptoms from his cancer. -He was recently hospitalized for hepatic encephalopathy, discharged 2 days ago. -Lab reviewed, total bilirubin worsened to 8.7 today. -Due to his worsening liver function, poor performance status, he is not a candidate for more treatment. I reviewed with him today. -I recommend hospice care, I explained the service to him in detail, he agrees. -He will be seen by palliative medicine NP Lexine Baton today, she will help transition him to hospice care.   2. Symptom Management: Abdominal distention/ascites, postprandial pain, anorexia, and  recent encephalopathy from liver failure -Secondary to #1 and cirrhosis  -S/p paracentesis 09/09/21, 1.3 L removed. Cytology was negative for malignant cells. Repeat 2/8, 2/22, 2/24, and tomorrow, 2/28. -his abdominal fluids are building up again, which causes him difficulty eating, as the food cannot pass well because of the fluids. -I will set up paracentesis tomorrow, and Pleurx next week.  He agrees    3. 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 This has not been treated. We reviewed HCV and cirrhosis likely caused [place] -EGD on 09/29/21 showed esophageal varices, which were banded. Repeat scheduled 11/01/21 with Dr. Bryan Lemma. -T bili today is worsened to 8.7 (10/23/21). - F/up Dr. Hilarie Tran    4. Social  -He left [place] in 1992, moved to [place] in 2008. He lives with a friend, no children or blood relatives here.  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 We previously wrote a letter extending his leave indefinitely due to his new diagnosis and pending treatment.  -He has been referred to SW for financial resources, emotional support, and possible transportation needs   5. Goals of care, DNR -We reviewed that since his [facility] is not resectable, it is likely not curable, but still treatable.  -typically we use multimodal approach, but due to his liver dysfunction, he may not be a candidate for liver directed therapies.  -We recommend systemic treatment, he understands the goal is palliative, to control disease, improve his cancer-related symptoms, and prolong is life.  -he agreed with DNR on 10/13/21     PLAN: -paracentesis tomorrow,  2/28 -will stop Tecentriq, he is not a candidate for any cancer treatment, due to his worsening liver failure  -He agreed to hospice today.  He will be seen by palliative care team today  -f/u open    No problem-specific Assessment & Plan notes  found for this encounter.   SUMMARY OF ONCOLOGIC HISTORY: Oncology History  Hepatocellular carcinoma ([place])  09/07/2021 Imaging   CT CAP IMPRESSION: 1. Liver cirrhosis. Multiple hepatic masses including large at least 14.5 cm dominant right hepatic lobe mass concerning for hepatocellular carcinoma and or metastatic disease. There is portal vein thrombosis. 2. Borderline to mild splenomegaly. Suspicion of upper abdominal varices. Large volume of ascites in the abdomen and pelvis. 3. Slightly dilated gallbladder with gallbladder wall thickening or edema, nonspecific and could be due to liver disease. Correlation with ultrasound if clinically appropriate. 4. Mild ground-glass density in the right lower lobe, possible pneumonia   09/08/2021 Imaging   ABD US IMPRESSION: 1. Gallbladder wall thickening without cholelithiasis. Differential considerations include acalculous cholecystitis, but this appearance can be seen in the setting of hepatocellular disease and ascites as is present in this patient. 2. Hepatocellular disease with multiple hepatic masses with the largest measuring 4.8 x 4.2 cm. Overall appearance is concerning for malignancy until proven otherwise. Recommend further evaluation with an MRI of the abdomen without and with intravenous contrast. 3. Echogenic material within the portal vein concerning for nonocclusive thrombus.   09/08/2021 Imaging   MR abdomen with/without - IMPRESSION: 1. Moderate to markedly motion degraded exam. Per technologist notes, the patient could not follow breath hold directions secondary to language barrier. 2. Findings most consistent with multifocal hepatocellular carcinoma in the setting of cirrhosis. Partial portal vein thrombosis, as delineated above. 3. Moderate volume ascites. 4. Right lower lobe pneumonia.   09/08/2021 Procedure   IR paracentesis IMPRESSION: Successful ultrasound-guided diagnostic and therapeutic paracentesis yielding 1.3 liters of  peritoneal fluid.   09/08/2021 Pathology Results   Specimen Submitted:  A. ASCITES, PARACENTESIS:  FINAL MICROSCOPIC DIAGNOSIS:  - No malignant cells identified    09/08/2021 Cancer Staging   Staging form: Liver, AJCC 8th Edition - Clinical stage from 09/08/2021: Stage IIIA (cT3, cN0, cM0) - Signed by Truitt Merle, MD on 10/01/2021 Stage prefix: Initial diagnosis    09/09/2021 Tumor Marker   AFP: 122,267 ng/mL   09/10/2021 Initial Diagnosis   Hepatocellular carcinoma ([place])   10/02/2021 -  Chemotherapy   Patient is on Treatment Plan : LUNG Atezolizumab + Bevacizumab q21d Maintenance        INTERVAL HISTORY:  Izaia Doris is here for a follow up of [place]. He was last seen by me on 10/13/21. He presents to the clinic alone.  I used the online video interpreting service.  He was recently hospitalized for encephalopathy, he was unresponsive for a few days, and gradually improved.  He underwent paracentesis with 5 L fluids removed 3 days ago, and was discharged home 2 days ago.  He noticed his abdomen is severely bloated again, he has very low appetite, able to drink fluids adequately, very fatigued.  He has been using lactulose, has loose bowel movement 3-4 times a day, has intermittent hematochezia.  He also complains of bilateral lower extremity edema.  No chest pain.    All other systems were reviewed with the patient and are negative.  MEDICAL HISTORY:  Past Medical History:  Diagnosis Date   CAP (community acquired pneumonia)    Cirrhosis ([place])    Hepatitis C virus infection  Hepatocellular carcinoma [facility])     SURGICAL HISTORY: Past Surgical History:  Procedure Laterality Date   BIOPSY  09/29/2021   Procedure: BIOPSY;  Surgeon: Rush Landmark Telford Nab., MD;  Location: [facility];  Service: Gastroenterology;;   ESOPHAGEAL BANDING  09/29/2021   Procedure: ESOPHAGEAL BANDING;  Surgeon: Irving Copas., MD;  Location: [facility];  Service: Gastroenterology;;    ESOPHAGOGASTRODUODENOSCOPY (EGD) WITH PROPOFOL N/A 09/29/2021   Procedure: ESOPHAGOGASTRODUODENOSCOPY (EGD) WITH PROPOFOL;  Surgeon: Irving Copas., MD;  Location: [facility];  Service: Gastroenterology;  Laterality: N/A;   IR PARACENTESIS  09/08/2021   IR PARACENTESIS  10/04/2021    I have reviewed the social history and family history with the patient and they are unchanged from previous note.  ALLERGIES:  has No Known Allergies.  MEDICATIONS:  Current Outpatient Medications  Medication Sig Dispense Refill   Amoxicill-Rifabutin-Omeprazole (TALICIA) 229-79.8-92 MG CPDR Take 4 capsules by mouth every 8 (eight) hours. 168 capsule 0   furosemide (LASIX) 20 MG tablet Take 2 tablets (40 mg total) by mouth daily. 60 tablet 0   lactulose (CHRONULAC) 10 GM/15ML solution Take 30 mLs (20 g total) by mouth 3 (three) times daily. Take at least once daily for a goal of 3 BM's a day.  Titrate for 3 bowel movements a day. 2700 mL 0   Morphine Sulfate (MORPHINE CONCENTRATE) 10 mg / 0.5 ml concentrated solution Take 0.5-1 mLs (10-20 mg total) by mouth every 2 (two) hours as needed for severe pain. 240 mL 0   omeprazole (PRILOSEC) 40 MG capsule Take 1 capsule (40 mg total) by mouth 2 (two) times daily before a meal. 60 capsule 6   ondansetron (ZOFRAN) 4 MG tablet Take 1 tablet (4 mg total) by mouth every 6 (six) hours as needed for nausea or vomiting. 30 tablet 0   spironolactone (ALDACTONE) 100 MG tablet Take 1 tablet (100 mg total) by mouth daily. 30 tablet 0   No current facility-administered medications for this visit.    PHYSICAL EXAMINATION: ECOG PERFORMANCE STATUS: 3 - Symptomatic, >50% confined to bed  There were no vitals filed for this visit. Wt Readings from Last 3 Encounters:  10/21/21 141 lb 8.6 oz (64.2 kg)  10/13/21 142 lb 8 oz (64.6 kg)  10/02/21 128 lb 8 oz (58.3 kg)     GENERAL:alert, oriented, moderate distress due to significant abdominal distention. SKIN: skin color,  texture, turgor are normal, no rashes or significant lesions EYES: normal, Conjunctiva are pink and non-injected, sclera clear NECK: supple, thyroid normal size, non-tender, without nodularity LYMPH:  no palpable lymphadenopathy in the cervical, axillary  LUNGS: clear to auscultation and percussion with normal breathing effort HEART: regular rate & rhythm and no murmurs and no lower extremity edema ABDOMEN:abdomen extremely distended, with diffuse tenderness.  (+) ascites) Musculoskeletal:no cyanosis of digits and no clubbing, (+) b/l leg edema  NEURO: alert & oriented x 3 with fluent speech, no focal motor/sensory deficits  LABORATORY DATA:  I have reviewed the data as listed CBC Latest Ref Rng & Units 10/23/2021 10/21/2021 10/20/2021  WBC 4.0 - 10.5 K/uL 11.8(H) 8.7 7.4  Hemoglobin 13.0 - 17.0 g/dL 11.4(L) 11.5(L) 12.3(L)  Hematocrit 39.0 - 52.0 % 32.1(L) 33.3(L) 34.1(L)  Platelets 150 - 400 K/uL 124(L) 135(L) 128(L)     CMP Latest Ref Rng & Units 10/23/2021 10/21/2021 10/20/2021  Glucose 70 - 99 mg/dL 125(H) 118(H) 111(H)  BUN 6 - 20 mg/dL 21(H) 18 19  Creatinine 0.61 - 1.24 mg/dL 0.77 0.69  0.66  Sodium 135 - 145 mmol/L 126(L) 129(L) 131(L)  Potassium 3.5 - 5.1 mmol/L 4.8 3.2(L) 3.4(L)  Chloride 98 - 111 mmol/L 100 100 102  CO2 22 - 32 mmol/L 22 22 21(L)  Calcium 8.9 - 10.3 mg/dL 7.9(L) 7.5(L) 7.8(L)  Total Protein 6.5 - 8.1 g/dL 8.3(H) 7.4 7.1  Total Bilirubin 0.3 - 1.2 mg/dL 8.7(HH) 7.1(H) 7.4(H)  Alkaline Phos 38 - 126 U/L 260(H) 203(H) 193(H)  AST 15 - 41 U/L 370(HH) 409(H) 434(H)  ALT 0 - 44 U/L 204(H) 207(H) 219(H)      RADIOGRAPHIC STUDIES: I have personally reviewed the radiological images as listed and agreed with the findings in the report. No results found.    No orders of the defined types were placed in this encounter.  All questions were answered. The patient knows to call the clinic with any problems, questions or concerns. No barriers to learning was  detected. The total time spent in the appointment was 30 minutes.     Truitt Merle, MD 10/23/2021   I, Wilburn Mylar, am acting as scribe for Truitt Merle, MD.   I have reviewed the above documentation for accuracy and completeness, and I agree with the above.

## 2021-10-24 ENCOUNTER — Telehealth: Payer: Self-pay

## 2021-10-24 ENCOUNTER — Ambulatory Visit (HOSPITAL_COMMUNITY)
Admission: RE | Admit: 2021-10-24 | Discharge: 2021-10-24 | Disposition: A | Discharge status: Home / Self Care | Payer: BC Managed Care – PPO | Source: Ambulatory Visit | Attending: Hematology | Admitting: Hematology

## 2021-10-24 ENCOUNTER — Other Ambulatory Visit: Payer: Self-pay

## 2021-10-24 ENCOUNTER — Other Ambulatory Visit: Payer: Self-pay | Admitting: Hematology

## 2021-10-24 ENCOUNTER — Encounter: Payer: Self-pay | Admitting: Hematology

## 2021-10-24 DIAGNOSIS — C220 Liver cell carcinoma: Secondary | ICD-10-CM | POA: Diagnosis not present

## 2021-10-24 LAB — T4: T4, Total: 8.4 ug/dL (ref 4.5–12.0)

## 2021-10-24 LAB — BODY FLUID CULTURE W GRAM STAIN: Culture: NO GROWTH

## 2021-10-24 LAB — PATHOLOGIST SMEAR REVIEW

## 2021-10-24 MED ORDER — LIDOCAINE HCL 1 % IJ SOLN
INTRAMUSCULAR | Status: AC
  Administered 2021-10-24: 15 mL
  Filled 2021-10-24: qty 20

## 2021-10-24 NOTE — Telephone Encounter (Signed)
Notified Patient's Pharmacy [facility] [phone] [place] of prior authorization approval for Morphine Sulfate Concentrate 100mg /26ml

## 2021-10-24 NOTE — Progress Notes (Signed)
[place]  [phone] [phone]   Name: Arlie Riker Date: 10/24/2021 MRN: [id number]  DOB: October 17, 1970  Patient Care Team: Patient, No Pcp Per (Inactive) as PCP - General ([place]) Truitt Merle, MD as Consulting Physician (Hematology) Alla Feeling, NP as Nurse Practitioner (Nurse Practitioner) Hilarie Fredrickson, Lajuan Lines, MD as Consulting Physician (Gastroenterology)    REASON FOR CONSULTATION: Deegan Valentino is a 51 y.o. male with medical history including untreated hepatitis C, liver cirrhosis, recurrent malignant ascites, with recent diagnosis (09/08/21) of hepatocellular carcinoma (multiple bilateral liver masses). Currently on immunotherapy (Tecentriq) and Avastin. Palliative ask to see for symptom management and goals of care.    SOCIAL HISTORY:     reports that he has never smoked. He does not have any smokeless tobacco history on file. He reports that he does not currently use alcohol. He reports that he does not use drugs.  ADVANCE DIRECTIVES:  Patient does not have a completed advanced directive, however does have packet. He does not have any family in the [place]. States his medical decision makers would be his 2 close friends [name] (primary) and [employer] (secondary).   CODE STATUS: DNR  PAST MEDICAL HISTORY: Past Medical History:  Diagnosis Date   CAP (community acquired pneumonia)    Cirrhosis ([place])    Hepatitis C virus infection    Hepatocellular carcinoma ([place])     PAST SURGICAL HISTORY:  Past Surgical History:  Procedure Laterality Date   BIOPSY  09/29/2021   Procedure: BIOPSY;  Surgeon: Mansouraty, Telford Nab., MD;  Location: [facility];  Service: Gastroenterology;;   ESOPHAGEAL BANDING  09/29/2021   Procedure: ESOPHAGEAL BANDING;  Surgeon: Irving Copas., MD;  Location: [facility];  Service: Gastroenterology;;   ESOPHAGOGASTRODUODENOSCOPY (EGD) WITH PROPOFOL N/A  09/29/2021   Procedure: ESOPHAGOGASTRODUODENOSCOPY (EGD) WITH PROPOFOL;  Surgeon: Irving Copas., MD;  Location: [facility];  Service: Gastroenterology;  Laterality: N/A;   IR PARACENTESIS  09/08/2021   IR PARACENTESIS  10/04/2021    HEMATOLOGY/ONCOLOGY HISTORY:  Oncology History  Hepatocellular carcinoma ([place])  09/07/2021 Imaging   CT CAP IMPRESSION: 1. Liver cirrhosis. Multiple hepatic masses including large at least 14.5 cm dominant right hepatic lobe mass concerning for hepatocellular carcinoma and or metastatic disease. There is portal vein thrombosis. 2. Borderline to mild splenomegaly. Suspicion of upper abdominal varices. Large volume of ascites in the abdomen and pelvis. 3. Slightly dilated gallbladder with gallbladder wall thickening or edema, nonspecific and could be due to liver disease. Correlation with ultrasound if clinically appropriate. 4. Mild ground-glass density in the right lower lobe, possible pneumonia   09/08/2021 Imaging   ABD US IMPRESSION: 1. Gallbladder wall thickening without cholelithiasis. Differential considerations include acalculous cholecystitis, but this appearance can be seen in the setting of hepatocellular disease and ascites as is present in this patient. 2. Hepatocellular disease with multiple hepatic masses with the largest measuring 4.8 x 4.2 cm. Overall appearance is concerning for malignancy until proven otherwise. Recommend further evaluation with an MRI of the abdomen without and with intravenous contrast. 3. Echogenic material within the portal vein concerning for nonocclusive thrombus.   09/08/2021 Imaging   MR abdomen with/without - IMPRESSION: 1. Moderate to markedly motion degraded exam. Per technologist notes, the patient could not follow breath hold directions secondary to language barrier. 2. Findings most consistent with multifocal hepatocellular carcinoma in the setting of cirrhosis. Partial portal vein thrombosis, as delineated  above. 3. Moderate volume ascites. 4. Right  lower lobe pneumonia.   09/08/2021 Procedure   IR paracentesis IMPRESSION: Successful ultrasound-guided diagnostic and therapeutic paracentesis yielding 1.3 liters of peritoneal fluid.   09/08/2021 Pathology Results   Specimen Submitted:  A. ASCITES, PARACENTESIS:  FINAL MICROSCOPIC DIAGNOSIS:  - No malignant cells identified    09/08/2021 Cancer Staging   Staging form: Liver, AJCC 8th Edition - Clinical stage from 09/08/2021: Stage IIIA (cT3, cN0, cM0) - Signed by Truitt Merle, MD on 10/01/2021 Stage prefix: Initial diagnosis    09/09/2021 Tumor Marker   AFP: 122,267 ng/mL   09/10/2021 Initial Diagnosis   Hepatocellular carcinoma ([place])   10/02/2021 -  Chemotherapy   Patient is on Treatment Plan : LUNG Atezolizumab + Bevacizumab q21d Maintenance       ALLERGIES:  has No Known Allergies.  MEDICATIONS:  Current Outpatient Medications  Medication Sig Dispense Refill   Morphine Sulfate (MORPHINE CONCENTRATE) 10 mg / 0.5 ml concentrated solution Take 0.5-1 mLs (10-20 mg total) by mouth every 2 (two) hours as needed for severe pain. 240 mL 0   Amoxicill-Rifabutin-Omeprazole (TALICIA) 557-32.2-02 MG CPDR Take 4 capsules by mouth every 8 (eight) hours. 168 capsule 0   furosemide (LASIX) 20 MG tablet Take 2 tablets (40 mg total) by mouth daily. 60 tablet 0   lactulose (CHRONULAC) 10 GM/15ML solution Take 30 mLs (20 g total) by mouth 3 (three) times daily. Take at least once daily for a goal of 3 BM's a day.  Titrate for 3 bowel movements a day. 2700 mL 0   omeprazole (PRILOSEC) 40 MG capsule Take 1 capsule (40 mg total) by mouth 2 (two) times daily before a meal. 60 capsule 6   ondansetron (ZOFRAN) 4 MG tablet Take 1 tablet (4 mg total) by mouth every 6 (six) hours as needed for nausea or vomiting. 30 tablet 0   spironolactone (ALDACTONE) 100 MG tablet Take 1 tablet (100 mg total) by mouth daily. 30 tablet 0   No current facility-administered  medications for this visit.    VITAL SIGNS: There were no vitals taken for this visit. There were no vitals filed for this visit.  Estimated body mass index is 23.55 kg/m as calculated from the following:   Height as of 10/17/21: 5\' 5"  (1.651 m).   Weight as of 10/21/21: 141 lb 8.6 oz (64.2 kg).  LABS: CBC:    Component Value Date/Time   WBC 11.8 (H) 10/23/2021 0857   WBC 8.7 10/21/2021 0334   HGB 11.4 (L) 10/23/2021 0857   HCT 32.1 (L) 10/23/2021 0857   PLT 124 (L) 10/23/2021 0857   MCV 97.3 10/23/2021 0857   MCV 97.7 (A) 05/28/2013 1038   NEUTROABS 7.5 10/23/2021 0857   LYMPHSABS 2.3 10/23/2021 0857   MONOABS 1.2 (H) 10/23/2021 0857   EOSABS 0.1 10/23/2021 0857   BASOSABS 0.1 10/23/2021 0857   Comprehensive Metabolic Panel:    Component Value Date/Time   NA 126 (L) 10/23/2021 0857   K 4.8 10/23/2021 0857   CL 100 10/23/2021 0857   CO2 22 10/23/2021 0857   BUN 21 (H) 10/23/2021 0857   CREATININE 0.77 10/23/2021 0857   CREATININE 0.67 05/28/2013 1027   GLUCOSE 125 (H) 10/23/2021 0857   CALCIUM 7.9 (L) 10/23/2021 0857   AST 370 (HH) 10/23/2021 0857   ALT 204 (H) 10/23/2021 0857   ALKPHOS 260 (H) 10/23/2021 0857   BILITOT 8.7 (HH) 10/23/2021 0857   PROT 8.3 (H) 10/23/2021 0857   ALBUMIN 2.0 (L) 10/23/2021 5427  RADIOGRAPHIC STUDIES: CT HEAD WO CONTRAST  Result Date: 10/16/2021 CLINICAL DATA:  Mental status change EXAM: CT HEAD WITHOUT CONTRAST TECHNIQUE: Contiguous axial images were obtained from the base of the skull through the vertex without intravenous contrast. RADIATION DOSE REDUCTION: This exam was performed according to the departmental dose-optimization program which includes automated exposure control, adjustment of the mA and/or kV according to patient size and/or use of iterative reconstruction technique. COMPARISON:  CT brain report 08/19/2009 FINDINGS: Brain: No evidence of acute infarction, hemorrhage, hydrocephalus, extra-axial collection or mass  lesion/mass effect. Vascular: No hyperdense vessel or unexpected calcification. Skull: Normal. Negative for fracture or focal lesion. Sinuses/Orbits: No acute finding. Other: None IMPRESSION: Negative non contrasted CT appearance of the brain. Electronically Signed   By: Donavan Foil M.D.   On: 10/16/2021 20:40   US Paracentesis  Result Date: 10/20/2021 INDICATION: Patient with history hepatocellular carcinoma with recurrent ascites. Request is for therapeutic paracentesis. EXAM: ULTRASOUND GUIDED THERAPEUTIC  PARACENTESIS MEDICATIONS: Lidocaine 1% 10 mL COMPLICATIONS: None immediate. PROCEDURE: Informed written consent was obtained from the patient after a discussion of the risks, benefits and alternatives to treatment. A timeout was performed prior to the initiation of the procedure. Initial ultrasound scanning demonstrates a moderate amount of ascites within the right lower abdominal quadrant. The right lower abdomen was prepped and draped in the usual sterile fashion. 1% lidocaine was used for local anesthesia. Following this, a 19 gauge, 7-cm, Yueh catheter was introduced. An ultrasound image was saved for documentation purposes. The paracentesis was performed. The catheter was removed and a dressing was applied. The patient tolerated the procedure well without immediate post procedural complication. FINDINGS: A total of approximately 4.8 L of straw colored fluid was removed. IMPRESSION: Successful ultrasound-guided paracentesis yielding 4.8 liters of peritoneal fluid. Read by: Rushie Nyhan, NP Electronically Signed   By: Lucrezia Europe M.D.   On: 10/20/2021 16:35   US Paracentesis  Result Date: 10/18/2021 INDICATION: Patient with history of hepatocellular carcinoma with recurrent ascites. Request for IR to perform diagnostic and therapeutic paracentesis with a 2 L max. EXAM: ULTRASOUND GUIDED DIAGNOSTIC AND THERAPEUTIC LEFT LOWER QUADRANT PARACENTESIS MEDICATIONS: 10 mL 1 % lidocaine COMPLICATIONS:  None immediate. PROCEDURE: Informed written consent was obtained from the patient after a discussion of the risks, benefits and alternatives to treatment. A timeout was performed prior to the initiation of the procedure. Initial ultrasound scanning demonstrates a large amount of ascites within the left lower abdominal quadrant. The left lower abdomen was prepped and draped in the usual sterile fashion. 1% lidocaine was used for local anesthesia. Following this, a 19 gauge, 7-cm, Yueh catheter was introduced. An ultrasound image was saved for documentation purposes. The paracentesis was performed. The catheter was removed and a dressing was applied. The patient tolerated the procedure well without immediate post procedural complication. FINDINGS: A total of approximately 2 L of clear, yellow fluid was removed. Samples were sent to the laboratory as requested by the clinical team. IMPRESSION: Successful ultrasound-guided paracentesis yielding 2 liters of peritoneal fluid. Read by: Narda Rutherford, AGNP-BC Electronically Signed   By: Corrie Mckusick D.O.   On: 10/18/2021 16:45   DG CHEST PORT 1 VIEW  Result Date: 10/18/2021 CLINICAL DATA:  Altered mental status with acute metabolic encephalopathy. EXAM: PORTABLE CHEST 1 VIEW COMPARISON:  Chest radiograph dated 10/16/2021. FINDINGS: The patient is rotated to the left which limits the sensitivity of the exam. Left basilar opacities may represent airspace opacities or overlying cardiac shadow. The right lung  is clear. There is no pneumothorax or pleural effusion. The osseous structures appear intact. IMPRESSION: Left basilar opacities may represent airspace opacities or overlying cardiac shadow given patient positioning. Electronically Signed   By: Zerita Boers M.D.   On: 10/18/2021 10:21   DG Chest Port 1 View  Result Date: 10/16/2021 CLINICAL DATA:  Altered mental status. EXAM: PORTABLE CHEST 1 VIEW COMPARISON:  None. FINDINGS: Mild, stable right-sided volume loss is  seen with mild, stable atelectatic changes noted within the right lung base. There is no evidence of an acute infiltrate, pleural effusion or pneumothorax. The heart size and mediastinal contours are within normal limits. The visualized skeletal structures are unremarkable. IMPRESSION: Stable right-sided volume loss with mild, stable right basilar atelectasis. Electronically Signed   By: Virgina Norfolk M.D.   On: 10/16/2021 19:38   EEG adult  Result Date: 10/20/2021 Lora Havens, MD     10/20/2021 11:12 AM Patient Name: Kensington Duerst MRN: [id number] Epilepsy Attending: Lora Havens Referring Physician/Provider: Elodia Florence., MD Date: 10/19/2021 Duration: 22.14 mins Patient history: 51 year old male with altered mental status.  EEG to evaluate for seizure. Level of alertness: Awake AEDs during EEG study: None Technical aspects: This EEG study was done with scalp electrodes positioned according to the 10-20 International system of electrode placement. Electrical activity was acquired at a sampling rate of 500Hz  and reviewed with a high frequency filter of 70Hz  and a low frequency filter of 1Hz . EEG data were recorded continuously and digitally stored. Description: No clear posterior dominant rhythm was seen.  EEG showed continuous generalized 3 to 5 Hz theta- delta slowing.  Intermittent generalized triphasic waves were noted.  Hyperventilation and photic stimulation were not performed.   ABNORMALITY - Continuous slow, generalized -Triphasic wave, generalized IMPRESSION: This study is suggestive of moderate diffuse encephalopathy, nonspecific etiology but most likely secondary to toxic-metabolic causes.  No seizures or definite epileptiform discharges were seen throughout the recording. Priyanka [name]   IR Paracentesis  Result Date: 10/04/2021 INDICATION: Patient with a history of hepatocellular carcinoma with recurrent ascites presents today for therapeutic paracentesis. EXAM:  ULTRASOUND GUIDED PARACENTESIS MEDICATIONS: 1% lidocaine 10 mL COMPLICATIONS: None immediate. PROCEDURE: Informed written consent was obtained from the patient after a discussion of the risks, benefits and alternatives to treatment. A timeout was performed prior to the initiation of the procedure. Initial ultrasound scanning demonstrates a large amount of ascites within the right lower abdominal quadrant. The right lower abdomen was prepped and draped in the usual sterile fashion. 1% lidocaine was used for local anesthesia. Following this, a 19 gauge, 7-cm, Yueh catheter was introduced. An ultrasound image was saved for documentation purposes. The paracentesis was performed. The catheter was removed and a dressing was applied. The patient tolerated the procedure well without immediate post procedural complication. FINDINGS: A total of approximately 2.4 L of clear yellow fluid was removed. IMPRESSION: Successful ultrasound-guided paracentesis yielding 2.4 liters of peritoneal fluid. Read by: Soyla Dryer, NP Electronically Signed   By: Corrie Mckusick D.O.   On: 10/04/2021 15:41    PERFORMANCE STATUS (ECOG) : 3 - Symptomatic, >50% confined to bed  Review of Systems  Constitutional:  Positive for appetite change, fatigue and unexpected weight change.  Respiratory:  Positive for shortness of breath.   Gastrointestinal:  Positive for abdominal distention, abdominal pain and blood in stool.  Neurological:  Positive for weakness.  Unless otherwise noted, a complete review of systems is negative.  Physical Exam General: Weak, cachectic,  ill-appearing Cardiovascular: Tachycardic Pulmonary: diminished  Abdomen: firm, grossly distended, tender  Extremities: bilateral lower extremity 3+ edema  Skin: thin, dry, muscle wasting, temporal wasting  Neurological: Weakness but otherwise nonfocal, with interpretation   IMPRESSION: This is my initial visit with Mr. Denardo. He is also seeing Dr. Burr Medico. Appears  uncomfortable and ill. Cachectic with muscle/temporal wasting. Of note he recently was discharged from [facility] where he also tested positive for COVID-19. Restrictions until October 27, 2021.   I initially used [place] interpretation via Ipad. 30 min into visit, interpretor [employer]) arrived and video interpretation discontinued.   I introduced myself, Advertising copywriter, and Palliative's role in collaboration with the oncology team. Concept of Palliative Care was introduced as specialized medical care for people and their families living with serious illness.  It focuses on providing relief from the symptoms and stress of a serious illness.  The goal is to improve quality of life for both the patient and the family. Values and goals of care important to patient and family were attempted to be elicited.   Mr. Baney shares all of his family remains in [place]. He moved to [facility] in 2008 and lives with his friend. He has no children.   States he requires some assistance in the home. His roommate works and is a Ship broker. Andric is usually alone from about 9a-2pm. During that time he stays in the bed and most likely sleeping.   We discussed Her current illness and what it means in the larger context of Her on-going co-morbidities. Natural disease trajectory and expectations were discussed. Dr. Burr Medico has provided detailed updates to patient with understanding that his cancer is not curative. Patient verbalizes to me his understanding.   He continues to struggle with recurrent malignant ascites. Plans for paracentesis tomorrow and will schedule PleurX catheter placement for next available date.   Extensive education provided on continued medical treatment versus focusing on his comfort knowing his health continues to decline. He does not wish to suffer. He expressed wishes for home hospice support with focus solely on his symptoms. He is concerned about abdominal pain and ascites. Continues to lift  shirt and pointing to his grossly enlarged and tender abdomen.   Education provided at length on outpatient hospice support in the home in addition to residential hospice if symptoms unable to be managed in the home. Bart verbalized understanding.   I discussed at length focusing on his comfort in the home with hospice support he  would no longer receive aggressive medical interventions such as lab work, follow-up visits here in the office (however with understanding we are available if needed), radiology testing (understands he will have paracentesis tomorrow and PleurX cath placed), or medications not focused on comfort. All care would focus on how the patient is looking and feeling. This would include management of any symptoms that may cause discomfort, pain, shortness of breath, cough, nausea, agitation, anxiety, and/or secretions etc. Symptoms would be managed with medications and other non-pharmacological interventions such as spiritual support if requested, repositioning, music therapy, or therapeutic listening. Mr. Iodice verbalized understanding and appreciation.   He is requested hospice referral. He would like his friend [name] to be his primary contact as he speaks fluent [place].   Patient confirms wishes for DNR/DNI. No life-sustaining measures. I introduced MOST form and education provided. Patient initially was interested in completing documents however to advisement of interpretor he chose to forgo completion.    I discussed use of Roxanol for his  abdominal pain. He is appreciative knowing he will have this available.   I discussed the importance of continued conversation with family and their medical providers regarding overall plan of care and treatment options, ensuring decisions are within the context of the patients values and GOCs.  PLAN: DNR/DNI as confirmed by patient.   Extensive education provided on outpatient hospice services (in-home vs hospice facility). He  has requested referral with his friend [name] as main contact. Referral  has been placed with [facility].  I have spoken with patient's friend, [name] (as requested by Mr. Cizek) and provided updates.  Roxanol 10-20mg  every 2hrs as needed for pain.  Scheduled for paracentesis on 10/24/2021.  Patient is aware to arrive for his appointment by 1:15 PM.   Patient has been scheduled for Pleurx catheter placement on October 31, 2021 at 11:30 AM.  Patient and his friend [name] has been made aware of appointment.   Patient understands he does not require any office follow-up however the medical team is available as needed.   Patient expressed understanding and was in agreement with this plan. He also understands that He can call the clinic at any time with any questions, concerns, or complaints.   Time Total: 65 min.   Visit consisted of counseling and education dealing with the complex and emotionally intense issues of symptom management and palliative care in the setting of serious and potentially life-threatening illness.Greater than 50%  of this time was spent counseling and coordinating care related to the above assessment and plan.  Signed by: Alda Lea, AGPCNP-BC Palliative Medicine Team/[place]

## 2021-10-24 NOTE — Procedures (Signed)
PROCEDURE SUMMARY:  Successful image-guided paracentesis from the right upper abdomen.  Yielded 4.0 liters of clear yellow fluid.  No immediate complications.  EBL <  1 mL Patient tolerated well.   Specimen was not sent for labs.  Please see imaging section of Epic for full dictation.  Joaquim Nam PA-C 10/24/2021 3:41 PM

## 2021-10-25 ENCOUNTER — Encounter (HOSPITAL_COMMUNITY): Payer: Self-pay

## 2021-10-25 ENCOUNTER — Inpatient Hospital Stay (HOSPITAL_COMMUNITY)
Admission: EM | Admit: 2021-10-25 | Discharge: 2021-11-01 | DRG: 371 | Disposition: A | Discharge status: Hospice | Payer: BC Managed Care – PPO | Attending: Internal Medicine | Admitting: Internal Medicine

## 2021-10-25 ENCOUNTER — Other Ambulatory Visit: Payer: Self-pay | Admitting: Nurse Practitioner

## 2021-10-25 ENCOUNTER — Other Ambulatory Visit: Payer: Self-pay

## 2021-10-25 DIAGNOSIS — R64 Cachexia: Secondary | ICD-10-CM | POA: Diagnosis present

## 2021-10-25 DIAGNOSIS — C220 Liver cell carcinoma: Secondary | ICD-10-CM | POA: Diagnosis present

## 2021-10-25 DIAGNOSIS — E875 Hyperkalemia: Secondary | ICD-10-CM | POA: Diagnosis present

## 2021-10-25 DIAGNOSIS — R109 Unspecified abdominal pain: Secondary | ICD-10-CM | POA: Diagnosis present

## 2021-10-25 DIAGNOSIS — E43 Unspecified severe protein-calorie malnutrition: Secondary | ICD-10-CM | POA: Diagnosis present

## 2021-10-25 DIAGNOSIS — R7989 Other specified abnormal findings of blood chemistry: Secondary | ICD-10-CM

## 2021-10-25 DIAGNOSIS — K652 Spontaneous bacterial peritonitis: Secondary | ICD-10-CM | POA: Diagnosis present

## 2021-10-25 DIAGNOSIS — R180 Malignant ascites: Secondary | ICD-10-CM | POA: Diagnosis present

## 2021-10-25 DIAGNOSIS — K7460 Unspecified cirrhosis of liver: Secondary | ICD-10-CM | POA: Diagnosis present

## 2021-10-25 DIAGNOSIS — Z66 Do not resuscitate: Secondary | ICD-10-CM | POA: Diagnosis present

## 2021-10-25 DIAGNOSIS — R188 Other ascites: Secondary | ICD-10-CM

## 2021-10-25 DIAGNOSIS — K7210 Chronic hepatic failure without coma: Secondary | ICD-10-CM | POA: Diagnosis present

## 2021-10-25 DIAGNOSIS — Z603 Acculturation difficulty: Secondary | ICD-10-CM | POA: Diagnosis present

## 2021-10-25 DIAGNOSIS — I8510 Secondary esophageal varices without bleeding: Secondary | ICD-10-CM | POA: Diagnosis present

## 2021-10-25 DIAGNOSIS — Z515 Encounter for palliative care: Secondary | ICD-10-CM | POA: Diagnosis not present

## 2021-10-25 DIAGNOSIS — K7290 Hepatic failure, unspecified without coma: Secondary | ICD-10-CM | POA: Diagnosis present

## 2021-10-25 DIAGNOSIS — B182 Chronic viral hepatitis C: Secondary | ICD-10-CM | POA: Diagnosis present

## 2021-10-25 DIAGNOSIS — Z79899 Other long term (current) drug therapy: Secondary | ICD-10-CM

## 2021-10-25 DIAGNOSIS — E871 Hypo-osmolality and hyponatremia: Secondary | ICD-10-CM | POA: Diagnosis present

## 2021-10-25 DIAGNOSIS — Z7189 Other specified counseling: Secondary | ICD-10-CM | POA: Diagnosis not present

## 2021-10-25 DIAGNOSIS — Z6821 Body mass index (BMI) 21.0-21.9, adult: Secondary | ICD-10-CM | POA: Diagnosis not present

## 2021-10-25 LAB — CBC WITH DIFFERENTIAL/PLATELET
Abs Immature Granulocytes: 0.34 10*3/uL — ABNORMAL HIGH (ref 0.00–0.07)
Basophils Absolute: 0.1 10*3/uL (ref 0.0–0.1)
Basophils Relative: 1 %
Eosinophils Absolute: 0 10*3/uL (ref 0.0–0.5)
Eosinophils Relative: 0 %
HCT: 32.6 % — ABNORMAL LOW (ref 39.0–52.0)
Hemoglobin: 11.3 g/dL — ABNORMAL LOW (ref 13.0–17.0)
Immature Granulocytes: 2 %
Lymphocytes Relative: 12 %
Lymphs Abs: 2.3 10*3/uL (ref 0.7–4.0)
MCH: 34.7 pg — ABNORMAL HIGH (ref 26.0–34.0)
MCHC: 34.7 g/dL (ref 30.0–36.0)
MCV: 100 fL (ref 80.0–100.0)
Monocytes Absolute: 1 10*3/uL (ref 0.1–1.0)
Monocytes Relative: 5 %
Neutro Abs: 15 10*3/uL — ABNORMAL HIGH (ref 1.7–7.7)
Neutrophils Relative %: 80 %
Platelets: 136 10*3/uL — ABNORMAL LOW (ref 150–400)
RBC: 3.26 MIL/uL — ABNORMAL LOW (ref 4.22–5.81)
RDW: 16.7 % — ABNORMAL HIGH (ref 11.5–15.5)
WBC: 18.7 10*3/uL — ABNORMAL HIGH (ref 4.0–10.5)
nRBC: 0.1 % (ref 0.0–0.2)

## 2021-10-25 LAB — COMPREHENSIVE METABOLIC PANEL WITH GFR
ALT: 203 U/L — ABNORMAL HIGH (ref 0–44)
AST: 419 U/L — ABNORMAL HIGH (ref 15–41)
Albumin: 1.6 g/dL — ABNORMAL LOW (ref 3.5–5.0)
Alkaline Phosphatase: 265 U/L — ABNORMAL HIGH (ref 38–126)
Anion gap: 6 (ref 5–15)
BUN: 22 mg/dL — ABNORMAL HIGH (ref 6–20)
CO2: 20 mmol/L — ABNORMAL LOW (ref 22–32)
Calcium: 7.9 mg/dL — ABNORMAL LOW (ref 8.9–10.3)
Chloride: 95 mmol/L — ABNORMAL LOW (ref 98–111)
Creatinine, Ser: 0.65 mg/dL (ref 0.61–1.24)
GFR, Estimated: >60 mL/min
Glucose, Bld: 88 mg/dL (ref 70–99)
Potassium: 6 mmol/L — ABNORMAL HIGH (ref 3.5–5.1)
Sodium: 121 mmol/L — ABNORMAL LOW (ref 135–145)
Total Bilirubin: 9.8 mg/dL — ABNORMAL HIGH (ref 0.3–1.2)
Total Protein: 8.7 g/dL — ABNORMAL HIGH (ref 6.5–8.1)

## 2021-10-25 LAB — SYNOVIAL CELL COUNT + DIFF, W/ CRYSTALS
Crystals, Fluid: NONE SEEN
Eosinophils-Synovial: 0 % (ref 0–1)
Lymphocytes-Synovial Fld: 11 % (ref 0–20)
Monocyte-Macrophage-Synovial Fluid: 14 % — ABNORMAL LOW (ref 50–90)
Neutrophil, Synovial: 75 % — ABNORMAL HIGH (ref 0–25)
WBC, Synovial: 395 /cu mm — ABNORMAL HIGH (ref 0–200)

## 2021-10-25 LAB — CBG MONITORING, ED: Glucose-Capillary: 72 mg/dL (ref 70–99)

## 2021-10-25 LAB — URINALYSIS, ROUTINE W REFLEX MICROSCOPIC
Glucose, UA: NEGATIVE mg/dL
Hgb urine dipstick: NEGATIVE
Ketones, ur: NEGATIVE mg/dL
Leukocytes,Ua: NEGATIVE
Nitrite: NEGATIVE
Protein, ur: NEGATIVE mg/dL
Specific Gravity, Urine: 1.023 (ref 1.005–1.030)
pH: 5 (ref 5.0–8.0)

## 2021-10-25 LAB — PROTIME-INR
INR: 1.9 — ABNORMAL HIGH (ref 0.8–1.2)
Prothrombin Time: 21.6 s — ABNORMAL HIGH (ref 11.4–15.2)

## 2021-10-25 LAB — AFP TUMOR MARKER

## 2021-10-25 LAB — LIPASE, BLOOD: Lipase: 195 U/L — ABNORMAL HIGH (ref 11–51)

## 2021-10-25 MED ORDER — ONDANSETRON HCL 4 MG PO TABS
4.0000 mg | ORAL_TABLET | Freq: Four times a day (QID) | ORAL | Status: DC | PRN
Start: 2021-10-25 — End: 2021-11-01

## 2021-10-25 MED ORDER — MORPHINE SULFATE (PF) 2 MG/ML IV SOLN
1.0000 mg | INTRAVENOUS | Status: DC | PRN
Start: 2021-10-25 — End: 2021-10-29
  Administered 2021-10-27 – 2021-10-29 (×5): 1 mg via INTRAVENOUS
  Filled 2021-10-25 (×5): qty 1

## 2021-10-25 MED ORDER — SODIUM CHLORIDE 0.9% FLUSH
3.0000 mL | Freq: Two times a day (BID) | INTRAVENOUS | Status: DC
  Administered 2021-10-26 – 2021-11-01 (×13): 3 mL via INTRAVENOUS

## 2021-10-25 MED ORDER — MORPHINE SULFATE (CONCENTRATE) 10 MG /0.5 ML PO SOLN: 10.0000 mg | ORAL | 0 refills | Status: DC | PRN

## 2021-10-25 MED ORDER — ONDANSETRON HCL 4 MG/2ML IJ SOLN: 4.0000 mg | Freq: Four times a day (QID) | INTRAMUSCULAR | Status: DC | PRN

## 2021-10-25 MED ORDER — LACTULOSE 10 GM/15ML PO SOLN
20.0000 g | Freq: Three times a day (TID) | ORAL | Status: DC
  Administered 2021-10-26 – 2021-11-01 (×15): 20 g via ORAL
  Filled 2021-10-25 (×16): qty 30

## 2021-10-25 MED ORDER — INSULIN ASPART 100 UNIT/ML IV SOLN
5.0000 [IU] | Freq: Once | INTRAVENOUS | Status: DC
  Filled 2021-10-25: qty 0.05

## 2021-10-25 MED ORDER — FUROSEMIDE 40 MG PO TABS: 40.0000 mg | ORAL_TABLET | Freq: Every day | ORAL | Status: DC

## 2021-10-25 MED ORDER — SODIUM CHLORIDE 0.9 % IV SOLN
2.0000 g | INTRAVENOUS | Status: DC
  Administered 2021-10-25 – 2021-10-29 (×5): 2 g via INTRAVENOUS
  Filled 2021-10-25 (×5): qty 20

## 2021-10-25 MED ORDER — SODIUM ZIRCONIUM CYCLOSILICATE 10 G PO PACK: 10.0000 g | PACK | Freq: Every day | ORAL | Status: DC

## 2021-10-25 MED ORDER — DEXTROSE 50 % IV SOLN: 1.0000 | Freq: Once | INTRAVENOUS | Status: DC

## 2021-10-25 NOTE — ED Provider Notes (Signed)
[place] DEPT Provider Note   CSN: [id number] Arrival date & time: 10/25/21  1919     History  Chief Complaint  Patient presents with   Abdominal Pain    Patrick Tran is a 51 y.o. male.  Patient with history of hepatocellular carcinoma, recent admission for encephalopathy and paracentesis yesterday, presents back to the ER chief complaint of abdominal swelling, hot abdomen and abdominal pain.  Denies fevers denies vomiting denies cough denies diarrhea.      Home Medications Prior to Admission medications   Medication Sig Start Date End Date Taking? Authorizing Provider  Amoxicill-Rifabutin-Omeprazole (TALICIA) 466-59.9-35 MG CPDR Take 4 capsules by mouth every 8 (eight) hours. 10/03/21   Pyrtle, Lajuan Lines, MD  furosemide (LASIX) 20 MG tablet Take 2 tablets (40 mg total) by mouth daily. 10/21/21 11/20/21  Elodia Florence., MD  lactulose (CHRONULAC) 10 GM/15ML solution Take 30 mLs (20 g total) by mouth 3 (three) times daily. Take at least once daily for a goal of 3 BM's a day.  Titrate for 3 bowel movements a day. 10/21/21 11/20/21  Elodia Florence., MD  Morphine Sulfate (MORPHINE CONCENTRATE) 10 mg / 0.5 ml concentrated solution Take 0.5-1 mLs (10-20 mg total) by mouth every 2 (two) hours as needed for severe pain. 10/25/21   Pickenpack-Cousar, Carlena Sax, NP  omeprazole (PRILOSEC) 40 MG capsule Take 1 capsule (40 mg total) by mouth 2 (two) times daily before a meal. 09/29/21   Mansouraty, Telford Nab., MD  ondansetron (ZOFRAN) 4 MG tablet Take 1 tablet (4 mg total) by mouth every 6 (six) hours as needed for nausea or vomiting. 10/23/21   Pickenpack-Cousar, Carlena Sax, NP  spironolactone (ALDACTONE) 100 MG tablet Take 1 tablet (100 mg total) by mouth daily. 10/21/21 11/20/21  Elodia Florence., MD      Allergies    Patient has no known allergies.    Review of Systems   Review of Systems  Constitutional:  Negative for fever.  HENT:  Negative for  ear pain and sore throat.   Eyes:  Negative for pain.  Respiratory:  Negative for cough.   Cardiovascular:  Negative for chest pain.  Gastrointestinal:  Positive for abdominal distention and abdominal pain.  Genitourinary:  Negative for flank pain.  Musculoskeletal:  Negative for back pain.  Skin:  Negative for color change and rash.  Neurological:  Negative for syncope.  All other systems reviewed and are negative.  Physical Exam Updated Vital Signs BP (!) 142/118    Pulse 98    Temp 98.4 F (36.9 C) (Oral)    Resp (!) 28    Ht 5\' 5"  (1.651 m)    Wt 64.2 kg    SpO2 99%    BMI 23.55 kg/m  Physical Exam Constitutional:      Appearance: He is well-developed.  HENT:     Head: Normocephalic.     Nose: Nose normal.  Eyes:     Extraocular Movements: Extraocular movements intact.  Cardiovascular:     Rate and Rhythm: Normal rate.  Pulmonary:     Effort: Pulmonary effort is normal.  Abdominal:     General: There is distension.     Palpations: There is fluid wave.     Tenderness: There is abdominal tenderness.  Skin:    Coloration: Skin is not jaundiced.  Neurological:     Mental Status: He is alert. Mental status is at baseline.    ED Results / Procedures /  Treatments   Labs (all labs ordered are listed, but only abnormal results are displayed) Labs Reviewed  CBC WITH DIFFERENTIAL/PLATELET - Abnormal; Notable for the following components:      Result Value   WBC 18.7 (*)    RBC 3.26 (*)    Hemoglobin 11.3 (*)    HCT 32.6 (*)    MCH 34.7 (*)    RDW 16.7 (*)    Platelets 136 (*)    Neutro Abs 15.0 (*)    Abs Immature Granulocytes 0.34 (*)    All other components within normal limits  COMPREHENSIVE METABOLIC PANEL - Abnormal; Notable for the following components:   Sodium 121 (*)    Potassium 6.0 (*)    Chloride 95 (*)    CO2 20 (*)    BUN 22 (*)    Calcium 7.9 (*)    Total Protein 8.7 (*)    Albumin 1.6 (*)    AST 419 (*)    ALT 203 (*)    Alkaline Phosphatase  265 (*)    Total Bilirubin 9.8 (*)    All other components within normal limits  LIPASE, BLOOD - Abnormal; Notable for the following components:   Lipase 195 (*)    All other components within normal limits  PROTIME-INR - Abnormal; Notable for the following components:   Prothrombin Time 21.6 (*)    INR 1.9 (*)    All other components within normal limits  URINALYSIS, ROUTINE W REFLEX MICROSCOPIC - Abnormal; Notable for the following components:   Color, Urine AMBER (*)    APPearance HAZY (*)    Bilirubin Urine MODERATE (*)    All other components within normal limits  CULTURE, BLOOD (ROUTINE X 2)  CULTURE, BLOOD (ROUTINE X 2)  BODY FLUID CULTURE W GRAM STAIN  GRAM STAIN  SYNOVIAL CELL COUNT + DIFF, W/ CRYSTALS  GLUCOSE, BODY FLUID OTHER            CBG MONITORING, ED    EKG None  Radiology US Paracentesis  Result Date: 10/24/2021 INDICATION: Patient with history of hepatocellular carcinoma, recurrent ascites. Request for therapeutic paracentesis. EXAM: ULTRASOUND GUIDED THERAPEUTIC PARACENTESIS MEDICATIONS: 10 mL 1% lidocaine COMPLICATIONS: None immediate. PROCEDURE: Informed written consent was obtained from the patient after a discussion of the risks, benefits and alternatives to treatment. A timeout was performed prior to the initiation of the procedure. Initial ultrasound scanning demonstrates a large amount of ascites within the right lower abdominal quadrant. The right lower abdomen was prepped and draped in the usual sterile fashion. 1% lidocaine was used for local anesthesia. Following this, a 19 gauge, 7-cm, Yueh catheter was introduced. An ultrasound image was saved for documentation purposes. The paracentesis was performed. The catheter was removed and a dressing was applied. The patient tolerated the procedure well without immediate post procedural complication. FINDINGS: A total of approximately 4.0 L of clear yellow fluid was removed. IMPRESSION: Successful  ultrasound-guided paracentesis yielding 4.0 liters of peritoneal fluid. Read by Candiss Norse, PA-C Electronically Signed   By: Michaelle Birks M.D.   On: 10/24/2021 17:04    Procedures .Paracentesis  Date/Time: 10/25/2021 10:35 PM Performed by: Luna Fuse, MD Authorized by: Luna Fuse, MD   Comments:     Diagnostic paracentesis performed by myself.  Ultrasound guidance used to locate a pocket of fluid.  Site was prepped with chlorhexidine x3.  Sterile prep and sterile gloves used.  18-gauge needle advanced until clear yellow fluid returned.  Z track technique  applied.  Total of 50 cc removed.  Dressing applied afterwards and no active bleeding noted.    Medications Ordered in ED Medications  cefTRIAXone (ROCEPHIN) 2 g in sodium chloride 0.9 % 100 mL IVPB (has no administration in time range)    ED Course/ Medical Decision Making/ A&P                           Medical Decision Making Amount and/or Complexity of Data Reviewed Labs: ordered.   This patient presents to the ED for concern of abdominal pain, abdominal distention this involves an extensive number of treatment options, and is a complaint that carries with it a high risk of complications and morbidity.  The differential diagnosis includes bacterial peritonitis, severe ascites, sepsis     Co morbidities that complicate the patient evaluation   Hepatocellular carcinoma       Lab Tests:   I Ordered, and personally interpreted labs.  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       Cardiac Monitoring:   Sinus rhythm, regular rate    Test Considered:   No other test considered CT abdomen pelvis considered, but I do not think it related to diagnosis spontaneous bacterial peritonitis.     Critical Interventions:   IV fluids and medications IV Rocephin ordered.     Consultations Obtained:   Medical consultation for admission.      Social Determinants of Health:   Requiring Ecologist.  Moved from one down.           Final Clinical Impression(s) / ED Diagnoses Final diagnoses:  SBP (spontaneous bacterial peritonitis) [facility])    Rx / DC Orders ED Discharge Orders     None         Luna Fuse, MD 10/25/21 2239

## 2021-10-25 NOTE — ED Triage Notes (Signed)
BIB EMS from home for abd distention, has liver failure, comes in to get abd drained  ?

## 2021-10-25 NOTE — H&P (Signed)
History and Physical    [name]:[id number] DOB: Dec 28, 1970 DOA: 10/25/2021  PCP: Patient, No Pcp Per (Inactive)  Patient coming from: Home via EMS  I have personally briefly reviewed patient's old medical records in [place]  Chief Complaint: Abdominal pain  HPI: Patrick Tran is a 51 y.o. 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  History is limited due to language barrier and no remote interpreter available at time of admission.  Patient recently admitted 10/16/2021-10/21/2021 for hepatic encephalopathy in setting of known cirrhosis with ascites.  He underwent paracentesis on 2/22 with 2 L off and 2/24 with removal of 4.8 L.  Cultures were negative.  He was treated with lactulose with resolution of his encephalopathy.  Patient saw oncology, Dr. Burr Medico, in follow-up on 2/27.  Tecentriq was discontinued as he was not considered a candidate for any cancer treatment due to worsening liver failure.  He was seen by palliative care same day who reaffirmed his goals of care including CODE STATUS of DNR and referral to hospice.  He was scheduled for repeat paracentesis on 2/28 and planned for Pleurx catheter placement on 3/7.  Therapeutic paracentesis on 2/28 yielded 4 L of clear yellow fluid.  Patient reported new onset of diffuse abdominal pain afternoon of 3/1.  He felt that his abdomen was hot to touch.  He did have increased epigastric discomfort with radiation to his back.  He has had some nausea and vomiting and decreased urine output.  He has had continued lower extremity edema.  ED Course   Labs/Imaging on admission: I have personally reviewed following labs and imaging studies.  Initial vitals showed BP 135/75,  pulse 91, RR 23, temp 98.4 F, SPO2 98% on room air.  Labs show sodium 121, potassium 6.0, bicarb 20, BUN 22, creatinine 0.65, serum glucose 88, AST 419, ALT 203, alk phos 265, total bilirubin 9.8, lipase 195, WBC 18.7, hemoglobin 11.3, platelets 136,000, INR 1.9.  Blood cultures collected and in process.  Diagnostic paracentesis was performed by EDP with 50 cc removed and sent for culture, Gram stain, cell count with differential.  Patient was given IV ceftriaxone 2 g and the hospitalist service was consulted to admit for further evaluation and management.  Review of Systems: All systems reviewed and are negative except as documented in history of present illness above.   Past Medical History:  Diagnosis Date   CAP (community acquired pneumonia)    Cirrhosis ([place])    Hepatitis C virus infection    Hepatocellular carcinoma ([place])     Past Surgical History:  Procedure Laterality Date   BIOPSY  09/29/2021   Procedure: BIOPSY;  Surgeon: Rush Landmark Telford Nab., MD;  Location: [facility];  Service: Gastroenterology;;   ESOPHAGEAL BANDING  09/29/2021   Procedure: ESOPHAGEAL BANDING;  Surgeon: Irving Copas., MD;  Location: [facility];  Service: Gastroenterology;;   ESOPHAGOGASTRODUODENOSCOPY (EGD) WITH PROPOFOL N/A 09/29/2021   Procedure: ESOPHAGOGASTRODUODENOSCOPY (EGD) WITH PROPOFOL;  Surgeon: Irving Copas., MD;  Location: [facility];  Service: Gastroenterology;  Laterality: N/A;   IR PARACENTESIS  09/08/2021   IR PARACENTESIS  10/04/2021    Social History:  reports that he has never smoked. He does not have any smokeless tobacco history on file. He reports that he does not currently use alcohol. He reports that  he does not use drugs.  No Known Allergies  Family History  Family history unknown: Yes     Prior to Admission medications   Medication Sig Start Date End Date Taking? Authorizing Provider  Amoxicill-Rifabutin-Omeprazole (TALICIA) 309-40.7-68 MG CPDR  Take 4 capsules by mouth every 8 (eight) hours. 10/03/21   Pyrtle, Lajuan Lines, MD  furosemide (LASIX) 20 MG tablet Take 2 tablets (40 mg total) by mouth daily. 10/21/21 11/20/21  Elodia Florence., MD  lactulose (CHRONULAC) 10 GM/15ML solution Take 30 mLs (20 g total) by mouth 3 (three) times daily. Take at least once daily for a goal of 3 BM's a day.  Titrate for 3 bowel movements a day. 10/21/21 11/20/21  Elodia Florence., MD  Morphine Sulfate (MORPHINE CONCENTRATE) 10 mg / 0.5 ml concentrated solution Take 0.5-1 mLs (10-20 mg total) by mouth every 2 (two) hours as needed for severe pain. 10/25/21   Pickenpack-Cousar, Carlena Sax, NP  omeprazole (PRILOSEC) 40 MG capsule Take 1 capsule (40 mg total) by mouth 2 (two) times daily before a meal. 09/29/21   Mansouraty, Telford Nab., MD  ondansetron (ZOFRAN) 4 MG tablet Take 1 tablet (4 mg total) by mouth every 6 (six) hours as needed for nausea or vomiting. 10/23/21   Pickenpack-Cousar, Carlena Sax, NP  spironolactone (ALDACTONE) 100 MG tablet Take 1 tablet (100 mg total) by mouth daily. 10/21/21 11/20/21  Elodia Florence., MD    Physical Exam: Vitals:   10/25/21 2100 10/25/21 2130 10/25/21 2200 10/25/21 2230  BP: 138/75 135/73 (!) 142/118 (!) 142/84  Pulse: 92 91 98 95  Resp: (!) 23 (!) 27 (!) 28 20  Temp:      TempSrc:      SpO2: 98% 98% 99% 98%  Weight:      Height:       Constitutional: Chronically ill-appearing man resting in bed, calm, comfortable Eyes: PERRL, scleral icterus ENMT: Mucous membranes are moist. Posterior pharynx clear of any exudate or lesions.Normal dentition.  Neck: normal, supple, no masses. Respiratory: clear to auscultation bilaterally, no wheezing, no crackles. Normal respiratory effort. No accessory muscle use.  Cardiovascular: Regular rate and rhythm, no murmurs / rubs / gallops.  +2 bilateral lower extremity edema. Abdomen: Distended and taut abdomen, increased warmth to touch with generalized  tenderness. Musculoskeletal: no clubbing / cyanosis. No joint deformity upper and lower extremities. Good ROM, no contractures. Normal muscle tone.  Skin: no rashes, lesions, ulcers. No induration Neurologic: CN 2-12 grossly intact. Sensation intact. Strength 5/5 in all 4.  Psychiatric: Alert and oriented x 3. Normal mood.   EKG: Personally reviewed. Sinus rhythm, rate 93, no peaked T wave changes.  Similar to prior.  Assessment/Plan Principal Problem:   SBP (spontaneous bacterial peritonitis) ([place]) Active Problems:   Hyperkalemia   Hepatic cirrhosis due to chronic hepatitis C infection ([place])   Hepatocellular carcinoma (H[facility])   Hyponatremia   Esophageal varices in cirrhosis ([place])   DNR (do not resuscitate)/DNI(Do Not Intubate)   Ogle Hoque is a 51 y.o. 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  Assessment and Plan: * SBP (spontaneous bacterial peritonitis) ([place])- (present on admission) Presenting with increasing abdominal pain, recurrent abdominal distention after paracentesis 2/28 (4 L removed), leukocytosis.  Diagnostic paracentesis performed in ED, cell count shows 395 WBCs with 75% neutrophils.  Lipase also elevated at 195. -Continue IV ceftriaxone 2 g daily -Follow peritoneal fluid and blood cultures -Continue analgesics as needed  Hyperkalemia- (present on admission) Potassium 6.0 on admission without EKG changes. -Give Lokelma, insulin with D50 -Continue oral Lasix, hold spironolactone  Hepatic cirrhosis due to chronic hepatitis C infection ([place])- (present on admission) Hepatocellular carcinoma Approaching end-stage liver disease.  No longer candidate for anticancer treatment due to worsening liver failure per  oncology.  LFTs remain persistently elevated.  MELD score is 29 which correlates to a 19.6% 11-monthmortality.  Hyponatremia- (present on admission) Sodium 121 in setting of hepatic cirrhosis and ascites.  Esophageal varices in cirrhosis (HCC) S/p banding on 09/29/2021 by GI, Dr. MRush Landmark  DNR (do not resuscitate)/DNI(Do Not Intubate)- (present on admission) Seen by palliative care on 2/27.  DNR status confirmed and he was referred to hospice care.  May benefit from palliative care consult in hospital and transition towards hospice prior to discharge.  DVT prophylaxis: SCDs Code Status: DNR Family Communication: None available on admission. Disposition Plan: From home, anticipate transition to hospice care prior to discharge Consults called: None Severity of Illness: The appropriate patient status for this patient is INPATIENT. Inpatient status is judged to be reasonable and necessary in order to provide the required intensity of service to ensure the patient's safety. The patient's presenting symptoms, physical exam findings, and initial radiographic and laboratory data in the context of their chronic comorbidities is felt to place them at high risk for further clinical deterioration. Furthermore, it is not anticipated that the patient will be medically stable for discharge from the hospital within 2 midnights of admission.   * I certify that at the point of admission it is my clinical judgment that the patient will require inpatient hospital care spanning beyond 2 midnights from the point of admission due to high intensity of service, high risk for further deterioration and high frequency of surveillance required.*Zada FindersMD [facility]  If 7PM-7AM, please contact night-coverage [email or web address]  10/26/2021, 12:16 AM

## 2021-10-26 ENCOUNTER — Other Ambulatory Visit: Payer: Self-pay

## 2021-10-26 DIAGNOSIS — Z7189 Other specified counseling: Secondary | ICD-10-CM

## 2021-10-26 DIAGNOSIS — Z66 Do not resuscitate: Secondary | ICD-10-CM

## 2021-10-26 DIAGNOSIS — C220 Liver cell carcinoma: Secondary | ICD-10-CM

## 2021-10-26 DIAGNOSIS — K7460 Unspecified cirrhosis of liver: Secondary | ICD-10-CM

## 2021-10-26 DIAGNOSIS — I8510 Secondary esophageal varices without bleeding: Secondary | ICD-10-CM

## 2021-10-26 DIAGNOSIS — R7989 Other specified abnormal findings of blood chemistry: Secondary | ICD-10-CM

## 2021-10-26 DIAGNOSIS — Z515 Encounter for palliative care: Secondary | ICD-10-CM

## 2021-10-26 DIAGNOSIS — R180 Malignant ascites: Secondary | ICD-10-CM

## 2021-10-26 LAB — COMPREHENSIVE METABOLIC PANEL
ALT: 169 U/L — ABNORMAL HIGH (ref 0–44)
AST: 363 U/L — ABNORMAL HIGH (ref 15–41)
Albumin: <1.5 g/dL — ABNORMAL LOW (ref 3.5–5.0)
Alkaline Phosphatase: 223 U/L — ABNORMAL HIGH (ref 38–126)
Anion gap: 5 (ref 5–15)
BUN: 24 mg/dL — ABNORMAL HIGH (ref 6–20)
CO2: 19 mmol/L — ABNORMAL LOW (ref 22–32)
Calcium: 7.6 mg/dL — ABNORMAL LOW (ref 8.9–10.3)
Chloride: 98 mmol/L (ref 98–111)
Creatinine, Ser: 0.81 mg/dL (ref 0.61–1.24)
GFR, Estimated: >60 mL/min (ref >60)
Glucose, Bld: 95 mg/dL (ref 70–99)
Potassium: 5.6 mmol/L — ABNORMAL HIGH (ref 3.5–5.1)
Sodium: 122 mmol/L — ABNORMAL LOW (ref 135–145)
Total Bilirubin: 8.2 mg/dL — ABNORMAL HIGH (ref 0.3–1.2)
Total Protein: 7.2 g/dL (ref 6.5–8.1)

## 2021-10-26 LAB — CBC
HCT: 30.1 % — ABNORMAL LOW (ref 39.0–52.0)
Hemoglobin: 10.6 g/dL — ABNORMAL LOW (ref 13.0–17.0)
MCH: 34.8 pg — ABNORMAL HIGH (ref 26.0–34.0)
MCHC: 35.2 g/dL (ref 30.0–36.0)
MCV: 98.7 fL (ref 80.0–100.0)
Platelets: 119 10*3/uL — ABNORMAL LOW (ref 150–400)
RBC: 3.05 MIL/uL — ABNORMAL LOW (ref 4.22–5.81)
RDW: 16.7 % — ABNORMAL HIGH (ref 11.5–15.5)
WBC: 15.9 10*3/uL — ABNORMAL HIGH (ref 4.0–10.5)
nRBC: 0.3 % — ABNORMAL HIGH (ref 0.0–0.2)

## 2021-10-26 LAB — PROTIME-INR
INR: 2.1 — ABNORMAL HIGH (ref 0.8–1.2)
Prothrombin Time: 23.3 seconds — ABNORMAL HIGH (ref 11.4–15.2)

## 2021-10-26 LAB — GLUCOSE, CAPILLARY
Glucose-Capillary: 140 mg/dL — ABNORMAL HIGH (ref 70–99)
Glucose-Capillary: 124 mg/dL — ABNORMAL HIGH (ref 70–99)
Glucose-Capillary: 60 mg/dL — ABNORMAL LOW (ref 70–99)
Glucose-Capillary: 66 mg/dL — ABNORMAL LOW (ref 70–99)
Glucose-Capillary: 49 mg/dL — ABNORMAL LOW (ref 70–99)
Glucose-Capillary: 118 mg/dL — ABNORMAL HIGH (ref 70–99)
Glucose-Capillary: 152 mg/dL — ABNORMAL HIGH (ref 70–99)

## 2021-10-26 MED ORDER — ENSURE ENLIVE PO LIQD
237.0000 mL | Freq: Two times a day (BID) | ORAL | Status: DC
  Administered 2021-10-26 – 2021-10-31 (×4): 237 mL via ORAL

## 2021-10-26 MED ORDER — SODIUM ZIRCONIUM CYCLOSILICATE 10 G PO PACK: 10.0000 g | PACK | Freq: Every day | ORAL | Status: DC

## 2021-10-26 MED ORDER — SODIUM ZIRCONIUM CYCLOSILICATE 10 G PO PACK
10.0000 g | PACK | Freq: Once | ORAL | Status: AC
  Administered 2021-10-26: 10 g via ORAL
  Filled 2021-10-26: qty 1

## 2021-10-26 MED ORDER — FUROSEMIDE 40 MG PO TABS
40.0000 mg | ORAL_TABLET | Freq: Every day | ORAL | Status: DC
  Administered 2021-10-26 – 2021-11-01 (×7): 40 mg via ORAL
  Filled 2021-10-26 (×7): qty 1

## 2021-10-26 MED ORDER — DEXTROSE 50 % IV SOLN
12.5000 g | INTRAVENOUS | Status: AC
  Administered 2021-10-26: 12.5 g via INTRAVENOUS
  Filled 2021-10-26: qty 50

## 2021-10-26 NOTE — Plan of Care (Signed)
?  Problem: Clinical Measurements: ?Goal: Cardiovascular complication will be avoided ?Outcome: Progressing ?  ?Problem: Activity: ?Goal: Risk for activity intolerance will decrease ?Outcome: Progressing ?  ?Problem: Coping: ?Goal: Level of anxiety will decrease ?Outcome: Progressing ?  ?

## 2021-10-26 NOTE — Progress Notes (Addendum)
Dr. Dwyane Dee made aware that pt is scheduled for abdominal drain catheter 10/31/21. Per scheduling, IR is unable to accommodate sooner. Abdominal Pleurex drain being placed for comfort care and then pt to be d/c to [facility]  ? ? ?Narda Rutherford, AGNP-BC ?10/26/2021, 4:15 PM ?  ?

## 2021-10-26 NOTE — Progress Notes (Signed)
[facility]Hospital Liaison Note ? ?Referral received for patient/family interest in [facility].  ? ?Unfortunately, patient is COVID positive. He will need to complete his 10 day isolation period before we are able to evaluate. His isolation period ends tomorrow 3/3. ? ?[place] liaison will evaluate tomorrow.  ? ?Please call with any questions or concerns. Thank you ? ?Roselee Nova, LCSW ?[facility] Liaison ?[phone] ?

## 2021-10-26 NOTE — Assessment & Plan Note (Addendum)
Seen by palliative care on 2/27.  DNR status confirmed and he was referred to hospice care.   ?Patient being discharged to [facility] hospice facility today. ?

## 2021-10-26 NOTE — Assessment & Plan Note (Addendum)
Sodium 124 in setting of hepatic cirrhosis and ascites. ?

## 2021-10-26 NOTE — Assessment & Plan Note (Addendum)
He presented with increasing abdominal pain, recurrent abdominal distention after paracentesis 2/28 (4 L removed), leukocytosis.  Diagnostic paracentesis performed in ED, cell count shows 395 WBCs with 75% neutrophils.  Lipase also elevated at 195. ?Continued IV ceftriaxone 2 g daily ?Continue analgesics as needed. ?Patient underwent Pleurx catheter for comfort on 10/30/21 ?Patient is being discharged to [facility] for hospice. ?

## 2021-10-26 NOTE — Progress Notes (Signed)
Patients brother would like provider to call with update and plan of care.  ?Leda Min [phone] ?

## 2021-10-26 NOTE — Assessment & Plan Note (Addendum)
S/p banding on 09/29/2021 by GI, Dr. Rush Landmark. ?GI consulted.  Since patient was considered hospice candidate.  GI will consult if there is any intervention needed. ?

## 2021-10-26 NOTE — Progress Notes (Signed)
Hypoglycemic Event ? ?CBG: 60 ? ?Treatment: D50 25 mL (12.5 gm) ? ?Symptoms: None ? ?Follow-up CBG: Time:0113 CBG Result:140 ? ?Possible Reasons for Event: Unknown ? ?Comments/MD notified:Daniels NP ? ? ? ?Lonia Skinner ? ? ?

## 2021-10-26 NOTE — Assessment & Plan Note (Addendum)
Potassium 6.0 on admission without EKG changes. ?Lokelma given, insulin D50 given ?Continue oral Lasix, hold spironolactone ?

## 2021-10-26 NOTE — Assessment & Plan Note (Addendum)
Hepatocellular carcinoma ?Approaching End-stage liver disease.  No longer candidate for anticancer treatment due to worsening liver failure per oncology.  LFTs remain persistently elevated.  MELD score is 29 which correlates to a 19.6% 60-month mortality. ?

## 2021-10-26 NOTE — Hospital Course (Addendum)
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 ?Patient recently admitted from 10/16/2021-10/21/2021 for hepatic encephalopathy in setting of known cirrhosis with ascites.  He underwent paracentesis on 2/22 with 2 L off and 2/24 with removal of 4.8 L.  Cultures were negative.  He was treated with lactulose with resolution of his encephalopathy. ?Patient has seen Dr. Burr Medico, in follow-up on 2/27.  Tecentriq was discontinued as he was not considered a candidate for any cancer treatment due to worsening liver failure.  He was seen by palliative care same day who reaffirmed his goals of care including CODE STATUS of DNR and referral to hospice.  He was scheduled for repeat paracentesis on 2/28 and planned for Pleurx catheter placement on 3/7. Palliative care consulted. Plan is for Pleurx catheter for comfort on 10/31/2021 and then discharge to [facility] as per bed availability. Patient underwent successful catheter for comfort on 10/30/2021, tolerated well.  Patient is being discharged to [facility] today. ?

## 2021-10-26 NOTE — TOC Progression Note (Addendum)
Transition of Care (TOC) - Progression Note  ? ? ?Patient Details  ?Name: Patrick Tran ?MRN: [id number] ?Date of Birth: 1971/07/29 ? ?Transition of Care (TOC) CM/SW Contact  ?Purcell Mouton, RN ?Phone Number: ?10/26/2021, 3:42 PM ? ?Clinical Narrative:    ? ? ?Transition of Care (TOC) Screening Note ? ? ?Patient Details  ?Name: Patrick Tran ?Date of Birth: 08/07/1971 ? ? ?Transition of Care (TOC) CM/SW Contact:    ?Purcell Mouton, RN ?Phone Number: ?10/26/2021, 3:42 PM ? ? ?In house rep. Received referral for [facility].   ? ?Transition of Care Department [facility]) has reviewed patient and no TOC needs have been identified at this time. We will continue to monitor patient advancement through interdisciplinary progression rounds. If new patient transition needs arise, please place a TOC consult. ?  ? ?  ?  ? ?Expected Discharge Plan and Services ?  ?  ?  ?  ?  ?                ?  ?  ?  ?  ?  ?  ?  ?  ?  ?  ? ? ?Social Determinants of Health (SDOH) Interventions ?  ? ?Readmission Risk Interventions ?No flowsheet data found. ? ?

## 2021-10-26 NOTE — Consult Note (Signed)
Palliative Care Consult Note                                  Date: 10/26/2021   Patient Name: Patrick Tran  DOB: [birth year]  MRN: [id number]  Age / Sex: 51 y.o., male  PCP: Patient, No Pcp Per (Inactive) Referring Physician: Shawna Clamp, MD  Reason for Consultation: Establishing goals of care  HPI/Patient Profile: Palliative Care consult requested for goals of care discussion in this 51 y.o. male  with past medical history of untreated hepatitis C, liver cirrhosis, recurrent malignant ascites, with recent diagnosis (09/08/21) of hepatocellular carcinoma (multiple bilateral liver masses). Recently was on immunotherapy (Tecentriq) and Avastin however has been discontinued due to poor tolerance and progression. Recent paracentesis yielding 4 L on 2/28. Scheduled PleurX cath to be placed on 3/7. I saw patient at [facility] on 2/27 and after long discussions patient requested to enroll in hospice services. Per [facility] he was scheduled to be admitted under their services on 3/6.  He was admitted via EMS from home on 10/25/2021 with abdominal pain.    Past Medical History:  Diagnosis Date   CAP (community acquired pneumonia)    Cirrhosis ([place])    Hepatitis C virus infection    Hepatocellular carcinoma ([place])      Subjective:   This NP Osborne Oman reviewed medical records, received report from team, assessed the patient and then met at the patient's bedside with Jarrett Soho, RN. Virtual interpretor, Philippa Chester (Kinyarwanda language) to discuss diagnosis, prognosis, GOC, EOL wishes disposition and options. We attempted to contact patient's brother-in-law Marya Amsler however no answer. Patient also requested to contact close friend, Marney Setting. Marney Setting was available speaks fluent [place]. He was involved in discussions via speakerphone.    Mr. Thomas Hoff expressed appreciation for seeing a "known face" in his hospital care. He is able to verbalize  understanding or Palliative's role in his care.   Re-education provided on Palliative's goal is to improve quality of life for both the patient and the family. Values and goals of care important to patient and family were attempted to be elicited.  Luisangel shares he was feeling a little better after recent paracentesis however pain escalated again within 24 hours and was uncontrolled. He did not pick up the Roxanol that I had sent in to the pharmacy for him. He was trying to manage as much as possible however shares he did not want to die suffering in his home.   Continues to have poor po intake. Has not eaten in days due to pain and decreased appetite. He does endorse he is thirsty and would like something to drink.   We discussed His current illness and what it means in the larger context of His on-going co-morbidities. Natural disease trajectory and expectations were discussed. Patient is emotional expressing "things are just happening fast" and he wants to not hurt or suffer. Emotional support provided.   I provided extensive updates regarding expectations at end-of-life, symptom management, and hospice options. Aloys verbalized understanding expressing his appreciation for being a part of the discussions as he was not aware of the extent which is why friends called 70. He ask appropriate questions regarding patient's ability to undergo transplant and any other available treatment options. He and patient expressed understanding of poor prognosis with no viable options including surgery, transplant, or further immunotherapies.   Mr. Beckers is clear in his expressed wishes. He would like  to treat the treatable while hospitalized. He does not wish for escalation in care. His main focus is on his comfort and pain control for what time he has left.   He does not wish to further talk about "how much time" he has left as this is not culturally respected. Shares beliefs that when he passes away then  that will be in God's timing with acknowledgment of poor prognosis. Emotional support provided.   I discussed the importance of continued conversation with family and their medical providers regarding overall plan of care and treatment options, ensuring decisions are within the context of the patients values and GOCs.  Questions and concerns were addressed.  Patient and family was encouraged to call with questions or concerns.  PMT will continue to support holistically as needed.  Objective:   Primary Diagnoses: Present on Admission:  SBP (spontaneous bacterial peritonitis) ([place])  Hepatic cirrhosis due to chronic hepatitis C infection ([place])  Hepatocellular carcinoma (HCC)  Hyponatremia  Hyperkalemia  DNR (do not resuscitate)/DNI(Do Not Intubate)   Scheduled Meds:  insulin aspart  5 Units Intravenous Once   And   dextrose  1 ampule Intravenous Once   feeding supplement  237 mL Oral BID BM   furosemide  40 mg Oral Daily   lactulose  20 g Oral TID   sodium chloride flush  3 mL Intravenous Q12H    Continuous Infusions:  cefTRIAXone (ROCEPHIN)  IV Stopped (10/25/21 2339)    PRN Meds: morphine injection, ondansetron **OR** ondansetron (ZOFRAN) IV  No Known Allergies  Review of Systems  Constitutional:  Positive for appetite change.  Gastrointestinal:  Positive for abdominal distention, abdominal pain and blood in stool.  Neurological:  Positive for weakness.  Unless otherwise noted, a complete review of systems is negative.  Physical Exam General: NAD, frail cachectic,-ill appearing Cardiovascular: regular rate and rhythm Pulmonary:  diminished bilaterally  Abdomen: firm, distention, tender, + bowel sounds Extremities: bilateral lower extremity edema Skin: no rashes, warm and dry, thin, muscle and temporal wasting  Neurological: AAO x4 with [place] interpretor   Vital Signs:  BP 128/74 (BP Location: Right Arm)    Pulse 97    Temp 97.7 F (36.5 C) (Oral)    Resp 20    Ht  _0  (1.651 m)    Wt 58 kg    SpO2 99%    BMI 21.28 kg/m  Pain Scale: 0-10   Pain Score: 0-No pain  SpO2: SpO2: 99 % O2 Device:SpO2: 99 % O2 Flow Rate: .O2 Flow Rate (L/min): 1.74 L/min  IO: Intake/output summary:  Intake/Output Summary (Last 24 hours) at 10/26/2021 2204 Last data filed at 10/26/2021 1600 Gross per 24 hour  Intake 483.24 ml  Output 351 ml  Net 132.24 ml    LBM: Last BM Date : 10/25/21 Baseline Weight: Weight: 64.2 kg Most recent weight: Weight: 58 kg      Palliative Assessment/Data: PPS 20%   Advanced Care Planning:   Primary Decision Maker: PATIENT and friends   Code Status/Advance Care Planning: DNR  Patient confirms DNR/DNI. Attempted discussions regarding MOST form however does not wish to complete at this time.   He does not have a documented advanced directive. Shares his primary contact or decision maker would be his friends [name] and [employer].   Hospice were explained at length. Education provided on in home hospice versus residential. Patient expresses he would like hospice home referral as he does not wish to pass away at home and  fear of uncontrolled symptoms. Education provided on referral and approval process. Patient and family verbalized their understanding and awareness of hospice's goals and philosophy of care and referral process.     Assessment & Plan:   SUMMARY OF RECOMMENDATIONS   DNR/DNI-as confirmed by patient Continue with current plan of care to treat the treatable. No escalation of care. Main goal is for comfort.  Extensive discussion with patient and friend Marney Setting as requested. They verbalized understanding of poor prognosis and expectations. Patient is requesting referral to [facility] (choice to be in [place]). Education provided on goals, philosophy of care, and referral process.  Patient does not have an advanced directive. He is clear in expressed wishes for his close friends [name] and Francetta Found to be his primary decision makers. Both speak [place].  PMT will continue to support and follow as needed. Please call team line with urgent needs.  Symptom Management:  Neoplasm related pain  Morphine IV as needed  Roxanol as needed    Palliative Prophylaxis:  Bowel Regimen and Frequent Pain Assessment  Additional Recommendations (Limitations, Scope, Preferences): No escalation of care  , DNR  Psycho-social/Spiritual:  Desire for further Chaplaincy support: no Additional Recommendations: Education on Hospice  Prognosis:  [place] the setting of hepatocellular carcinoma, hepatic cirrhosis, recurrent malignant ascites (4L on 2/28, 4.8 l on 2/24, 2L on 2/22), SBP, hyponatremia, hyperkalemia, severe malnutrition, cachectic, temporal wasting, deconditioning, discontinuation of chemotherapy, Almumin <1.5, AST 363, ALT 203.    Discharge Planning:  Hospice facility   Discussed with: Dr. Dwyane Dee.   Patient and friend expressed understanding and was in agreement with this plan.   Time Total: 50 min.   Visit consisted of counseling and education dealing with the complex and emotionally intense issues of symptom management and palliative care in the setting of serious and potentially life-threatening illness.Greater than 50%  of this time was spent counseling and coordinating care related to the above assessment and plan.  Signed by:  Alda Lea, AGPCNP-BC Palliative Medicine Team  Phone: [phone] Pager: [phone] Amion: Bjorn Pippin   Thank you for allowing the Palliative Medicine Team to assist in the care of this patient. Please utilize secure chat with additional questions, if there is no response within 30 minutes please call the above phone number. Palliative Medicine Team providers are available by phone from 7am to 5pm daily and can be reached through the team cell phone.  Should this patient require assistance outside of these hours, please call the  patient's attending physician.

## 2021-10-26 NOTE — Plan of Care (Signed)
  Problem: Education: Goal: Knowledge of General Education information will improve Description: Including pain rating scale, medication(s)/side effects and non-pharmacologic comfort measures Outcome: Progressing   Problem: Safety: Goal: Ability to remain free from injury will improve Outcome: Progressing   

## 2021-10-26 NOTE — Progress Notes (Addendum)
?Progress Note ? ? ?Patient: Patrick Tran LFY:[id number] DOB: September 23, 1970 DOA: 10/25/2021     1 ? ?DOS: the patient was seen and examined on 10/26/2021 ?  ?Brief hospital course: ?This 51 yrs old [place] speaking male with PMH significant for hepatic cirrhosis secondary to hepatitis C, hepatocellular carcinoma, portal hypertension with esophageal varices s/p banding 09/29/2021, ascites, history of hepatic encephalopathy who is admitted with spontaneous bacterial peritonitis. ?Patient recently admitted from 10/16/2021-10/21/2021 for hepatic encephalopathy in setting of known cirrhosis with ascites.  He underwent paracentesis on 2/22 with 2 L off and 2/24 with removal of 4.8 L.  Cultures were negative.  He was treated with lactulose with resolution of his encephalopathy. ?Patient has seen Dr. Burr Medico, in follow-up on 2/27.  Tecentriq was discontinued as he was not considered a candidate for any cancer treatment due to worsening liver failure.  He was seen by palliative care same day who reaffirmed his goals of care including CODE STATUS of DNR and referral to hospice.  He was scheduled for repeat paracentesis on 2/28 and planned for Pleurx catheter placement on 3/7. ?Palliative care consulted.  Patient would prefer [facility], IR consulted for pleurex for comfort. ? ?Assessment and Plan: ?* SBP (spontaneous bacterial peritonitis) ([place]) ?He presented with increasing abdominal pain, recurrent abdominal distention after paracentesis 2/28 (4 L removed), leukocytosis.  Diagnostic paracentesis performed in ED, cell count shows 395 WBCs with 75% neutrophils.  Lipase also elevated at 195. ?Continue IV ceftriaxone 2 g daily ?Follow peritoneal fluid and blood cultures ?Continue analgesics as needed ? ?Esophageal varices in cirrhosis (HCC) ?S/p banding on 09/29/2021 by GI, Dr. Rush Landmark. ?GI consulted.  Since patient was considered hospice candidate.  GI will consult if there is any intervention needed. ? ?Hyponatremia ?Sodium  122 in setting of hepatic cirrhosis and ascites. ? ?DNR (do not resuscitate)/DNI(Do Not Intubate) ?Seen by palliative care on 2/27.  DNR status confirmed and he was referred to hospice care.  May benefit from palliative care consult in hospital and transition towards hospice prior to discharge. ? ?Hyperkalemia ?Potassium 6.0 on admission without EKG changes. ?Lokelma given, insulin D50 given ?Continue oral Lasix, hold spironolactone ? ?Hepatic cirrhosis due to chronic hepatitis C infection ([place]) ?Hepatocellular carcinoma ?Approaching End-stage liver disease.  No longer candidate for anticancer treatment due to worsening liver failure per oncology.  LFTs remain persistently elevated.  MELD score is 29 which correlates to a 19.6% 25-month mortality. ? ? ?Subjective: Patient was seen and examined at bedside.  Overnight events noted. ?Patient seems very frail, chronically ill looking, lying comfortably. ? ?Physical Exam: ?Vitals:  ? 10/26/21 0100 10/26/21 0430 10/26/21 0830 10/26/21 1408  ?BP:  117/66 (!) 109/58 (!) 104/58  ?Pulse:  88 92 95  ?Resp:  19 18 16   ?Temp:  98.1 ?F (36.7 ?C) 98.6 ?F (37 ?C) 97.8 ?F (36.6 ?C)  ?TempSrc:  Oral Oral Oral  ?SpO2:  97% 100% 100%  ?Weight: 58 kg     ?Height: 5\' 5"  (1.651 m)     ? ?General exam: Appears comfortable, chronically ill looking, deconditioned, frail, not in any distress. ?Respiratory : Decreased breath sounds, normal respiratory effort. ?Cardiovascular : S1-S2 heard, regular rate and rhythm, no murmur. ?Gastrointestinal : Abdomen is soft, mildly distended, mildly tender, BS+. ?Central nervous system: Alert and oriented x 2, no focal neurological deficits. ?Extremities: No edema, no cyanosis, no clubbing. ?Psychiatry: Mood, normal, insight and judgment appropriate ? ? ?Data Reviewed: ?I have Reviewed nursing notes, Vitals, and Lab results since pt's  last encounter. Pertinent lab results CBC, CMP ?I have ordered test including CBC, CMP ?I have reviewed the last note from  palliative care,  ?I have discussed pt's care plan and test results with patient.  ? ?Family Communication: No family at bedside ? ?Disposition: ?Status is: Inpatient ?Remains inpatient appropriate because: Admitted for spontaneous bacterial peritonitis requiring IV antibiotics.  Palliative care was consulted.  Patient will eventually need Pleurx catheter for comfort in discharged to [facility]. ? ? Planned Discharge Destination:  [facility]osp[facility] ? ?Time spent: 50 minutes ? ?Author: ?Shawna Clamp, MD ?10/26/2021 2:32 PM ? ?For on call review [email or web address].  ? ?

## 2021-10-27 LAB — COMPREHENSIVE METABOLIC PANEL
ALT: 175 U/L — ABNORMAL HIGH (ref 0–44)
AST: 379 U/L — ABNORMAL HIGH (ref 15–41)
Albumin: <1.5 g/dL — ABNORMAL LOW (ref 3.5–5.0)
Alkaline Phosphatase: 213 U/L — ABNORMAL HIGH (ref 38–126)
Anion gap: 5 (ref 5–15)
BUN: 30 mg/dL — ABNORMAL HIGH (ref 6–20)
CO2: 20 mmol/L — ABNORMAL LOW (ref 22–32)
Calcium: 7.5 mg/dL — ABNORMAL LOW (ref 8.9–10.3)
Chloride: 99 mmol/L (ref 98–111)
Creatinine, Ser: 0.74 mg/dL (ref 0.61–1.24)
GFR, Estimated: >60 mL/min (ref >60)
Glucose, Bld: 118 mg/dL — ABNORMAL HIGH (ref 70–99)
Potassium: 4.6 mmol/L (ref 3.5–5.1)
Sodium: 124 mmol/L — ABNORMAL LOW (ref 135–145)
Total Bilirubin: 7.5 mg/dL — ABNORMAL HIGH (ref 0.3–1.2)
Total Protein: 7.4 g/dL (ref 6.5–8.1)

## 2021-10-27 LAB — GLUCOSE, BODY FLUID OTHER: Glucose, Body Fluid Other: 99 mg/dL

## 2021-10-27 NOTE — Progress Notes (Signed)
Nutrition Brief Note ? ?Patient identified on the Malnutrition Screening Tool (MST) Report. ? ?Wt Readings from Last 15 Encounters:  ?10/27/21 58.2 kg  ?10/21/21 64.2 kg  ?10/13/21 64.6 kg  ?10/02/21 58.3 kg  ?09/29/21 56 kg  ?09/27/21 56 kg  ?09/18/21 56.3 kg  ?09/10/21 59.6 kg  ?05/28/13 62.6 kg  ? ? ?Body mass index is 21.36 kg/m?Marland Kitchen Patient meets criteria for normal weight based on current BMI. Weight stable compared to weight 09/10/21-10/02/21; weight up on recordings 2/17 ad 2/25. Mild pitting edema to BLE documented in the edema section of flow sheet. Skin WDL. ? ?Patient noted to need [place]; [place] interpreter. ? ?Current diet order is Dysphagia 3, thin liquids. Ensure Plus High Protein ordered BID on admission, each supplement provides 350 kcal and 20 grams of protein. He has accepted the bottle offered to him last night and this afternoon.  ? ?He ate 100% of lunch yesterday (783 kcal and 32 grams protein).  ? ?Labs and medications reviewed.  ? ?Notes reviewed. Palliative following. Patient is DNR/DNI with PPS of 20%. Notes indicate plan is for Pleurx placement for comfort and disposition at the time of d/c is residential hospice (with patient preference for [facility]). ? ?No nutrition interventions warranted at this time. If nutrition issues arise, please consult RD.  ? ? ? ?Jarome Matin, MS, RD, LDN ?Inpatient Clinical Dietitian ?RD pager # available in [place]  ?After hours/weekend pager # available in [place] ? ? ?

## 2021-10-27 NOTE — Progress Notes (Addendum)
?Progress Note ? ? ?Patient: Patrick Tran BZJ:[id number] DOB: 26-Nov-1970 DOA: 10/25/2021     2 ? ?DOS: the patient was seen and examined on 10/27/2021 ?  ?Brief hospital course: ?This 51 yrs old [place] speaking male with PMH significant for hepatic cirrhosis secondary to hepatitis C, hepatocellular carcinoma, portal hypertension with esophageal varices s/p banding 09/29/2021, ascites, history of hepatic encephalopathy who is admitted with spontaneous bacterial peritonitis. ?Patient recently admitted from 10/16/2021-10/21/2021 for hepatic encephalopathy in setting of known cirrhosis with ascites.  He underwent paracentesis on 2/22 with 2 L off and 2/24 with removal of 4.8 L.  Cultures were negative.  He was treated with lactulose with resolution of his encephalopathy. ?Patient has seen Dr. Burr Medico, in follow-up on 2/27.  Tecentriq was discontinued as he was not considered a candidate for any cancer treatment due to worsening liver failure.  He was seen by palliative care same day who reaffirmed his goals of care including CODE STATUS of DNR and referral to hospice.  He was scheduled for repeat paracentesis on 2/28 and planned for Pleurx catheter placement on 3/7. ?Palliative care consulted.  Patient would prefer [facility], IR consulted for pleurex for comfort. ? ?Assessment and Plan: ?* SBP (spontaneous bacterial peritonitis) ([place]) ?He presented with increasing abdominal pain, recurrent abdominal distention after paracentesis 2/28 (4 L removed), leukocytosis.  Diagnostic paracentesis performed in ED, cell count shows 395 WBCs with 75% neutrophils.  Lipase also elevated at 195. ?Continue IV ceftriaxone 2 g daily ?Follow peritoneal fluid and blood cultures ?Continue analgesics as needed. ?Patient need Pleurx catheter for comfort tentatively scheduled for 10/31/2021. ? ?Esophageal varices in cirrhosis (HCC) ?S/p banding on 09/29/2021 by GI, Dr. Rush Landmark. ?GI consulted.  Since patient was considered hospice candidate.   GI will consult if there is any intervention needed. ? ?Hyponatremia ?Sodium 124 in setting of hepatic cirrhosis and ascites. ? ?DNR (do not resuscitate)/DNI(Do Not Intubate) ?Seen by palliative care on 2/27.  DNR status confirmed and he was referred to hospice care.  May benefit from palliative care consult in hospital and transition towards hospice prior to discharge. ? ?Hepatic cirrhosis due to chronic hepatitis C infection ([place]) ?Hepatocellular carcinoma ?Approaching End-stage liver disease.  No longer candidate for anticancer treatment due to worsening liver failure per oncology.  LFTs remain persistently elevated.  MELD score is 29 which correlates to a 19.6% 29-month mortality. ? ?Hyperkalemia-resolved as of 10/27/2021 ?Potassium 6.0 on admission without EKG changes. ?Lokelma given, insulin D50 given ?Continue oral Lasix, hold spironolactone ? ? ?Subjective: Patient was seen and examined at bedside.  Overnight events noted. ?Patient seems very frail, thin built, chronically ill looking with distended abdomen. ?He is lying comfortably on the bed, reports mild abdominal pain. ? ?Physical Exam: ?Vitals:  ? 10/26/21 2110 10/27/21 0445 10/27/21 0500 10/27/21 1414  ?BP: 128/74 129/76  132/79  ?Pulse: 97 (!) 102  (!) 102  ?Resp: 20 18    ?Temp: 97.7 ?F (36.5 ?C) 98.3 ?F (36.8 ?C)  98 ?F (36.7 ?C)  ?TempSrc: Oral Oral  Oral  ?SpO2: 99% 100%  100%  ?Weight:   58.2 kg   ?Height:      ? ?General exam: Appears chronically ill looking, thin built, frail, not in any acute distress. ?Respiratory : Decreased breath sounds, normal respiratory effort. ?Cardiovascular : S1-S2 heard, regular rate and rhythm, no murmur. ?Gastrointestinal : Abdomen is soft, mildly distended, mildly tender, BS+. ?Central nervous system: Alert and oriented x 2, no focal neurological deficits. ?Extremities: No edema, no  cyanosis, no clubbing. ?Psychiatry: Mood, normal, insight and judgment appropriate ? ? ?Data Reviewed: ?I have Reviewed nursing notes,  Vitals, and Lab results since pt's last encounter. Pertinent lab results CBC CMP ?I have ordered test including none ?I have reviewed the last note from palliative care, IR,  ?I have discussed pt's care plan and test results with patient.  ? ?Family Communication: No family at bedside ? ?Disposition: ?Status is: Inpatient ?Remains inpatient appropriate because: Admitted for spontaneous bacterial peritonitis requiring IV antibiotics.  Palliative care was consulted.  Patient will eventually need Pleurx catheter for comfort in discharged to [facility]. ? ? Planned Discharge Destination:  Hosp[facility] ? ?Time spent: 35 minutes ? ?Author: ?Shawna Clamp, MD ?10/27/2021 2:31 PM ? ?For on call review [email or web address].  ? ?

## 2021-10-28 NOTE — Progress Notes (Signed)
Manufacturing engineer [facility]) Hospital Liaison Note ? ?Patient has been approved for [facility].  ? ?Plan is to have pleurex drain placed on 3.7. depending on patient's condition and bed availability at [facility] we can transfer on or after this date.  ? ?[place] liaison will continue to follow. Please call with any questions or concerns. Thank you ? ?Roselee Nova, LCSW ?[place] [facility] Liaison  ?[phone] ?

## 2021-10-28 NOTE — Progress Notes (Signed)
?Progress Note ? ? ?Patient: Patrick Tran NKN:[id number] DOB: 20-Apr-1971 DOA: 10/25/2021     3 ? ?DOS: the patient was seen and examined on 10/28/2021 ?  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 ?Patient recently admitted from 10/16/2021-10/21/2021 for hepatic encephalopathy in setting of known cirrhosis with ascites.  He underwent paracentesis on 2/22 with 2 L off and 2/24 with removal of 4.8 L.  Cultures were negative.  He was treated with lactulose with resolution of his encephalopathy. ?Patient has seen Dr. Burr Medico, in follow-up on 2/27.  Tecentriq was discontinued as he was not considered a candidate for any cancer treatment due to worsening liver failure.  He was seen by palliative care same day who reaffirmed his goals of care including CODE STATUS of DNR and referral to hospice.  He was scheduled for repeat paracentesis on 2/28 and planned for Pleurx catheter placement on 3/7. ?Palliative care consulted.  Plan for Pleurx catheter for comfort on 10/31/2021 and then discharge [facility] as per bed availability. ? ?Assessment and Plan: ?* SBP (spontaneous bacterial peritonitis) ([place]) ?He presented with increasing abdominal pain, recurrent abdominal distention after paracentesis 2/28 (4 L removed), leukocytosis.  Diagnostic paracentesis performed in ED, cell count shows 395 WBCs with 75% neutrophils.  Lipase also elevated at 195. ?Continue IV ceftriaxone 2 g daily ?Continue analgesics as needed. ?Patient need Pleurx catheter for comfort tentatively scheduled for 10/31/2021. ?Patient will be discharged to [facility] afterwards. ? ?Esophageal varices in cirrhosis (HCC) ?S/p banding on 09/29/2021 by GI, Dr. Rush Landmark. ?GI consulted.   Since patient was considered hospice candidate.  GI will consult if there is any intervention needed. ? ?Hyponatremia ?Sodium 124 in setting of hepatic cirrhosis and ascites. ? ?DNR (do not resuscitate)/DNI(Do Not Intubate) ?Seen by palliative care on 2/27.  DNR status confirmed and he was referred to hospice care.  May benefit from palliative care consult in hospital and transition towards hospice prior to discharge. ? ?Hepatic cirrhosis due to chronic hepatitis C infection ([place]) ?Hepatocellular carcinoma ?Approaching End-stage liver disease.  No longer candidate for anticancer treatment due to worsening liver failure per oncology.  LFTs remain persistently elevated.  MELD score is 29 which correlates to a 19.6% 36-monthmortality. ? ?Hyperkalemia-resolved as of 10/27/2021 ?Potassium 6.0 on admission without EKG changes. ?Lokelma given, insulin D50 given ?Continue oral Lasix, hold spironolactone ? ? ?Subjective: Patient was seen and examined at bedside.  Overnight events noted. ?Patient appears very frail, thin built, chronically ill looking with distended abdomen. ?He is lying comfortably on the bed. ? ?Physical Exam: ?Vitals:  ? 10/27/21 2003 10/28/21 0438 10/28/21 0500 10/28/21 1301  ?BP: 121/86 126/78  (!) 146/97  ?Pulse: 91 86  97  ?Resp: (!) 22 (!) 22  18  ?Temp: 97.7 ?F (36.5 ?C) 97.6 ?F (36.4 ?C)  98.4 ?F (36.9 ?C)  ?TempSrc: Oral Oral    ?SpO2: 100% 98%  100%  ?Weight:   57.6 kg   ?Height:      ? ?General exam: Appears chronically ill looking, thin built, frail, not in any distress. ?Respiratory : Decreased breath sounds, normal respiratory effort. RFX90?Cardiovascular : S1-S2 heard, regular rate and rhythm, no murmur. ?Gastrointestinal : Abdomen is soft, distended, mildly tender, BS+. ?Central nervous system: Alert and oriented x 2, no focal neurological  deficits. ?Extremities: No edema, no cyanosis, no clubbing. ?Psychiatry: Mood, normal, insight and judgment appropriate ? ? ?Data Reviewed: ?I have Reviewed  nursing notes, Vitals, and Lab results since pt's last encounter. Pertinent lab results CBC, CMP ?I have ordered test including CBC, CMP ?I have reviewed the last note from IR, gastroenterology, palliative care,  ?I have discussed pt's care plan and test results with patient.  ? ?Family Communication: No family at bedside ? ?Disposition: ?Status is: Inpatient ?Remains inpatient appropriate because: Admitted for spontaneous bacterial peritonitis requiring IV antibiotics.  Palliative care was consulted.  Patient will eventually need Pleurx catheter for comfort in discharged [facility]. ? ? Planned Discharge Destination:  [facility] ? ?Time spent: 35 minutes ? ?Author: ?Shawna Clamp, MD ?10/28/2021 2:14 PM ? ?For on call review [email or web address]  ? ?

## 2021-10-29 LAB — BODY FLUID CULTURE W GRAM STAIN

## 2021-10-29 MED ORDER — HYDROXYZINE HCL 10 MG PO TABS
10.0000 mg | ORAL_TABLET | Freq: Three times a day (TID) | ORAL | Status: DC | PRN
  Administered 2021-10-30 – 2021-10-31 (×4): 10 mg via ORAL
  Filled 2021-10-29 (×4): qty 1

## 2021-10-29 MED ORDER — MORPHINE SULFATE (PF) 2 MG/ML IV SOLN
2.0000 mg | INTRAVENOUS | Status: DC | PRN
  Administered 2021-10-30 – 2021-11-01 (×5): 2 mg via INTRAVENOUS
  Filled 2021-10-29 (×5): qty 1

## 2021-10-29 MED ORDER — MORPHINE SULFATE (PF) 2 MG/ML IV SOLN
1.0000 mg | Freq: Once | INTRAVENOUS | Status: AC
  Administered 2021-10-29: 1 mg via INTRAVENOUS
  Filled 2021-10-29: qty 1

## 2021-10-29 NOTE — Progress Notes (Signed)
?Progress Note ? ? ?Patient: Patrick Tran SJG:[id number] DOB: 1971-02-10 DOA: 10/25/2021     4 ? ?DOS: the patient was seen and examined on 10/29/2021 ?  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 ?Patient recently admitted from 10/16/2021-10/21/2021 for hepatic encephalopathy in setting of known cirrhosis with ascites.  He underwent paracentesis on 2/22 with 2 L off and 2/24 with removal of 4.8 L.  Cultures were negative.  He was treated with lactulose with resolution of his encephalopathy. ?Patient has seen Dr. Burr Medico, in follow-up on 2/27.  Tecentriq was discontinued as he was not considered a candidate for any cancer treatment due to worsening liver failure.  He was seen by palliative care same day who reaffirmed his goals of care including CODE STATUS of DNR and referral to hospice.  He was scheduled for repeat paracentesis on 2/28 and planned for Pleurx catheter placement on 3/7. ?Palliative care consulted. Plan is for Pleurx catheter for comfort on 10/31/2021 and then discharge [facility] as per bed availability. ? ?Assessment and Plan: ?* SBP (spontaneous bacterial peritonitis) ([place]) ?He presented with increasing abdominal pain, recurrent abdominal distention after paracentesis 2/28 (4 L removed), leukocytosis.  Diagnostic paracentesis performed in ED, cell count shows 395 WBCs with 75% neutrophils.  Lipase also elevated at 195. ?Continue IV ceftriaxone 2 g daily ?Continue analgesics as needed. ?Patient need Pleurx catheter for comfort tentatively scheduled for 10/31/2021. ?Patient will be discharged [facility] afterwards. ? ?Esophageal varices in cirrhosis (HCC) ?S/p banding on 09/29/2021 by GI, Dr. Rush Landmark. ?GI consulted.   Since patient was considered hospice candidate.  GI will consult if there is any intervention needed. ? ?Hyponatremia ?Sodium 124 in setting of hepatic cirrhosis and ascites. ? ?DNR (do not resuscitate)/DNI(Do Not Intubate) ?Seen by palliative care on 2/27.  DNR status confirmed and he was referred to hospice care.  May benefit from palliative care consult in hospital and transition towards hospice prior to discharge. ? ?Hepatic cirrhosis due to chronic hepatitis C infection ([place]) ?Hepatocellular carcinoma ?Approaching End-stage liver disease.  No longer candidate for anticancer treatment due to worsening liver failure per oncology.  LFTs remain persistently elevated.  MELD score is 29 which correlates to a 19.6% 10-monthmortality. ? ?Hyperkalemia-resolved as of 10/27/2021 ?Potassium 6.0 on admission without EKG changes. ?Lokelma given, insulin D50 given ?Continue oral Lasix, hold spironolactone ? ? ?Subjective: Patient was seen and examined at bedside.  Overnight events noted. ?Patient appears very frail, thin built, chronically ill looking with a distended abdomen. ?He is lying comfortably in the bed, overnight he started leaking from paracentesis site. ?Now seems better. ? ?Physical Exam: ?Vitals:  ? 10/28/21 1301 10/28/21 2040 10/29/21 0434 10/29/21 0439  ?BP: (!) 146/97 (!) 142/89  137/88  ?Pulse: 97 88  90  ?Resp: '18 14  14  '$ ?Temp: 98.4 ?F (36.9 ?C) 97.8 ?F (36.6 ?C)  98 ?F (36.7 ?C)  ?TempSrc:  Oral  Oral  ?SpO2: 100% 100%  100%  ?Weight:   59.6 kg   ?Height:      ? ?General exam: Appears chronically ill looking, deconditioned, thin, frail.  Cachectic. ?Respiratory : Decreased breath sounds, normal respiratory effort, RR 18 ?Cardiovascular : S1-S2 heard, regular rate and rhythm, no murmur. ?Gastrointestinal : Abdomen is soft, distended, mildly tender, BS+. ?Central nervous  system: Alert and oriented x 2, no focal neurological deficits. ?Extremities: No edema, no cyanosis, no clubbing. ?Psychiatry: Mood, normal,  insight and judgment appropriate ? ? ?Data Reviewed: ?I have Reviewed nursing notes, Vitals, and Lab results since pt's last encounter. Pertinent lab results CBC, BMP, ascitic fluid ?I have ordered test including none ?I have reviewed the last note from palliative care,  ?I have discussed pt's care plan and test results with patient.  ? ?Family Communication: No family at bedside ? ?Disposition: ?Status is: Inpatient ?Remains inpatient appropriate because: Admitted for spontaneous bacterial peritonitis requiring IV antibiotics.  Palliative care was consulted.  Patient will eventually need Pleurx catheter for comfort in discharged [facility]. ? ? Planned Discharge Destination:  [facility] ? ?Time spent: 35 minutes ? ?Author: ?Shawna Clamp, MD ?10/29/2021 11:14 AM ? ?For on call review [email or web address]  ? ?

## 2021-10-29 NOTE — Progress Notes (Signed)
Pt's paracentesis puncture site on L lower abd was leaky and dressing was very saturated w/ mostly serosanguineous and little bit of sanguineous fluid. Changed dressing. ?

## 2021-10-29 NOTE — Progress Notes (Signed)
Manufacturing engineer [facility]) Hospital Liaison Note ? ?[facility] continues to follow. Patient has been approved for [facility]. Please call with any questions or concerns. Thank you ? ?Roselee Nova, LCSW ?[facility] Liaison ?[phone] ?

## 2021-10-30 ENCOUNTER — Inpatient Hospital Stay (HOSPITAL_COMMUNITY): Payer: BC Managed Care – PPO

## 2021-10-30 ENCOUNTER — Other Ambulatory Visit (HOSPITAL_COMMUNITY): Payer: Self-pay | Admitting: Physician Assistant

## 2021-10-30 MED ORDER — MIDAZOLAM HCL 2 MG/2ML IJ SOLN
INTRAMUSCULAR | Status: AC | PRN
  Administered 2021-10-30 (×2): 1 mg via INTRAVENOUS

## 2021-10-30 MED ORDER — CEFAZOLIN SODIUM-DEXTROSE 2-4 GM/100ML-% IV SOLN: 2.0000 g | Freq: Once | INTRAVENOUS | Status: AC

## 2021-10-30 MED ORDER — FENTANYL CITRATE (PF) 100 MCG/2ML IJ SOLN
INTRAMUSCULAR | Status: AC
  Filled 2021-10-30: qty 2

## 2021-10-30 MED ORDER — FENTANYL CITRATE (PF) 100 MCG/2ML IJ SOLN
INTRAMUSCULAR | Status: AC | PRN
Start: 2021-10-30 — End: 2021-10-30
  Administered 2021-10-30 (×2): 50 ug via INTRAVENOUS

## 2021-10-30 MED ORDER — CEFAZOLIN SODIUM-DEXTROSE 2-4 GM/100ML-% IV SOLN
2.0000 g | Freq: Three times a day (TID) | INTRAVENOUS | Status: DC
  Administered 2021-10-30 – 2021-11-01 (×5): 2 g via INTRAVENOUS
  Filled 2021-10-30 (×6): qty 100

## 2021-10-30 MED ORDER — DIPHENHYDRAMINE HCL 50 MG/ML IJ SOLN
12.5000 mg | Freq: Once | INTRAMUSCULAR | Status: AC
Start: 2021-10-30 — End: 2021-10-30
  Administered 2021-10-30: 12.5 mg via INTRAVENOUS
  Filled 2021-10-30: qty 1

## 2021-10-30 MED ORDER — CEFAZOLIN SODIUM-DEXTROSE 2-4 GM/100ML-% IV SOLN: 2.0000 g | INTRAVENOUS | Status: AC

## 2021-10-30 MED ORDER — MIDAZOLAM HCL 2 MG/2ML IJ SOLN
INTRAMUSCULAR | Status: AC
  Filled 2021-10-30: qty 2

## 2021-10-30 MED ORDER — LIDOCAINE-EPINEPHRINE 1 %-1:100000 IJ SOLN
INTRAMUSCULAR | Status: AC | PRN
  Administered 2021-10-30: 10 mL

## 2021-10-30 MED ORDER — CEFAZOLIN SODIUM-DEXTROSE 2-4 GM/100ML-% IV SOLN
INTRAVENOUS | Status: AC
  Administered 2021-10-30: 2 g via INTRAVENOUS
  Filled 2021-10-30: qty 100

## 2021-10-30 NOTE — Progress Notes (Addendum)
Pt complained severe pain on upper& mid abdomen and morphine was given @ 0232. He started scratching his body after receiving morphine. Atarax was given@ 0251 to relieve itching. Pt  said that his body is still itchy and he's scratching his body. His skin is warm to touch and he doesn't show any respiratory distress. NP on- call notified and new order of benadryl IV injection received and given. Will continue to closely monitor pt. ?

## 2021-10-30 NOTE — Progress Notes (Signed)
?Progress Note ? ? ?Patient: Patrick Tran ZSW:[id number] DOB: [birth year] DOA: 10/25/2021     5 ? ?DOS: the patient was seen and examined on 10/30/2021 ?  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 ?Patient recently admitted from 10/16/2021-10/21/2021 for hepatic encephalopathy in setting of known cirrhosis with ascites.  He underwent paracentesis on 2/22 with 2 L off and 2/24 with removal of 4.8 L.  Cultures were negative.  He was treated with lactulose with resolution of his encephalopathy. ?Patient has seen Dr. Burr Medico, in follow-up on 2/27.  Tecentriq was discontinued as he was not considered a candidate for any cancer treatment due to worsening liver failure.  He was seen by palliative care same day who reaffirmed his goals of care including CODE STATUS of DNR and referral to hospice.  He was scheduled for repeat paracentesis on 2/28 and planned for Pleurx catheter placement on 3/7. ?Palliative care consulted. Plan is for Pleurx catheter for comfort on 10/31/2021 and then discharge [facility] as per bed availability. ? ?Assessment and Plan: ?* SBP (spontaneous bacterial peritonitis) ([place]) ?He presented with increasing abdominal pain, recurrent abdominal distention after paracentesis 2/28 (4 L removed), leukocytosis.  Diagnostic paracentesis performed in ED, cell count shows 395 WBCs with 75% neutrophils.  Lipase also elevated at 195. ?Continue IV ceftriaxone 2 g daily ?Continue analgesics as needed. ?Patient need Pleurx catheter for comfort tentatively scheduled for 10/31/2021. ?Patient will be discharged [facility] afterwards. ? ?Esophageal varices in cirrhosis (HCC) ?S/p banding on 09/29/2021 by GI, Dr. Rush Landmark. ?GI consulted.   Since patient was considered hospice candidate.  GI will consult if there is any intervention needed. ? ?Hyponatremia ?Sodium 124 in setting of hepatic cirrhosis and ascites. ? ?DNR (do not resuscitate)/DNI(Do Not Intubate) ?Seen by palliative care on 2/27.  DNR status confirmed and he was referred to hospice care.   ?Patient will be discharged to [facility] facility once Pleurx catheter placed tomorrow. ? ?Hepatic cirrhosis due to chronic hepatitis C infection ([place]) ?Hepatocellular carcinoma ?Approaching End-stage liver disease.  No longer candidate for anticancer treatment due to worsening liver failure per oncology.  LFTs remain persistently elevated.  MELD score is 29 which correlates to a 19.6% 46-monthmortality. ? ?Hyperkalemia-resolved as of 10/27/2021 ?Potassium 6.0 on admission without EKG changes. ?Lokelma given, insulin D50 given ?Continue oral Lasix, hold spironolactone ? ? ?Subjective: Patient was seen and examined at bedside.  Overnight events noted. ?Patient appears very frail, thin built, chronically ill looking with distended abdomen. ?He is lying comfortably in the bed, awaiting Pleurx catheter and discharge. ? ?Physical Exam: ?Vitals:  ? 10/29/21 1311 10/29/21 2031 10/30/21 0500 10/30/21 0523  ?BP: (!) 143/93 (!) 125/98  126/78  ?Pulse: 96 81  85  ?Resp: '18 16  18  '$ ?Temp: 97.6 ?F (36.4 ?C) 97.7 ?F (36.5 ?C)  97.7 ?F (36.5 ?C)  ?TempSrc: Oral Oral  Oral  ?SpO2: 100% 100%  100%  ?Weight:   59 kg   ?Height:      ? ?General exam: Appears chronically ill looking, thin, frail, cachectic but comfortable. ?Respiratory : Decreased breath sounds, normal respiratory effort, RR 20 ?Cardiovascular : S1-S2 heard, regular rate and rhythm, no murmur. ?Gastrointestinal : Abdomen is soft, distended, mildly tender, BS+ ?Central nervous system: Alert and oriented x 2,  no focal neurological deficits. ?Extremities: No edema, no cyanosis, no clubbing. ?Psychiatry: Mood, normal, insight and judgment  appropriate ? ? ?Data Reviewed: ?I have Reviewed nursing notes, Vitals, and Lab results since pt's last encounter. Pertinent lab results CBC, BMP ?I have ordered test including none ?I have reviewed the last note from palliative care, IR,  ?I have discussed pt's care plan and test results with patient.  ? ?Family Communication: No family at bedside ? ?Disposition: ?Status is: Inpatient ?Remains inpatient appropriate because: Admitted for spontaneous bacterial peritonitis requiring IV antibiotics.  Palliative care was consulted.  Patient will eventually need Pleurx catheter for comfort in discharged to [facility]. ? ? Planned Discharge Destination:  [facility]   on 10/31/21. ? ?Time spent: 35 minutes ? ?Author: ?Shawna Clamp, MD ?10/30/2021 12:05 PM ? ?For on call review [email or web address]  ? ?

## 2021-10-30 NOTE — Procedures (Signed)
Vascular and Interventional Radiology Procedure Note ? ?Patient: Patrick Tran ?DOB: 02/25/1971 ?Medical Record Number: [id number] ?Note Date/Time: 10/30/21 5:54 PM  ? ?Performing Physician: Michaelle Birks, MD ?Assistant(s): None ? ?Diagnosis:  [place]. Refractory ascites. ? ?Procedure:  ?TUNNELED PERITONEAL (Pleurx) DRAINAGE CATHETER PLACEMENT ?THERAPEUTIC PARACENTESIS ? ?Anesthesia: Conscious Sedation ?Complications: None ?Estimated Blood Loss: Minimal ?Specimens:  Sent None ? ?Findings:  ?The RightLower quadrant peritoneal space was accessed with a Yueh Needle  ?Successful Ultrasound and Fluoroscopy-guided tunneled peritoneal drain placement. ?1200 mL of fluid was obtained. ? ?Plan:  ?Intermittent draining and schedule per Primary Team. ? ?See detailed procedure note with images in PACS. ?The patient tolerated the procedure well without incident or complication and was returned to Floor Bed in stable condition.  ? ? ?Michaelle Birks, MD ?[facility] ?[facility] ? ? ?Pager. [phone] ?Clinic. [phone]  ?

## 2021-10-30 NOTE — Consult Note (Signed)
Chief Complaint: Patient was seen in consultation today for  Chief Complaint  Patient presents with   Abdominal Pain    Referring Physician(s): Dr. Dwyane Dee   Supervising Physician: Michaelle Birks  Patient Status: Patrick Tran  History of Present Illness: Patrick Tran is a 51 y.o. male with a medical history significant for cirrhosis, portal hypertension with esophageal varices s/p banding 09/29/21, Hepatitis C and hepatocellular carcinoma with recurrent malignant ascites. He was recently admitted 10/16/21-10/21/21 for hepatic encephalopathy and was treated with lactulose. He was admitted again 10/25/21 for spontaneous bacterial peritonitis and has been receiving IV antibiotics.   The patient had previously been receiving treatment for hepatocellular carcinoma but due to worsening liver failure these medications were discontinued. He is not a candidate for any further therapy and now has pending discharge plans to [facility]).   He is familiar to IR from multiple paracenteses since mid-January 2023 with each procedure yielding between one and almost five liters. Last paracentesis was 10/24/21. Interventional Radiology has been asked to evaluate this patient for an image-guided tunneled peritoneal catheter prior to discharge to [facility] to assist with management of ascites while under hospice care.   Past Medical History:  Diagnosis Date   CAP (community acquired pneumonia)    Cirrhosis ([place])    Hepatitis C virus infection    Hepatocellular carcinoma ([place])     Past Surgical History:  Procedure Laterality Date   BIOPSY  09/29/2021   Procedure: BIOPSY;  Surgeon: Rush Landmark Telford Nab., MD;  Location: [facility];  Service: Gastroenterology;;   ESOPHAGEAL BANDING  09/29/2021   Procedure: ESOPHAGEAL BANDING;  Surgeon: Irving Copas., MD;  Location: [facility];  Service: Gastroenterology;;   ESOPHAGOGASTRODUODENOSCOPY (EGD) WITH PROPOFOL N/A 09/29/2021   Procedure:  ESOPHAGOGASTRODUODENOSCOPY (EGD) WITH PROPOFOL;  Surgeon: Irving Copas., MD;  Location: [facility];  Service: Gastroenterology;  Laterality: N/A;   IR PARACENTESIS  09/08/2021   IR PARACENTESIS  10/04/2021    Allergies: Patient has no known allergies.  Medications: Prior to Admission medications   Medication Sig Start Date End Date Taking? Authorizing Provider  Amoxicill-Rifabutin-Omeprazole (TALICIA) 025-42.7-06 MG CPDR Take 4 capsules by mouth every 8 (eight) hours. 10/03/21  Yes Pyrtle, Lajuan Lines, MD  furosemide (LASIX) 20 MG tablet Take 2 tablets (40 mg total) by mouth daily. 10/21/21 11/20/21 Yes Elodia Florence., MD  lactulose (CHRONULAC) 10 GM/15ML solution Take 30 mLs (20 g total) by mouth 3 (three) times daily. Take at least once daily for a goal of 3 BM's a day.  Titrate for 3 bowel movements a day. 10/21/21 11/20/21 Yes Elodia Florence., MD  Morphine Sulfate (MORPHINE CONCENTRATE) 10 mg / 0.5 ml concentrated solution Take 0.5-1 mLs (10-20 mg total) by mouth every 2 (two) hours as needed for severe pain. 10/25/21  Yes Pickenpack-Cousar, Carlena Sax, NP  spironolactone (ALDACTONE) 100 MG tablet Take 1 tablet (100 mg total) by mouth daily. 10/21/21 11/20/21 Yes Elodia Florence., MD  omeprazole (PRILOSEC) 40 MG capsule Take 1 capsule (40 mg total) by mouth 2 (two) times daily before a meal. Patient not taking: Reported on 10/25/2021 09/29/21   Mansouraty, Telford Nab., MD  ondansetron (ZOFRAN) 4 MG tablet Take 1 tablet (4 mg total) by mouth every 6 (six) hours as needed for nausea or vomiting. Patient not taking: Reported on 10/25/2021 10/23/21   Pickenpack-Cousar, Carlena Sax, NP     Family History  Family history unknown: Yes    Social History  Socioeconomic History   Marital status: Single    Spouse name: Not on file   Number of children: Not on file   Years of education: Not on file   Highest education level: Not on file  Occupational History   Not on file  Tobacco Use    Smoking status: Never   Smokeless tobacco: Not on file  Substance and Sexual Activity   Alcohol use: Not Currently    Comment: social    Drug use: No   Sexual activity: Yes  Other Topics Concern   Not on file  Social History Narrative   Not on file   Social Determinants of Health   Financial Resource Strain: Not on file  Food Insecurity: Not on file  Transportation Needs: Not on file  Physical Activity: Not on file  Stress: Not on file  Social Connections: Not on file    Review of Systems: A 12 point ROS discussed and pertinent positives are indicated in the HPI above.  All other systems are negative.  Review of Systems  Constitutional:  Negative for appetite change and fatigue.  Respiratory:  Negative for cough and shortness of breath.   Cardiovascular:  Negative for chest pain and leg swelling.  Gastrointestinal:  Positive for abdominal distention and abdominal pain. Negative for diarrhea, nausea and vomiting.   Vital Signs: BP 126/78 (BP Location: Left Arm)    Pulse 85    Temp 97.7 F (36.5 C) (Oral)    Resp 18    Ht '5\' 5"'$  (1.651 m)    Wt 130 lb 1.6 oz (59 kg) Comment: standing scale   SpO2 100%    BMI 21.65 kg/m   Physical Exam Constitutional:      Appearance: He is underweight. He is ill-appearing.  HENT:     Mouth/Throat:     Mouth: Mucous membranes are moist.     Pharynx: Oropharynx is clear.  Cardiovascular:     Rate and Rhythm: Normal rate and regular rhythm.     Pulses: Normal pulses.     Heart sounds: Normal heart sounds.  Pulmonary:     Effort: Pulmonary effort is normal.     Breath sounds: Normal breath sounds.  Abdominal:     General: There is distension.     Tenderness: There is abdominal tenderness.  Skin:    General: Skin is warm and dry.  Neurological:     Mental Status: He is alert and oriented to person, place, and time.    Imaging: CT HEAD WO CONTRAST  Result Date: 10/16/2021 CLINICAL DATA:  Mental status change EXAM: CT HEAD  WITHOUT CONTRAST TECHNIQUE: Contiguous axial images were obtained from the base of the skull through the vertex without intravenous contrast. RADIATION DOSE REDUCTION: This exam was performed according to the departmental dose-optimization program which includes automated exposure control, adjustment of the mA and/or kV according to patient size and/or use of iterative reconstruction technique. COMPARISON:  CT brain report 08/19/2009 FINDINGS: Brain: No evidence of acute infarction, hemorrhage, hydrocephalus, extra-axial collection or mass lesion/mass effect. Vascular: No hyperdense vessel or unexpected calcification. Skull: Normal. Negative for fracture or focal lesion. Sinuses/Orbits: No acute finding. Other: None IMPRESSION: Negative non contrasted CT appearance of the brain. Electronically Signed   By: Donavan Foil M.D.   On: 10/16/2021 20:40   US Paracentesis  Result Date: 10/24/2021 INDICATION: Patient with history of hepatocellular carcinoma, recurrent ascites. Request for therapeutic paracentesis. EXAM: ULTRASOUND GUIDED THERAPEUTIC PARACENTESIS MEDICATIONS: 10 mL 1% lidocaine COMPLICATIONS: None  immediate. PROCEDURE: Informed written consent was obtained from the patient after a discussion of the risks, benefits and alternatives to treatment. A timeout was performed prior to the initiation of the procedure. Initial ultrasound scanning demonstrates a large amount of ascites within the right lower abdominal quadrant. The right lower abdomen was prepped and draped in the usual sterile fashion. 1% lidocaine was used for local anesthesia. Following this, a 19 gauge, 7-cm, Yueh catheter was introduced. An ultrasound image was saved for documentation purposes. The paracentesis was performed. The catheter was removed and a dressing was applied. The patient tolerated the procedure well without immediate post procedural complication. FINDINGS: A total of approximately 4.0 L of clear yellow fluid was removed.  IMPRESSION: Successful ultrasound-guided paracentesis yielding 4.0 liters of peritoneal fluid. Read by Candiss Norse, PA-C Electronically Signed   By: Michaelle Birks M.D.   On: 10/24/2021 17:04   US Paracentesis  Result Date: 10/20/2021 INDICATION: Patient with history hepatocellular carcinoma with recurrent ascites. Request is for therapeutic paracentesis. EXAM: ULTRASOUND GUIDED THERAPEUTIC  PARACENTESIS MEDICATIONS: Lidocaine 1% 10 mL COMPLICATIONS: None immediate. PROCEDURE: Informed written consent was obtained from the patient after a discussion of the risks, benefits and alternatives to treatment. A timeout was performed prior to the initiation of the procedure. Initial ultrasound scanning demonstrates a moderate amount of ascites within the right lower abdominal quadrant. The right lower abdomen was prepped and draped in the usual sterile fashion. 1% lidocaine was used for local anesthesia. Following this, a 19 gauge, 7-cm, Yueh catheter was introduced. An ultrasound image was saved for documentation purposes. The paracentesis was performed. The catheter was removed and a dressing was applied. The patient tolerated the procedure well without immediate post procedural complication. FINDINGS: A total of approximately 4.8 L of straw colored fluid was removed. IMPRESSION: Successful ultrasound-guided paracentesis yielding 4.8 liters of peritoneal fluid. Read by: Rushie Nyhan, NP Electronically Signed   By: Lucrezia Europe M.D.   On: 10/20/2021 16:35   US Paracentesis  Result Date: 10/18/2021 INDICATION: Patient with history of hepatocellular carcinoma with recurrent ascites. Request for IR to perform diagnostic and therapeutic paracentesis with a 2 L max. EXAM: ULTRASOUND GUIDED DIAGNOSTIC AND THERAPEUTIC LEFT LOWER QUADRANT PARACENTESIS MEDICATIONS: 10 mL 1 % lidocaine COMPLICATIONS: None immediate. PROCEDURE: Informed written consent was obtained from the patient after a discussion of the risks,  benefits and alternatives to treatment. A timeout was performed prior to the initiation of the procedure. Initial ultrasound scanning demonstrates a large amount of ascites within the left lower abdominal quadrant. The left lower abdomen was prepped and draped in the usual sterile fashion. 1% lidocaine was used for local anesthesia. Following this, a 19 gauge, 7-cm, Yueh catheter was introduced. An ultrasound image was saved for documentation purposes. The paracentesis was performed. The catheter was removed and a dressing was applied. The patient tolerated the procedure well without immediate post procedural complication. FINDINGS: A total of approximately 2 L of clear, yellow fluid was removed. Samples were sent to the laboratory as requested by the clinical team. IMPRESSION: Successful ultrasound-guided paracentesis yielding 2 liters of peritoneal fluid. Read by: Narda Rutherford, AGNP-BC Electronically Signed   By: Corrie Mckusick D.O.   On: 10/18/2021 16:45   DG CHEST PORT 1 VIEW  Result Date: 10/18/2021 CLINICAL DATA:  Altered mental status with acute metabolic encephalopathy. EXAM: PORTABLE CHEST 1 VIEW COMPARISON:  Chest radiograph dated 10/16/2021. FINDINGS: The patient is rotated to the left which limits the sensitivity of the exam. Left  basilar opacities may represent airspace opacities or overlying cardiac shadow. The right lung is clear. There is no pneumothorax or pleural effusion. The osseous structures appear intact. IMPRESSION: Left basilar opacities may represent airspace opacities or overlying cardiac shadow given patient positioning. Electronically Signed   By: Zerita Boers M.D.   On: 10/18/2021 10:21   DG Chest Port 1 View  Result Date: 10/16/2021 CLINICAL DATA:  Altered mental status. EXAM: PORTABLE CHEST 1 VIEW COMPARISON:  None. FINDINGS: Mild, stable right-sided volume loss is seen with mild, stable atelectatic changes noted within the right lung base. There is no evidence of an acute  infiltrate, pleural effusion or pneumothorax. The heart size and mediastinal contours are within normal limits. The visualized skeletal structures are unremarkable. IMPRESSION: Stable right-sided volume loss with mild, stable right basilar atelectasis. Electronically Signed   By: Virgina Norfolk M.D.   On: 10/16/2021 19:38   EEG adult  Result Date: 10/20/2021 Lora Havens, MD     10/20/2021 11:12 AM Patient Name: Patrick Tran MRN: [id number] Epilepsy Attending: Lora Havens Referring Physician/Provider: Elodia Florence., MD Date: 10/19/2021 Duration: 22.14 mins Patient history: 52 year old male with altered mental status.  EEG to evaluate for seizure. Level of alertness: Awake AEDs during EEG study: None Technical aspects: This EEG study was done with scalp electrodes positioned according to the 10-20 International system of electrode placement. Electrical activity was acquired at a sampling rate of '500Hz'$  and reviewed with a high frequency filter of '70Hz'$  and a low frequency filter of '1Hz'$ . EEG data were recorded continuously and digitally stored. Description: No clear posterior dominant rhythm was seen.  EEG showed continuous generalized 3 to 5 Hz theta- delta slowing.  Intermittent generalized triphasic waves were noted.  Hyperventilation and photic stimulation were not performed.   ABNORMALITY - Continuous slow, generalized -Triphasic wave, generalized IMPRESSION: This study is suggestive of moderate diffuse encephalopathy, nonspecific etiology but most likely secondary to toxic-metabolic causes.  No seizures or definite epileptiform discharges were seen throughout the recording. Priyanka [name]   IR Paracentesis  Result Date: 10/04/2021 INDICATION: Patient with a history of hepatocellular carcinoma with recurrent ascites presents today for therapeutic paracentesis. EXAM: ULTRASOUND GUIDED PARACENTESIS MEDICATIONS: 1% lidocaine 10 mL COMPLICATIONS: None immediate. PROCEDURE: Informed  written consent was obtained from the patient after a discussion of the risks, benefits and alternatives to treatment. A timeout was performed prior to the initiation of the procedure. Initial ultrasound scanning demonstrates a large amount of ascites within the right lower abdominal quadrant. The right lower abdomen was prepped and draped in the usual sterile fashion. 1% lidocaine was used for local anesthesia. Following this, a 19 gauge, 7-cm, Yueh catheter was introduced. An ultrasound image was saved for documentation purposes. The paracentesis was performed. The catheter was removed and a dressing was applied. The patient tolerated the procedure well without immediate post procedural complication. FINDINGS: A total of approximately 2.4 L of clear yellow fluid was removed. IMPRESSION: Successful ultrasound-guided paracentesis yielding 2.4 liters of peritoneal fluid. Read by: Soyla Dryer, NP Electronically Signed   By: Corrie Mckusick D.O.   On: 10/04/2021 15:41    Labs:  CBC: Recent Labs    10/21/21 0334 10/23/21 0857 10/25/21 2024 10/26/21 0409  WBC 8.7 11.8* 18.7* 15.9*  HGB 11.5* 11.4* 11.3* 10.6*  HCT 33.3* 32.1* 32.6* 30.1*  PLT 135* 124* 136* 119*    COAGS: Recent Labs    09/27/21 0935 10/17/21 0836 10/25/21 2024 10/26/21 0409  INR  1.9* 1.8* 1.9* 2.1*    BMP: Recent Labs    10/23/21 0857 10/25/21 2024 10/26/21 0409 10/27/21 0858  NA 126* 121* 122* 124*  K 4.8 6.0* 5.6* 4.6  CL 100 95* 98 99  CO2 22 20* 19* 20*  GLUCOSE 125* 88 95 118*  BUN 21* 22* 24* 30*  CALCIUM 7.9* 7.9* 7.6* 7.5*  CREATININE 0.77 0.65 0.81 0.74  GFRNONAA >60 >60 >60 >60    LIVER FUNCTION TESTS: Recent Labs    10/23/21 0857 10/25/21 2024 10/26/21 0409 10/27/21 0858  BILITOT 8.7* 9.8* 8.2* 7.5*  AST 370* 419* 363* 379*  ALT 204* 203* 169* 175*  ALKPHOS 260* 265* 223* 213*  PROT 8.3* 8.7* 7.2 7.4  ALBUMIN 2.0* 1.6* <1.5* <1.5*    TUMOR MARKERS: No results for input(s): AFPTM,  CEA, CA199, CHROMGRNA in the last 8760 hours.  Assessment and Plan:  Cirrhosis; hepatocellular carcinoma; recurrent malignant ascites; pending discharge to hospice home: Patrick Tran, 51 year old male, is tentatively scheduled 10/31/21 for an iimage-guied tunneled peritoneal catheter. The procedure was discussed and consent obtained with the assistance of a Kinyarwanda-speaking video interpreter. The signed consent is on the patient's chart.  Risks and benefits discussed with the patient including bleeding, infection, damage to adjacent structures, bowel perforation/fistula connection, and sepsis.  All of the patient's questions were answered, patient is agreeable to proceed. He will be NPO at midnight. CBC and PT/INR have been ordered.   Thank you for this interesting consult.  I greatly enjoyed meeting Patrick Tran and look forward to participating in their care.  A copy of this report was sent to the requesting provider on this date.  Electronically Signed: Soyla Dryer, AGACNP-BC [phone] 10/30/2021, 10:15 AM   I spent a total of 20 Minutes    in face to face in clinical consultation, greater than 50% of which was counseling/coordinating care for image-guided percutaneous tunneled peritoneal catheter.

## 2021-10-31 ENCOUNTER — Inpatient Hospital Stay (HOSPITAL_COMMUNITY): Admission: RE | Admit: 2021-10-31 | Payer: BC Managed Care – PPO | Source: Ambulatory Visit

## 2021-10-31 HISTORY — PX: IR PERC TUN PERIT CATH W/PORT S&I /IMAG: IMG2328

## 2021-10-31 LAB — CULTURE, BLOOD (ROUTINE X 2)
Culture: NO GROWTH
Culture: NO GROWTH
Special Requests: ADEQUATE

## 2021-10-31 LAB — CBC
HCT: 30.2 % — ABNORMAL LOW (ref 39.0–52.0)
Hemoglobin: 10.5 g/dL — ABNORMAL LOW (ref 13.0–17.0)
MCH: 35.4 pg — ABNORMAL HIGH (ref 26.0–34.0)
MCHC: 34.8 g/dL (ref 30.0–36.0)
MCV: 101.7 fL — ABNORMAL HIGH (ref 80.0–100.0)
Platelets: 109 10*3/uL — ABNORMAL LOW (ref 150–400)
RBC: 2.97 MIL/uL — ABNORMAL LOW (ref 4.22–5.81)
RDW: 16.8 % — ABNORMAL HIGH (ref 11.5–15.5)
WBC: 7.4 10*3/uL (ref 4.0–10.5)
nRBC: 0 % (ref 0.0–0.2)

## 2021-10-31 LAB — PROTIME-INR
INR: 2.1 — ABNORMAL HIGH (ref 0.8–1.2)
Prothrombin Time: 23.3 seconds — ABNORMAL HIGH (ref 11.4–15.2)

## 2021-10-31 MED ORDER — HYDROXYZINE HCL 10 MG PO TABS: 10.0000 mg | ORAL_TABLET | Freq: Three times a day (TID) | ORAL | 0 refills | Status: DC | PRN

## 2021-10-31 MED ORDER — ONDANSETRON HCL 4 MG PO TABS: 4.0000 mg | ORAL_TABLET | Freq: Four times a day (QID) | ORAL | 0 refills | Status: DC | PRN

## 2021-10-31 MED ORDER — ENSURE ENLIVE PO LIQD: 237.0000 mL | Freq: Two times a day (BID) | ORAL | 12 refills | Status: DC

## 2021-10-31 NOTE — Progress Notes (Signed)
[facility] Hospital Liaison Note ? ?Bed offered and accepted at [facility] for transfer on wedesday 3.8.23. Consents will need to be completed prior to transfer. ? ?Unit RN please call report to [phone] prior to patient leaving the unit.  ? ?Please send signed DNR and paperwork with patient. Call with any questions or concerns. Thank you ? ?TOC, please wait to hear from [facility] liaison prior to setting up transport.  ? ?Roselee Nova, LCSW ?[facility] Liaison ?[phone] ?

## 2021-10-31 NOTE — Discharge Instructions (Signed)
Patient is being discharged to [facility] for hospice. ?Patient is end-stage liver disease, hepatocellular carcinoma has recurrent ascites. ?Patient underwent Pleurx catheter for comfort. ?

## 2021-10-31 NOTE — Discharge Summary (Addendum)
Physician Discharge Summary   Patient: Patrick Tran MRN: [id number] DOB: 1971/03/02  Admit date:     10/25/2021  Discharge date: 11/01/21  Discharge Physician: Kayleen Memos   PCP: Patient, No Pcp Per (Inactive)   Recommendations at discharge:  Patient is being discharged to [facility] for hospice. Patient has end-stage liver disease, hepatocellular carcinoma and recurrent ascites. Patient underwent Pleurx catheter for comfort.  Discharge Diagnoses: Principal Problem:   SBP (spontaneous bacterial peritonitis) ([place]) Active Problems:   Hepatic cirrhosis due to chronic hepatitis C infection ([place])   Hepatocellular carcinoma ([place])   DNR (do not resuscitate)/DNI(Do Not Intubate)   Hyponatremia   Esophageal varices in cirrhosis [facility])  Resolved Problems:   Hyperkalemia  Hospital Course: This 51 yrs old [place] speaking male with PMH significant for hepatic cirrhosis secondary to hepatitis C, hepatocellular carcinoma, portal hypertension with esophageal varices s/p banding on 09/29/2021, ascites, history of hepatic encephalopathy who is admitted with spontaneous bacterial peritonitis. Patient recently admitted from 10/16/2021-10/21/2021 for hepatic encephalopathy in setting of known cirrhosis with ascites.  He underwent paracentesis on 2/22 with 2 L off and 2/24 with removal of 4.8 L.  Cultures were negative.  He was treated with lactulose with resolution of his encephalopathy. Patient has seen Dr. Burr Medico, in follow-up on 2/27.  Tecentriq was discontinued as he was not considered a candidate for any cancer treatment due to worsening liver failure.  He was seen by palliative care same day who reaffirmed his goals of care including CODE STATUS of DNR and referral to hospice.  He was scheduled for repeat paracentesis on 2/28 and planned for Pleurx catheter placement on 3/7. Palliative care consulted. Plan is for Pleurx catheter for comfort on 10/31/2021 and then discharge to [facility] as per  bed availability. Patient underwent successful catheter for comfort on 10/30/2021, tolerated well.  Patient is being discharged to [facility] today.  Assessment and Plan: * SBP (spontaneous bacterial peritonitis) ([place]) He presented with increasing abdominal pain, recurrent abdominal distention after paracentesis 2/28 (4 L removed), leukocytosis.  Diagnostic paracentesis performed in ED, cell count shows 395 WBCs with 75% neutrophils.  Lipase also elevated at 195. Continued IV ceftriaxone 2 g daily Continue analgesics as needed. Patient underwent Pleurx catheter for comfort on 10/30/21 Patient is being discharged to [facility] for hospice.  Esophageal varices in cirrhosis ([facility]) S/p banding on 09/29/2021 by GI, Dr. Rush Landmark. GI consulted.  Since patient was considered hospice candidate.  GI will consult if there is any intervention needed.  Hyponatremia Sodium 124 in setting of hepatic cirrhosis and ascites.  DNR (do not resuscitate)/DNI(Do Not Intubate) Seen by palliative care on 2/27.  DNR status confirmed and he was referred to hospice care.   Patient being discharged to [facility] facility today.  Hepatic cirrhosis due to chronic hepatitis C infection ([place]) Hepatocellular carcinoma Approaching End-stage liver disease.  No longer candidate for anticancer treatment due to worsening liver failure per oncology.  LFTs remain persistently elevated.  MELD score is 29 which correlates to a 19.6% 67-monthmortality.  Hyperkalemia-resolved as of 10/27/2021 Potassium 6.0 on admission without EKG changes. Lokelma given, insulin D50 given Continue oral Lasix, hold spironolactone   [place] control [place]. and patient was instructed, not to drive, operate heavy machinery, perform activities at heights, swimming or participation in water activities or provide baby-sitting services while on Pain, Sleep and Anxiety Medications;  until their outpatient Physician has advised to do  so again. Also recommended to not to take more than prescribed Pain, Sleep and Anxiety Medications.   Consultants: Gastroenterology, IR Procedures performed: Pleurx catheter for recurrent ascites Disposition: Hospice care Diet recommendation:  Discharge Diet Orders (From admission, onward)     Start     Ordered   10/31/21 0000  Diet - low sodium heart healthy        10/31/21 1020   10/31/21 0000  Diet general        10/31/21 1020           Carb modified diet DISCHARGE MEDICATION: Allergies as of 11/01/2021   No Known Allergies      Medication List     STOP taking these medications    omeprazole 40 MG capsule Commonly known as: PRILOSEC   Talicia 353-61.4-43 MG Cpdr Generic drug: Amoxicill-Rifabutin-Omeprazole       TAKE these medications    feeding supplement Liqd Take 237 mLs by mouth 2 (two) times daily between meals.   furosemide 20 MG tablet Commonly known as: LASIX Take 2 tablets (40 mg total) by mouth daily.   hydrOXYzine 10 MG tablet Commonly known as: ATARAX Take 1 tablet (10 mg total) by mouth 3 (three) times daily as needed for itching.   lactulose 10 GM/15ML solution Commonly known as: CHRONULAC Take 30 mLs (20 g total) by mouth 3 (three) times daily. Take at least once daily for a goal of 3 BM's a day.  Titrate for 3 bowel movements a day.   morphine CONCENTRATE 10 mg / 0.5 ml concentrated solution Take 0.5-1 mLs (10-20 mg total) by mouth every 2 (two) hours as needed for severe pain.   ondansetron 4 MG tablet Commonly known as: ZOFRAN Take 1 tablet (4 mg total) by mouth every 6 (six) hours as needed for nausea. What changed: reasons to take this   spironolactone 100 MG tablet Commonly known as: ALDACTONE Take 1 tablet (100 mg total) by mouth daily.               Discharge Care Instructions  (From admission, onward)           Start     Ordered   10/31/21 0000  Discharge  wound care:       Comments: Follow-up at [facility].   10/31/21 1020            Discharge Exam: Filed Weights   10/30/21 0500 10/31/21 0559 11/01/21 0500  Weight: 59 kg 58.7 kg 59.9 kg   General exam: Appears comfortable, chronically ill looking, thin, frail, not in any acute distress. Respiratory system: Clear to auscultation bilaterally, no wheezing, no crackles, normal respiratory effort. Cardiovascular system: S1-S2 heard, regular rate and rhythm, no murmur. Gastrointestinal system: Abdomen is soft, distended, mildly tender, BS+, Pleurx catheter noted on the right. Central nervous system: Alert, oriented x3, no focal neurological deficits. Extremities: Edema+, no cyanosis, no clubbing. Psychiatry: Mood, insight and judgment appropriate.   Condition at discharge: good  The results of significant diagnostics from this hospitalization (including imaging, microbiology, ancillary and laboratory) are listed below for reference.   Imaging Studies: CT HEAD WO CONTRAST  Result Date: 10/16/2021 CLINICAL DATA:  Mental status change EXAM: CT HEAD WITHOUT CONTRAST TECHNIQUE: Contiguous axial images were obtained from the base of the skull through the vertex without intravenous contrast. RADIATION DOSE REDUCTION: This exam was performed according to the departmental dose-optimization program which includes automated exposure control, adjustment of the mA and/or kV according to patient  size and/or use of iterative reconstruction technique. COMPARISON:  CT brain report 08/19/2009 FINDINGS: Brain: No evidence of acute infarction, hemorrhage, hydrocephalus, extra-axial collection or mass lesion/mass effect. Vascular: No hyperdense vessel or unexpected calcification. Skull: Normal. Negative for fracture or focal lesion. Sinuses/Orbits: No acute finding. Other: None IMPRESSION: Negative non contrasted CT appearance of the brain. Electronically Signed   By: Donavan Foil M.D.   On: 10/16/2021 20:40    US Paracentesis  Result Date: 10/24/2021 INDICATION: Patient with history of hepatocellular carcinoma, recurrent ascites. Request for therapeutic paracentesis. EXAM: ULTRASOUND GUIDED THERAPEUTIC PARACENTESIS MEDICATIONS: 10 mL 1% lidocaine COMPLICATIONS: None immediate. PROCEDURE: Informed written consent was obtained from the patient after a discussion of the risks, benefits and alternatives to treatment. A timeout was performed prior to the initiation of the procedure. Initial ultrasound scanning demonstrates a large amount of ascites within the right lower abdominal quadrant. The right lower abdomen was prepped and draped in the usual sterile fashion. 1% lidocaine was used for local anesthesia. Following this, a 19 gauge, 7-cm, Yueh catheter was introduced. An ultrasound image was saved for documentation purposes. The paracentesis was performed. The catheter was removed and a dressing was applied. The patient tolerated the procedure well without immediate post procedural complication. FINDINGS: A total of approximately 4.0 L of clear yellow fluid was removed. IMPRESSION: Successful ultrasound-guided paracentesis yielding 4.0 liters of peritoneal fluid. Read by Candiss Norse, PA-C Electronically Signed   By: Michaelle Birks M.D.   On: 10/24/2021 17:04   US Paracentesis  Result Date: 10/20/2021 INDICATION: Patient with history hepatocellular carcinoma with recurrent ascites. Request is for therapeutic paracentesis. EXAM: ULTRASOUND GUIDED THERAPEUTIC  PARACENTESIS MEDICATIONS: Lidocaine 1% 10 mL COMPLICATIONS: None immediate. PROCEDURE: Informed written consent was obtained from the patient after a discussion of the risks, benefits and alternatives to treatment. A timeout was performed prior to the initiation of the procedure. Initial ultrasound scanning demonstrates a moderate amount of ascites within the right lower abdominal quadrant. The right lower abdomen was prepped and draped in the usual  sterile fashion. 1% lidocaine was used for local anesthesia. Following this, a 19 gauge, 7-cm, Yueh catheter was introduced. An ultrasound image was saved for documentation purposes. The paracentesis was performed. The catheter was removed and a dressing was applied. The patient tolerated the procedure well without immediate post procedural complication. FINDINGS: A total of approximately 4.8 L of straw colored fluid was removed. IMPRESSION: Successful ultrasound-guided paracentesis yielding 4.8 liters of peritoneal fluid. Read by: Rushie Nyhan, NP Electronically Signed   By: Lucrezia Europe M.D.   On: 10/20/2021 16:35   US Paracentesis  Result Date: 10/18/2021 INDICATION: Patient with history of hepatocellular carcinoma with recurrent ascites. Request for IR to perform diagnostic and therapeutic paracentesis with a 2 L max. EXAM: ULTRASOUND GUIDED DIAGNOSTIC AND THERAPEUTIC LEFT LOWER QUADRANT PARACENTESIS MEDICATIONS: 10 mL 1 % lidocaine COMPLICATIONS: None immediate. PROCEDURE: Informed written consent was obtained from the patient after a discussion of the risks, benefits and alternatives to treatment. A timeout was performed prior to the initiation of the procedure. Initial ultrasound scanning demonstrates a large amount of ascites within the left lower abdominal quadrant. The left lower abdomen was prepped and draped in the usual sterile fashion. 1% lidocaine was used for local anesthesia. Following this, a 19 gauge, 7-cm, Yueh catheter was introduced. An ultrasound image was saved for documentation purposes. The paracentesis was performed. The catheter was removed and a dressing was applied. The patient tolerated the procedure well without immediate  post procedural complication. FINDINGS: A total of approximately 2 L of clear, yellow fluid was removed. Samples were sent to the laboratory as requested by the clinical team. IMPRESSION: Successful ultrasound-guided paracentesis yielding 2 liters of  peritoneal fluid. Read by: Narda Rutherford, AGNP-BC Electronically Signed   By: Corrie Mckusick D.O.   On: 10/18/2021 16:45   IR Perc Tun Perit Cath W/Port  Result Date: 10/31/2021 INDICATION: Julian with refractory ascites requiring frequent paracentesis. EXAM: ULTRASOUND AND FLUOROSCOPIC GUIDED PLACEMENT OF TUNNELED PERITONEAL (PleurX) CATHETER COMPARISON:  Multiple IR ultrasound, most recently 10/24/2021. MRI abdomen, 09/08/2021. MEDICATIONS: Ancef 2 gm IV; Antibiotics were administered within an appropriate time frame prior to skin puncture. CONTRAST:  None. ANESTHESIA/SEDATION: Moderate (conscious) sedation was employed during this procedure. A total of Versed 2 mg and Fentanyl 100 mcg was administered intravenously. Moderate Sedation Time: 16 minutes. The patient's level of consciousness and vital signs were monitored continuously by radiology nursing throughout the procedure under my direct supervision. FLUOROSCOPY TIME:  0 minutes 48 seconds (2 mGy) COMPLICATIONS: None immediate. TECHNIQUE: Informed written consent was obtained from the the patient and/or patient's representative after a discussion of the risks, benefits and alternatives to treatment. Questions regarding the procedure were encouraged and answered. A timeout was performed prior to the initiation of the procedure. Ultrasound scanning of the RIGHT lower abdominal quadrant demonstrates a moderate amount of complex fluid within the right lower abdominal quadrant. The RIGHT lower abdomen was prepped and draped usual sterile fashion a sterile drape was applied, covering the operative table. Maximum barrier sterile technique with sterile gowns and gloves were used for the procedure. A timeout was performed prior to the initiation of the procedure. Local anesthesia was provided with 1% lidocaine with epinephrine. Under direct ultrasound guidance, an 52 gauge Yueh was advanced into the peritoneal space with the RIGHT abdominal quadrant. Appropriate  positioning was confirmed with the efflux of ascites. An Amplatz super stiff wire was advanced across the abdomen under intermittent fluoroscopic guidance. The access site was dilated over the Amplatz wire, allowing advancement of a peel-away sheath. The peritoneal catheter was tunneled in an antegrade fashion from a site along the right mid clavicular line to the access site and inserted through the peel-away sheath with tip ultimately terminating within LEFT lower quadrant. Several postprocedural spot abdominal radiographs were obtained. The catheter was connected to wall suction and approximately 1200 mL of serous ascites was aspirated. The access site was closed with an interrupted 4-0 Vicryl suture, Dermabond and Steri-Strips. Dressings were placed. The patient tolerated procedure well without immediate postprocedural complication. FINDINGS: After successful ultrasound and fluoroscopic guided peritoneal drainage catheter placement, the RIGHT lower quadrant abdominal approach drainage catheter terminates within the LEFT lower quadrant Following tunneled peritoneal drainage catheter placement, approximately 1200 mL of serous fluid was aspirated. IMPRESSION: Successful placement of a tunneled peritoneal drainage (PleurX) catheter, as above, followed by therapeutic paracentesis with 1200 mL of ascites aspirated. Michaelle Birks, MD [facility] Radiology Electronically Signed   By: Michaelle Birks M.D.   On: 10/31/2021 16:13   DG CHEST PORT 1 VIEW  Result Date: 10/18/2021 CLINICAL DATA:  Altered mental status with acute metabolic encephalopathy. EXAM: PORTABLE CHEST 1 VIEW COMPARISON:  Chest radiograph dated 10/16/2021. FINDINGS: The patient is rotated to the left which limits the sensitivity of the exam. Left basilar opacities may represent airspace opacities or overlying cardiac shadow. The right lung is clear. There is no pneumothorax or pleural effusion. The osseous  structures  appear intact. IMPRESSION: Left basilar opacities may represent airspace opacities or overlying cardiac shadow given patient positioning. Electronically Signed   By: Zerita Boers M.D.   On: 10/18/2021 10:21   DG Chest Port 1 View  Result Date: 10/16/2021 CLINICAL DATA:  Altered mental status. EXAM: PORTABLE CHEST 1 VIEW COMPARISON:  None. FINDINGS: Mild, stable right-sided volume loss is seen with mild, stable atelectatic changes noted within the right lung base. There is no evidence of an acute infiltrate, pleural effusion or pneumothorax. The heart size and mediastinal contours are within normal limits. The visualized skeletal structures are unremarkable. IMPRESSION: Stable right-sided volume loss with mild, stable right basilar atelectasis. Electronically Signed   By: Virgina Norfolk M.D.   On: 10/16/2021 19:38   EEG adult  Result Date: 10/20/2021 Lora Havens, MD     10/20/2021 11:12 AM Patient Name: Rithwik Schmieg MRN: [id number] Epilepsy Attending: Lora Havens Referring Physician/Provider: Elodia Florence., MD Date: 10/19/2021 Duration: 22.14 mins Patient history: 51 year old male with altered mental status.  EEG to evaluate for seizure. Level of alertness: Awake AEDs during EEG study: None Technical aspects: This EEG study was done with scalp electrodes positioned according to the 10-20 International system of electrode placement. Electrical activity was acquired at a sampling rate of '500Hz'$  and reviewed with a high frequency filter of '70Hz'$  and a low frequency filter of '1Hz'$ . EEG data were recorded continuously and digitally stored. Description: No clear posterior dominant rhythm was seen.  EEG showed continuous generalized 3 to 5 Hz theta- delta slowing.  Intermittent generalized triphasic waves were noted.  Hyperventilation and photic stimulation were not performed.   ABNORMALITY - Continuous slow, generalized -Triphasic wave, generalized IMPRESSION: This study is  suggestive of moderate diffuse encephalopathy, nonspecific etiology but most likely secondary to toxic-metabolic causes.  No seizures or definite epileptiform discharges were seen throughout the recording. Priyanka [name]   IR Paracentesis  Result Date: 10/04/2021 INDICATION: Patient with a history of hepatocellular carcinoma with recurrent ascites presents today for therapeutic paracentesis. EXAM: ULTRASOUND GUIDED PARACENTESIS MEDICATIONS: 1% lidocaine 10 mL COMPLICATIONS: None immediate. PROCEDURE: Informed written consent was obtained from the patient after a discussion of the risks, benefits and alternatives to treatment. A timeout was performed prior to the initiation of the procedure. Initial ultrasound scanning demonstrates a large amount of ascites within the right lower abdominal quadrant. The right lower abdomen was prepped and draped in the usual sterile fashion. 1% lidocaine was used for local anesthesia. Following this, a 19 gauge, 7-cm, Yueh catheter was introduced. An ultrasound image was saved for documentation purposes. The paracentesis was performed. The catheter was removed and a dressing was applied. The patient tolerated the procedure well without immediate post procedural complication. FINDINGS: A total of approximately 2.4 L of clear yellow fluid was removed. IMPRESSION: Successful ultrasound-guided paracentesis yielding 2.4 liters of peritoneal fluid. Read by: Soyla Dryer, NP Electronically Signed   By: Corrie Mckusick D.O.   On: 10/04/2021 15:41    Microbiology: Results for orders placed or performed during the hospital encounter of 10/25/21  Culture, blood (routine x 2)     Status: None   Collection Time: 10/25/21  9:02 PM   Specimen: BLOOD  Result Value Ref Range Status   Specimen Description   Final    BLOOD LEFT ANTECUBITAL Performed at [facility] [address] [place], [place] [address]    Special Requests   Final    BOTTLES DRAWN AEROBIC AND  ANAEROBIC Blood Culture results may not be optimal due to an excessive volume of blood received in culture bottles Performed at [facility] [address], [place], [place] [address]    Culture   Final    NO GROWTH 5 DAYS Performed at [facility], [place] [address] [place], [place] [address]    Report Status 10/31/2021 FINAL  Final  Culture, blood (routine x 2)     Status: None   Collection Time: 10/25/21  9:03 PM   Specimen: BLOOD  Result Value Ref Range Status   Specimen Description   Final    BLOOD RIGHT ANTECUBITAL Performed at [facility] [address], [place], [place] [address]    Special Requests   Final    BOTTLES DRAWN AEROBIC AND ANAEROBIC Blood Culture adequate volume Performed at [facility] [address], [place], [place] [address]    Culture   Final    NO GROWTH 5 DAYS Performed at [facility], [place] [address] [place], [place] [address]    Report Status 10/31/2021 FINAL  Final  Body fluid culture w Gram Stain     Status: Abnormal   Collection Time: 10/25/21 10:37 PM   Specimen: Ascitic; Body Fluid  Result Value Ref Range Status   Specimen Description   Final    ASCITIC Performed at [facility] [address], [place], [place] [address]    Special Requests   Final    NONE Performed at [facility], [place] [address], [place], [place] [address]    Gram Stain   Final    CYTOSPIN SMEAR WBC PRESENT,BOTH PMN AND MONONUCLEAR NO ORGANISMS SEEN Performed at [facility], [place] [address], [place], [place] [address]    Culture KLEBSIELLA PNEUMONIAE (A)  Final   Report Status 10/29/2021 FINAL  Final   Organism ID, Bacteria KLEBSIELLA PNEUMONIAE  Final      Susceptibility   Klebsiella pneumoniae - MIC*    AMPICILLIN RESISTANT Resistant     CEFAZOLIN <=4 SENSITIVE Sensitive     CEFEPIME <=0.12 SENSITIVE Sensitive     CEFTAZIDIME <=1 SENSITIVE  Sensitive     CEFTRIAXONE <=0.25 SENSITIVE Sensitive     CIPROFLOXACIN <=0.25 SENSITIVE Sensitive     GENTAMICIN <=1 SENSITIVE Sensitive     IMIPENEM <=0.25 SENSITIVE Sensitive     TRIMETH/SULFA <=20 SENSITIVE Sensitive     AMPICILLIN/SULBACTAM 4 SENSITIVE Sensitive     PIP/TAZO <=4 SENSITIVE Sensitive     * KLEBSIELLA PNEUMONIAE    Labs: CBC: Recent Labs  Lab 10/25/21 2024 10/26/21 0409 10/31/21 0409  WBC 18.7* 15.9* 7.4  NEUTROABS 15.0*  --   --   HGB 11.3* 10.6* 10.5*  HCT 32.6* 30.1* 30.2*  MCV 100.0 98.7 101.7*  PLT 136* 119* 607*   Basic Metabolic Panel: Recent Labs  Lab 10/25/21 2024 10/26/21 0409 10/27/21 0858  NA 121* 122* 124*  K 6.0* 5.6* 4.6  CL 95* 98 99  CO2 20* 19* 20*  GLUCOSE 88 95 118*  BUN 22* 24* 30*  CREATININE 0.65 0.81 0.74  CALCIUM 7.9* 7.6* 7.5*   Liver Function Tests: Recent Labs  Lab 10/25/21 2024 10/26/21 0409 10/27/21 0858  AST 419* 363* 379*  ALT 203* 169* 175*  ALKPHOS 265* 223* 213*  BILITOT 9.8* 8.2* 7.5*  PROT 8.7* 7.2 7.4  ALBUMIN 1.6* <1.5* <1.5*   CBG: Recent Labs  Lab 10/26/21 0220 10/26/21 0736  10/26/21 1149 10/26/21 1644 10/26/21 2104  GLUCAP 124* 66* 49* 118* 152*    Discharge time spent: greater than 30 minutes.  Signed: Kayleen Memos, DO [facility] 11/01/2021

## 2021-10-31 NOTE — Plan of Care (Signed)
  Problem: Activity: Goal: Risk for activity intolerance will decrease Outcome: Progressing   Problem: Coping: Goal: Level of anxiety will decrease Outcome: Progressing   Problem: Elimination: Goal: Will not experience complications related to bowel motility Outcome: Progressing   

## 2021-10-31 NOTE — TOC Progression Note (Signed)
Transition of Care (TOC) - Progression Note  ? ? ?Patient Details  ?Name: Manning Clere ?MRN: [id number] ?Date of Birth: 04/14/1971 ? ?Transition of Care (TOC) CM/SW Contact  ?Purcell Mouton, RN ?Phone Number: ?10/31/2021, 9:35 AM ? ?Clinical Narrative:    ? ?[facility] rep was called and will follow up with [facility] for a bed.  ? ?  ?  ? ?Expected Discharge Plan and Services ?  ?  ?  ?  ?  ?Expected Discharge Date: 10/31/21               ?  ?  ?  ?  ?  ?  ?  ?  ?  ?  ? ? ?Social Determinants of Health (SDOH) Interventions ?  ? ?Readmission Risk Interventions ?No flowsheet data found. ? ?

## 2021-11-01 ENCOUNTER — Encounter (HOSPITAL_COMMUNITY): Payer: Self-pay

## 2021-11-01 ENCOUNTER — Ambulatory Visit (HOSPITAL_COMMUNITY): Admit: 2021-11-01 | Payer: BC Managed Care – PPO | Admitting: Gastroenterology

## 2021-11-01 SURGERY — ESOPHAGOGASTRODUODENOSCOPY (EGD) WITH PROPOFOL
Anesthesia: Monitor Anesthesia Care

## 2021-11-01 NOTE — Progress Notes (Signed)
Patient was seen at his bedside.  There were no acute events overnight.  Patient has been cleared for discharge from Dr. Dwyane Dee, [facility], since 10/31/2021, please refer to discharge summary dictated by my partner Dr. Dwyane Dee on 10/31/2021.   ? ?Plan to discharge to [place] today.   ? ?Per hospital liaison, Roselee Nova, the patient has signed a consent for discharge to hospice care. ?

## 2021-11-01 NOTE — Progress Notes (Addendum)
Manufacturing engineer [facility]) Hospital Liaison Note ? ?[place] liaison, unit RN Latrice, and TOC Cookie met at the bedside to speak with patient regarding transferring to [facility] today.  ? ?[employer] interpreter used speaking patient's native language Kinyarwanda, per his request. [place] liaison explained about [facility] and what the criteria is to go. Patient verbalized understanding. His main focus is to have the "fluid removed from his belly." He understands [facility] is solely focused on comfort care.  ? ?Drain has been placed and canisters are at the bedside and will be transported with patient.  ? ?Consents signed by patient via docusign at the bedside.  ? ?TOC aware and patient will discharge today.  ? ?Please call with any questions or concerns. Thank you ? ?Roselee Nova, LCSW ?[facility] Liaison ?[phone] ?

## 2021-11-01 NOTE — Progress Notes (Signed)
Report called to [facility]. Spoke with Nash Mantis. IV in place for hospice facility. Plurex drain supplies sent with pt. Spoke with pt regarding decision and transfer via Marketing executive.  ?Jerene Pitch ? ?

## 2021-11-01 NOTE — TOC Progression Note (Signed)
Transition of Care (TOC) - Progression Note  ? ? ?Patient Details  ?Name: Patrick Tran ?MRN: [id number] ?Date of Birth: 1970/08/28 ? ?Transition of Care (TOC) CM/SW Contact  ?Purcell Mouton, RN ?Phone Number: ?11/01/2021, 1:21 PM ? ?Clinical Narrative:    ? ?Pt agreed to got to [facility]. Fabio Pierce, CSW with Jeris Penta, RN and CM spoke with pt via translator concerning Home or [employer].  ? ? ?  ?  ? ?Expected Discharge Plan and Services ?  ?  ?  ?  ?  ?Expected Discharge Date: 11/01/21               ?  ?  ?  ?  ?  ?  ?  ?  ?  ?  ? ? ?Social Determinants of Health (SDOH) Interventions ?  ? ?Readmission Risk Interventions ?No flowsheet data found. ? ?

## 2021-11-01 NOTE — Plan of Care (Signed)
  Problem: Safety: Goal: Ability to remain free from injury will improve Outcome: Progressing   Problem: Skin Integrity: Goal: Risk for impaired skin integrity will decrease Outcome: Progressing   

## 2021-11-09 ENCOUNTER — Other Ambulatory Visit: Payer: Self-pay | Admitting: Hematology

## 2021-11-25 DEATH — deceased

## 2023-07-03 IMAGING — US US PARACENTESIS
1 series · 5 of 5 positions shown · non-contrast
Comparison: none

INDICATION: Patient with history hepatocellular carcinoma with recurrent
ascites. Request is for therapeutic paracentesis.

[Series 1: us paracentesis · 5 of 5 slices shown]
[im 1/5]
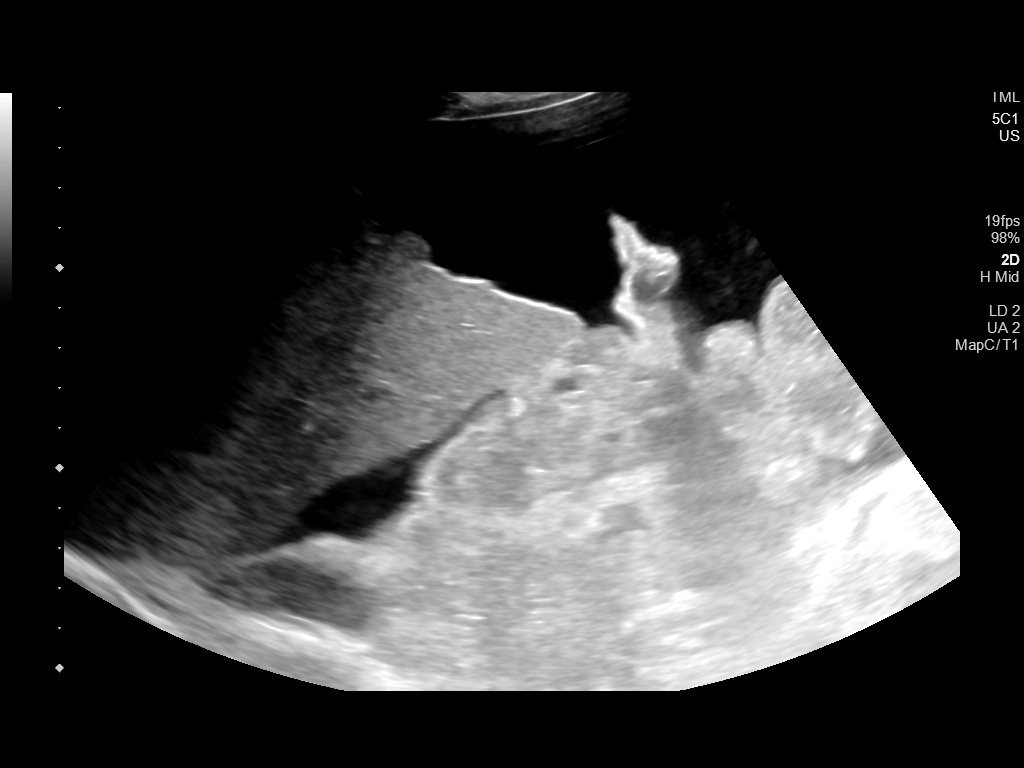
[im 2/5]
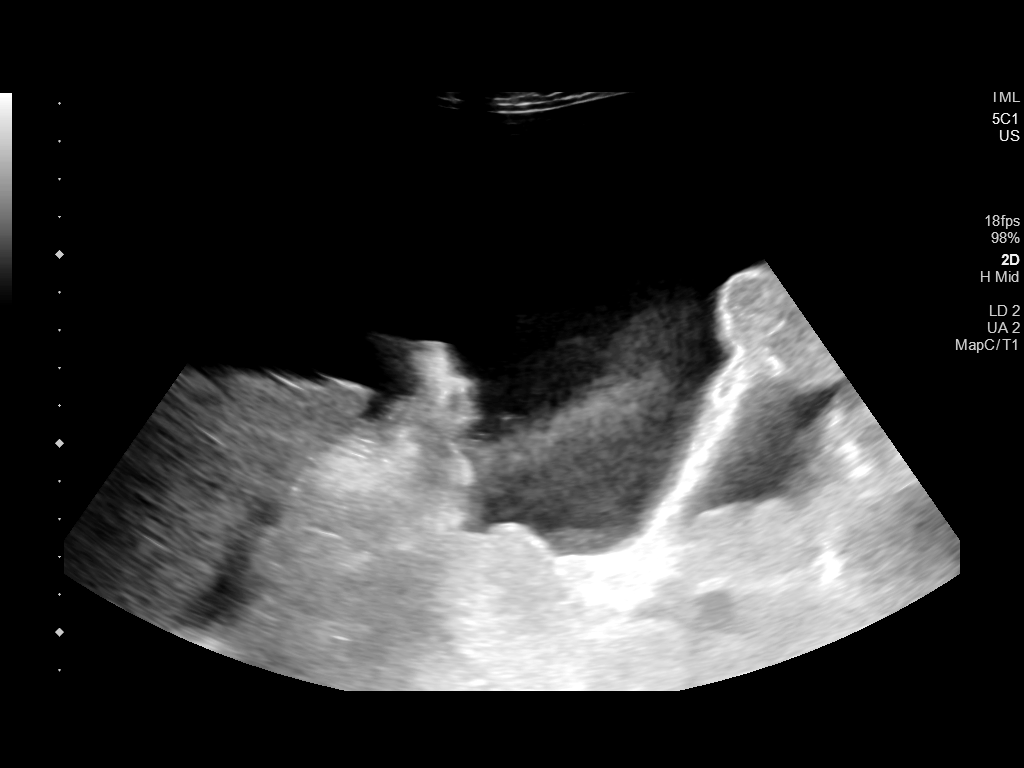
[im 3/5]
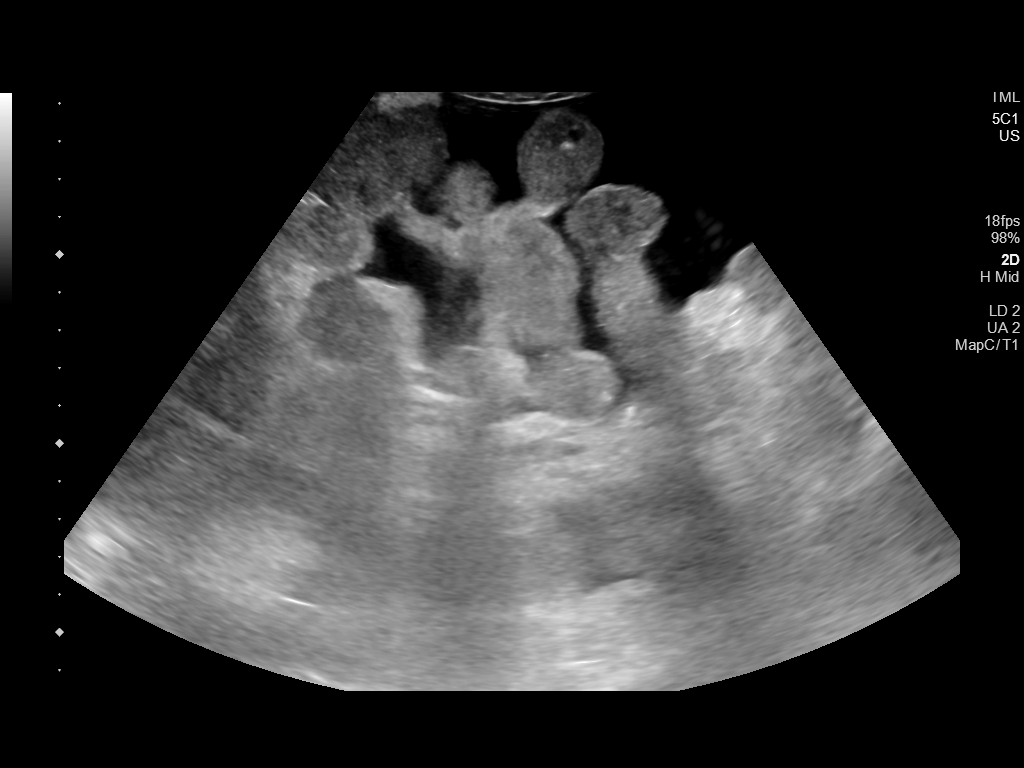
[im 4/5]
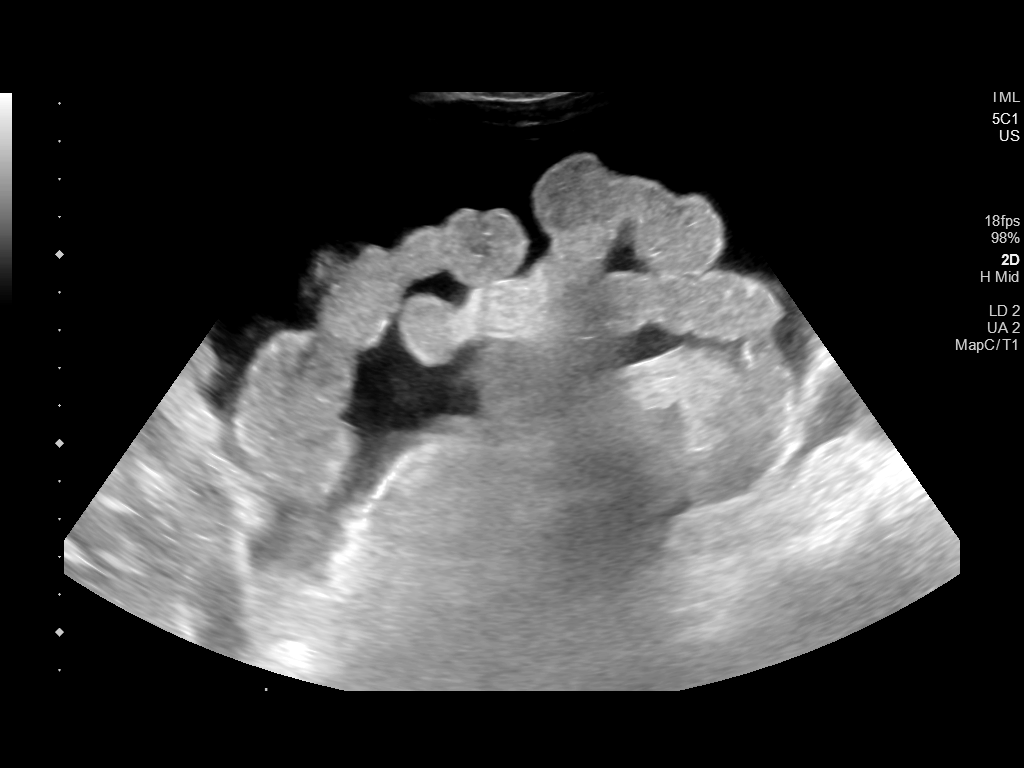
[im 5/5]
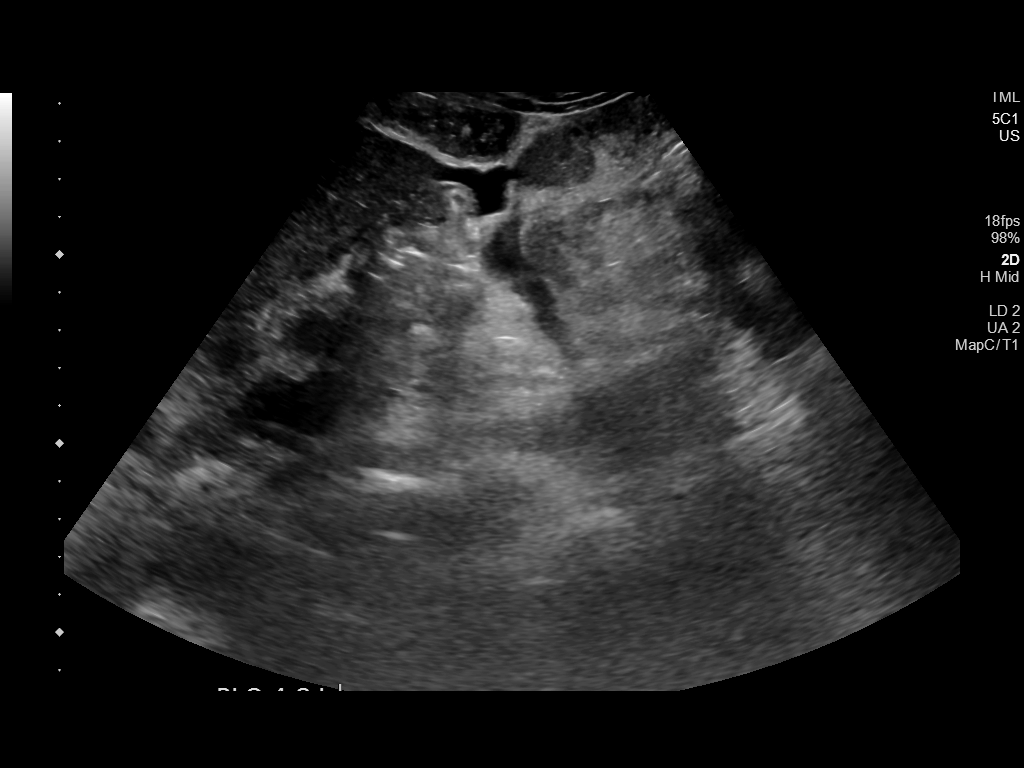

[5 of 5 positions shown; findings below may reference images not displayed]

EXAM:
ULTRASOUND GUIDED THERAPEUTIC  PARACENTESIS

MEDICATIONS:
Lidocaine 1% 10 mL

COMPLICATIONS:
None immediate.

PROCEDURE:
Informed written consent was obtained from the patient after a
discussion of the risks, benefits and alternatives to treatment. A
timeout was performed prior to the initiation of the procedure.

Initial ultrasound scanning demonstrates a moderate amount of
ascites within the right lower abdominal quadrant. The right lower
abdomen was prepped and draped in the usual sterile fashion. 1%
lidocaine was used for local anesthesia.

Following this, a 19 gauge, 7-cm, Yueh catheter was introduced. An
ultrasound image was saved for documentation purposes. The
paracentesis was performed. The catheter was removed and a dressing
was applied. The patient tolerated the procedure well without
immediate post procedural complication.
FINDINGS: A total of approximately 4.8 L of straw colored fluid was removed.
IMPRESSION: Successful ultrasound-guided paracentesis yielding 4.8 liters of
peritoneal fluid.

Read by: Ma Bo Tse Zitha, NP

## 2023-07-07 IMAGING — US US PARACENTESIS
1 series · 5 of 5 positions shown · non-contrast
Comparison: none

INDICATION: Patient with history of hepatocellular carcinoma, recurrent ascites.
Request for therapeutic paracentesis.

[Series 1: us paracentesis mc & wl · 5 of 5 slices shown]
[im 1/5]
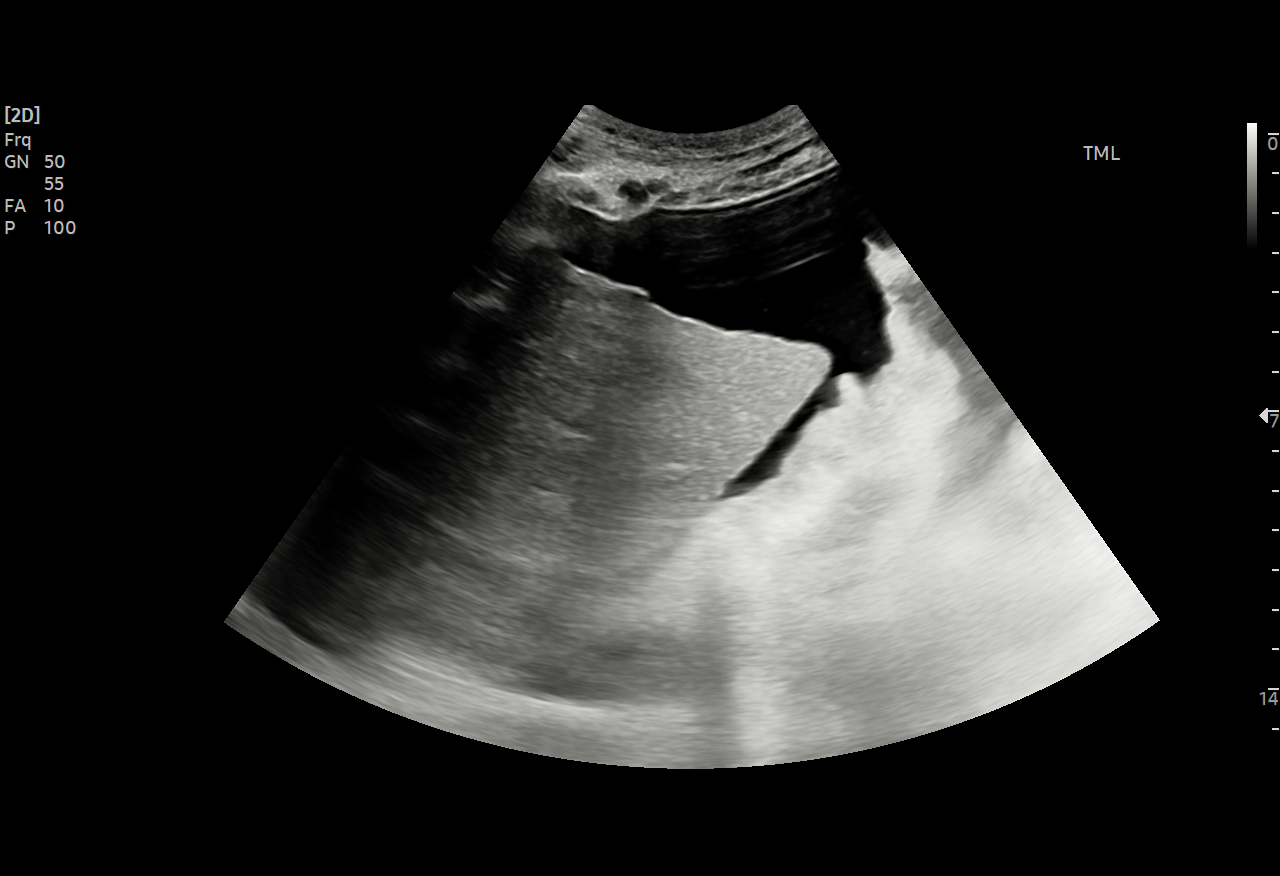
[im 2/5]
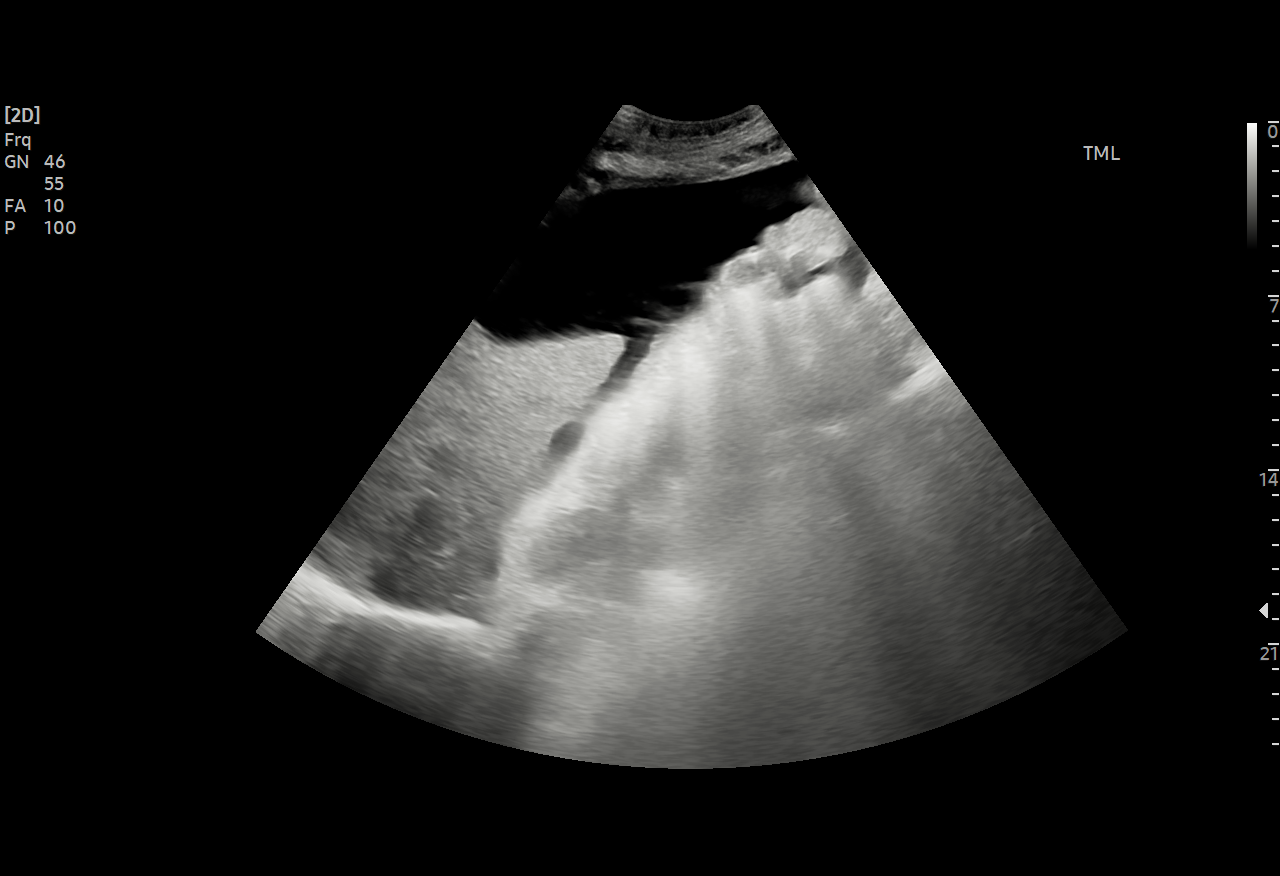
[im 3/5]
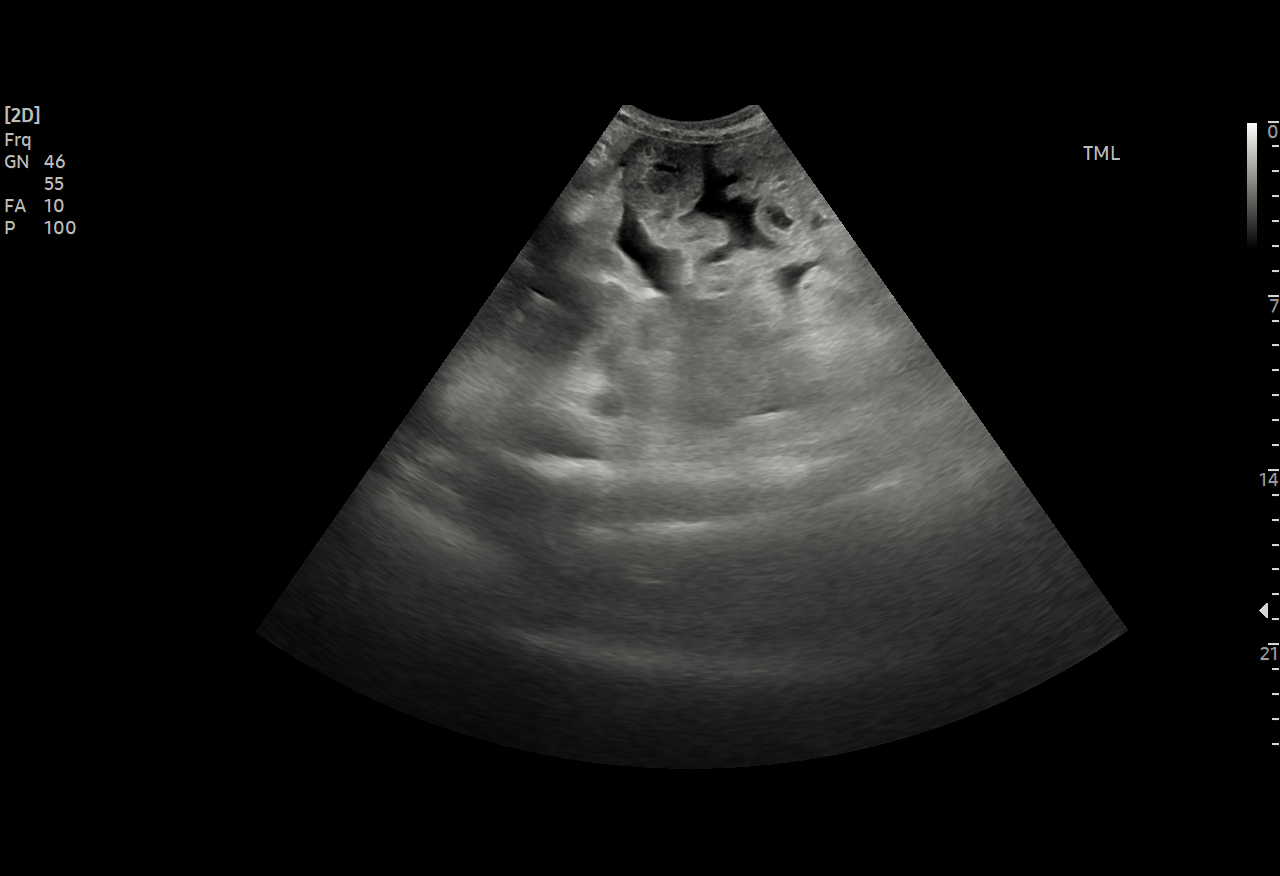
[im 4/5]
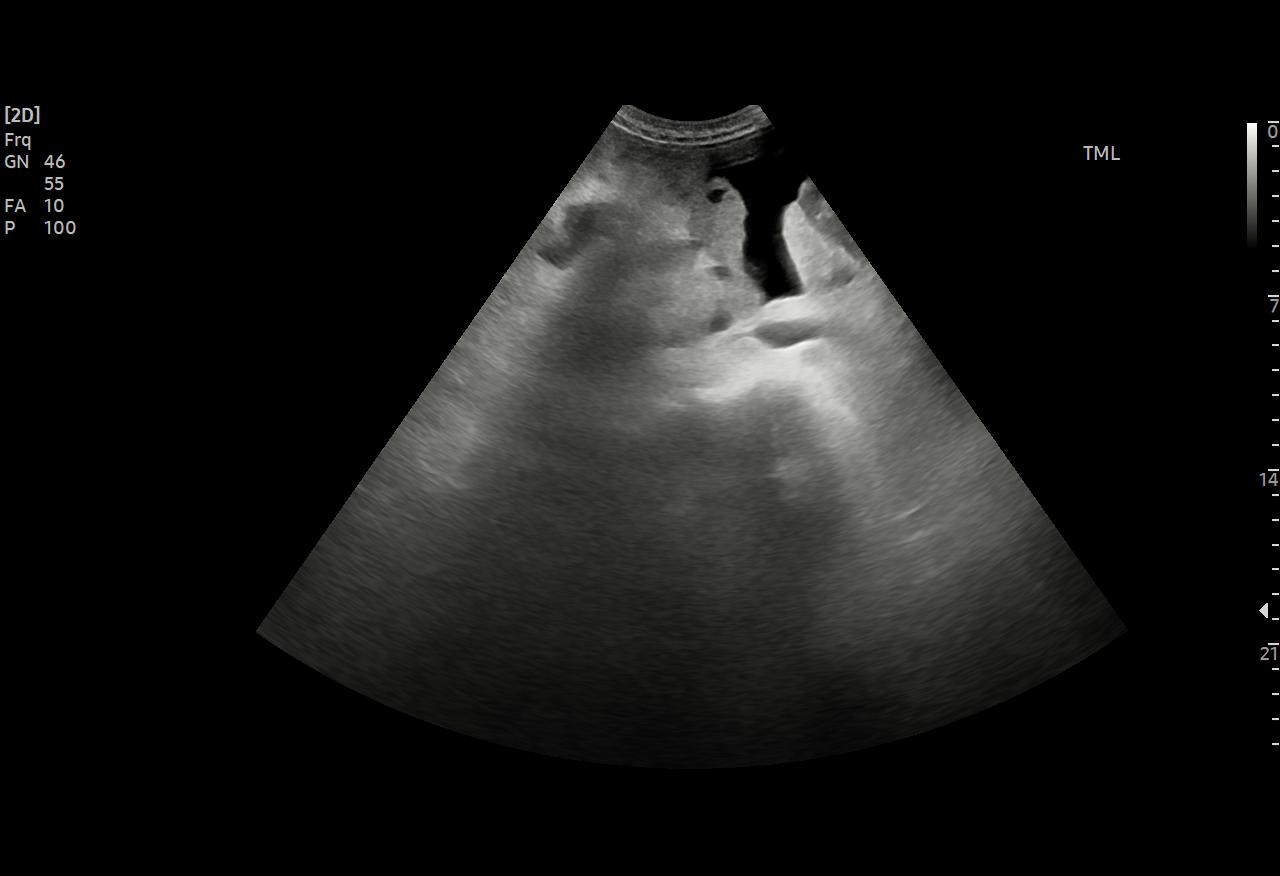
[im 5/5]
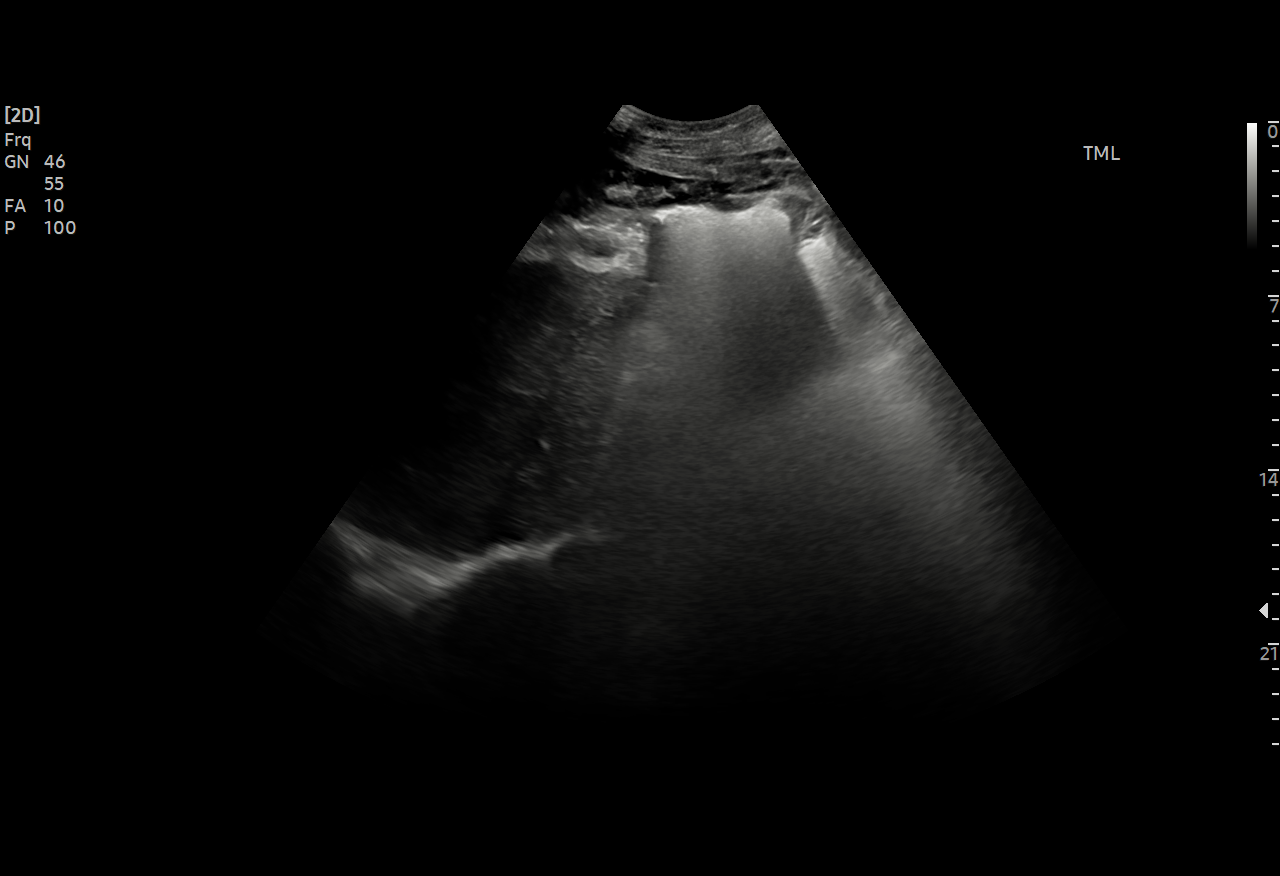

[5 of 5 positions shown; findings below may reference images not displayed]

EXAM:
ULTRASOUND GUIDED THERAPEUTIC PARACENTESIS

MEDICATIONS:
10 mL 1% lidocaine

COMPLICATIONS:
None immediate.

PROCEDURE:
Informed written consent was obtained from the patient after a
discussion of the risks, benefits and alternatives to treatment. A
timeout was performed prior to the initiation of the procedure.

Initial ultrasound scanning demonstrates a large amount of ascites
within the right lower abdominal quadrant. The right lower abdomen
was prepped and draped in the usual sterile fashion. 1% lidocaine
was used for local anesthesia.

Following this, a 19 gauge, 7-cm, Yueh catheter was introduced. An
ultrasound image was saved for documentation purposes. The
paracentesis was performed. The catheter was removed and a dressing
was applied. The patient tolerated the procedure well without
immediate post procedural complication.
FINDINGS: A total of approximately 4.0 L of clear yellow fluid was removed.
IMPRESSION: Successful ultrasound-guided paracentesis yielding 4.0 liters of
peritoneal fluid.

Read by Trapp, Kanika

## 2023-07-13 IMAGING — XA IR PERC TUN PERIT CATH W/PORT S&I /IMAG
1 series · 5 of 5 positions shown · non-contrast
Comparison: Multiple IR ultrasound, most recently 10/24/2021.

INDICATION: HCC with refractory ascites requiring frequent paracentesis.

EXAM:
ULTRASOUND AND FLUOROSCOPIC GUIDED PLACEMENT OF TUNNELED PERITONEAL
(PleurX) CATHETER
TECHNIQUE: Informed written consent was obtained from the the patient and/or
patient's representative after a discussion of the risks, benefits
and alternatives to treatment. Questions regarding the procedure
were encouraged and answered. A timeout was performed prior to the
initiation of the procedure.

[Series 1: ir rad eval and mgt. · 5 acquisitions, 5 frames shown]
[im 1/5]
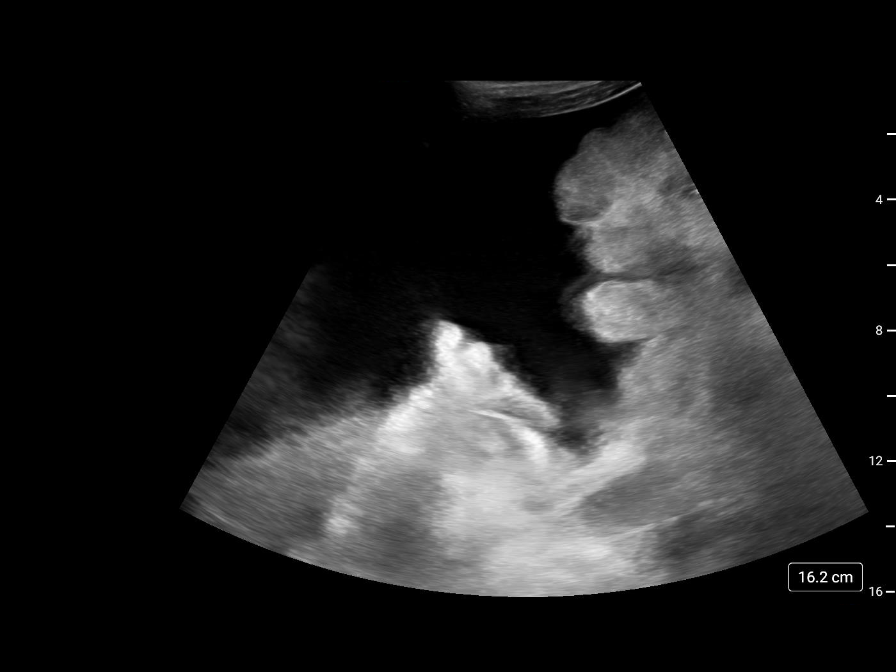
[im 2/5]
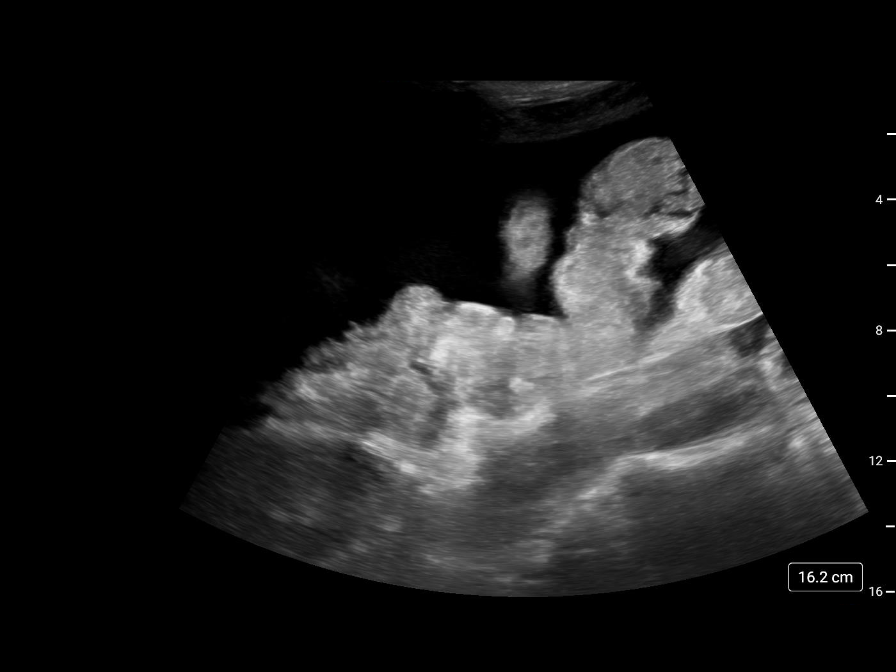
[im 3/5]
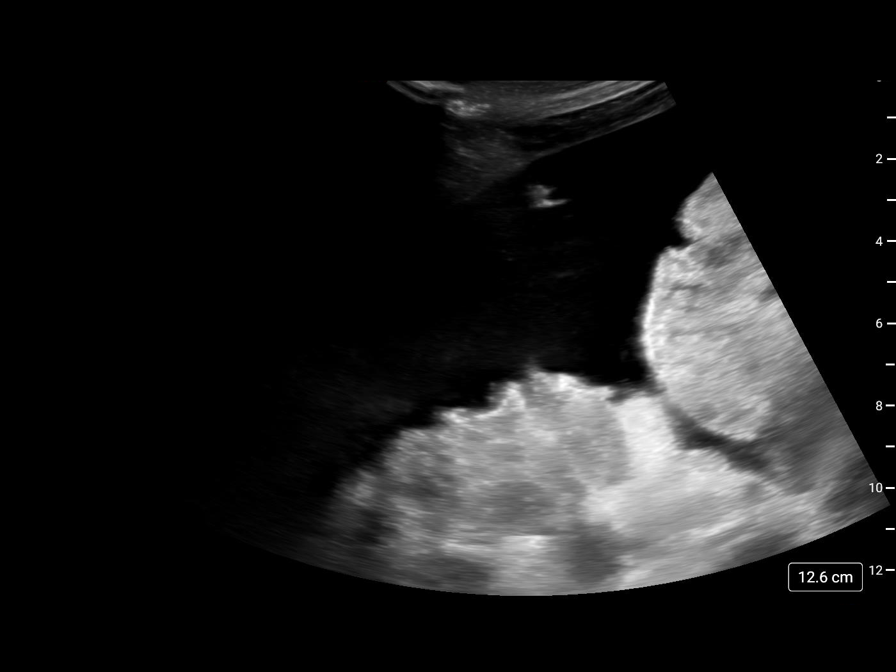
[im 4/5]
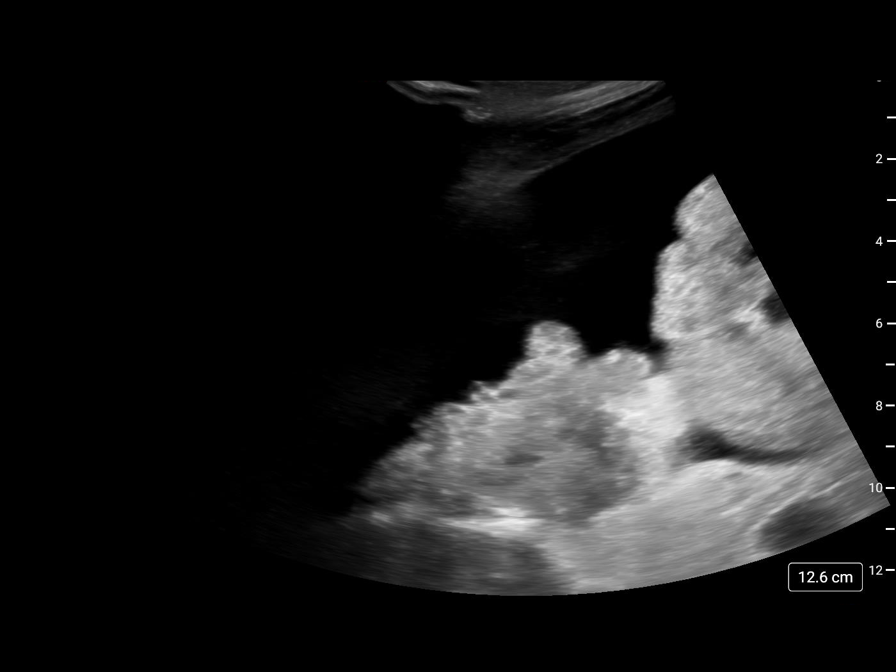
[im 5/5]
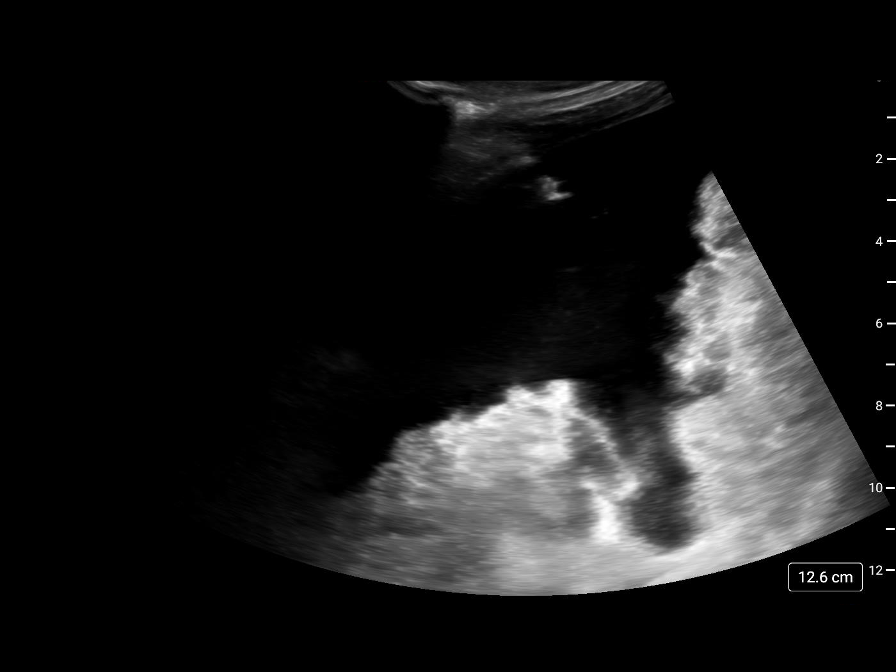

[5 of 5 positions shown; findings below may reference images not displayed]

MRI
abdomen, 09/08/2021.

MEDICATIONS:
Ancef 2 gm IV; Antibiotics were administered within an appropriate
time frame prior to skin puncture.

CONTRAST:  None.

ANESTHESIA/SEDATION:
Moderate (conscious) sedation was employed during this procedure. A
total of Versed 2 mg and Fentanyl 100 mcg was administered
intravenously.

Moderate Sedation Time: 16 minutes. The patient's level of
consciousness and vital signs were monitored continuously by
radiology nursing throughout the procedure under my direct
supervision.

FLUOROSCOPY TIME:  0 minutes 48 seconds (2 mGy)

COMPLICATIONS:
None immediate.
Ultrasound scanning of the RIGHT lower abdominal quadrant
demonstrates a moderate amount of complex fluid within the right
lower abdominal quadrant. The RIGHT lower abdomen was prepped and
draped usual sterile fashion a sterile drape was applied, covering
the operative table. Maximum barrier sterile technique with sterile
gowns and gloves were used for the procedure. A timeout was
performed prior to the initiation of the procedure. Local anesthesia
was provided with 1% lidocaine with epinephrine.

Under direct ultrasound guidance, an 18 gauge Tarantino was advanced into
the peritoneal space with the RIGHT abdominal quadrant. Appropriate
positioning was confirmed with the efflux of ascites. An Amplatz
super stiff wire was advanced across the abdomen under intermittent
fluoroscopic guidance. The access site was dilated over the Amplatz
wire, allowing advancement of a peel-away sheath.

The peritoneal catheter was tunneled in an antegrade fashion from a
site along the right mid clavicular line to the access site and
inserted through the peel-away sheath with tip ultimately
terminating within LEFT lower quadrant. Several postprocedural spot
abdominal radiographs were obtained.

The catheter was connected to wall suction and approximately 3044 mL
of serous ascites was aspirated.

The access site was closed with an interrupted 4-0 Vicryl suture,
Dermabond and Mohozzo. Dressings were placed. The patient
tolerated procedure well without immediate postprocedural
complication.
FINDINGS: After successful ultrasound and fluoroscopic guided peritoneal
drainage catheter placement, the RIGHT lower quadrant abdominal
approach drainage catheter terminates within the LEFT lower quadrant

Following tunneled peritoneal drainage catheter placement,
approximately 3044 mL of serous fluid was aspirated.
IMPRESSION: Successful placement of a tunneled peritoneal drainage (PleurX)
catheter, as above, followed by therapeutic paracentesis with 3044
mL of ascites aspirated.
